# Patient Record
Sex: Female | Born: 1990 | Race: White | Hispanic: No
Health system: Midwestern US, Community
[De-identification: ages and names within clinical notes are randomized; demographics above are authoritative.]

## PROBLEM LIST (undated history)

## (undated) DIAGNOSIS — F419 Anxiety disorder, unspecified: Secondary | ICD-10-CM

## (undated) DIAGNOSIS — R569 Unspecified convulsions: Secondary | ICD-10-CM

## (undated) DIAGNOSIS — T7840XA Allergy, unspecified, initial encounter: Secondary | ICD-10-CM

## (undated) DIAGNOSIS — G43909 Migraine, unspecified, not intractable, without status migrainosus: Secondary | ICD-10-CM

## (undated) DIAGNOSIS — L309 Dermatitis, unspecified: Secondary | ICD-10-CM

## (undated) DIAGNOSIS — M797 Fibromyalgia: Secondary | ICD-10-CM

## (undated) DIAGNOSIS — F32A Depression, unspecified: Secondary | ICD-10-CM

## (undated) DIAGNOSIS — F909 Attention-deficit hyperactivity disorder, unspecified type: Secondary | ICD-10-CM

## (undated) DIAGNOSIS — F329 Major depressive disorder, single episode, unspecified: Secondary | ICD-10-CM

## (undated) DIAGNOSIS — I10 Essential (primary) hypertension: Secondary | ICD-10-CM

## (undated) HISTORY — PX: TONSILLECTOMY AND ADENOIDECTOMY: SHX28

## (undated) HISTORY — DX: Anxiety disorder, unspecified: F41.9

## (undated) HISTORY — DX: Fibromyalgia: M79.7

## (undated) HISTORY — DX: Attention-deficit hyperactivity disorder, unspecified type: F90.9

## (undated) HISTORY — DX: Unspecified convulsions: R56.9

## (undated) HISTORY — DX: Dermatitis, unspecified: L30.9

## (undated) HISTORY — PX: ESOPHAGOGASTRODUODENOSCOPY: SHX1529

## (undated) HISTORY — DX: Migraine, unspecified, not intractable, without status migrainosus: G43.909

## (undated) HISTORY — DX: Allergy, unspecified, initial encounter: T78.40XA

## (undated) HISTORY — DX: Major depressive disorder, single episode, unspecified: F32.9

## (undated) HISTORY — DX: Depression, unspecified: F32.A

---
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
## 2013-04-02 ENCOUNTER — Ambulatory Visit (INDEPENDENT_AMBULATORY_CARE_PROVIDER_SITE_OTHER): Payer: BC Managed Care – PPO | Admitting: Family Medicine

## 2013-04-02 ENCOUNTER — Ambulatory Visit: Payer: BC Managed Care – PPO

## 2013-04-02 VITALS — BP 110/60 | HR 84 | Temp 98.0°F | Resp 16 | Ht 58.5 in | Wt 106.4 lb

## 2013-04-02 DIAGNOSIS — R05 Cough: Secondary | ICD-10-CM

## 2013-04-02 DIAGNOSIS — R062 Wheezing: Secondary | ICD-10-CM

## 2013-04-02 DIAGNOSIS — R059 Cough, unspecified: Secondary | ICD-10-CM

## 2013-04-02 DIAGNOSIS — F17200 Nicotine dependence, unspecified, uncomplicated: Secondary | ICD-10-CM

## 2013-04-02 DIAGNOSIS — J209 Acute bronchitis, unspecified: Secondary | ICD-10-CM

## 2013-04-02 LAB — POCT URINE PREGNANCY: Preg Test, Ur: NEGATIVE

## 2013-04-02 MED ORDER — ALBUTEROL SULFATE HFA 108 (90 BASE) MCG/ACT IN AERS: 2.0000 | INHALATION_SPRAY | Freq: Four times a day (QID) | RESPIRATORY_TRACT | Status: DC | PRN

## 2013-04-02 MED ORDER — AZITHROMYCIN 250 MG PO TABS: ORAL_TABLET | ORAL | Status: DC

## 2013-04-02 MED ORDER — IPRATROPIUM BROMIDE 0.02 % IN SOLN: 0.5000 mg | Freq: Once | RESPIRATORY_TRACT | Status: DC

## 2013-04-02 MED ORDER — BENZONATATE 100 MG PO CAPS: 100.0000 mg | ORAL_CAPSULE | Freq: Two times a day (BID) | ORAL | Status: DC | PRN

## 2013-04-02 MED ORDER — HYDROCODONE-HOMATROPINE 5-1.5 MG/5ML PO SYRP: 5.0000 mL | ORAL_SOLUTION | Freq: Every evening | ORAL | Status: DC | PRN

## 2013-04-02 MED ORDER — ALBUTEROL SULFATE (2.5 MG/3ML) 0.083% IN NEBU: 2.5000 mg | INHALATION_SOLUTION | Freq: Once | RESPIRATORY_TRACT | Status: DC

## 2013-04-02 NOTE — Progress Notes (Signed)
[facility]:  Office Visit  Chief Complaint:  Chief Complaint  Patient presents with  . Cough    x 5 days  . Sore Throat    HPI: Misty Ross is a 22 y.o. female who is here for  5 day history of sore throat , chills, muscle aches, subjective fevers,  She is a long time smoker since the age of 88. She has  seasonal allergies, has taken cough drops and Delsyn without relief.  She has also taken sleep medicines but still wakes up coughing, Denies asthma. + rumbling in her chest. Nonproductive cough, just a lot of rumbling and rattles when she coughs. No facial pain, no ear pain, + rhinorrhea.   Past Medical History  Diagnosis Date  . Allergy   . Anxiety   . Depression   . Seizures    Past Surgical History  Procedure Laterality Date  . Tonsillectomy and adenoidectomy     History   Social History  . Marital Status: Single    Spouse Name: N/A    Number of Children: N/A  . Years of Education: N/A   Social History Main Topics  . Smoking status: Current Every Day Smoker  . Smokeless tobacco: Not on file  . Alcohol Use: Not on file  . Drug Use: Not on file  . Sexual Activity: Not on file   Other Topics Concern  . Not on file   Social History Narrative  . No narrative on file   Family History  Problem Relation Age of Onset  . Hyperlipidemia Father   . Cancer Maternal Grandmother   . Diabetes Maternal Grandmother   . Cancer Maternal Grandfather   . Heart disease Paternal Grandmother   . Cancer Paternal Grandfather    No Known Allergies Prior to Admission medications   Not on File     ROS: The patient denies fevers, chills, night sweats, unintentional weight loss, chest pain, palpitations, dyspnea on exertion, nausea, vomiting, abdominal pain, dysuria, hematuria, melena, numbness, weakness, or tingling.   All other systems have been reviewed and were otherwise negative with the exception of those mentioned in the HPI and as above.    PHYSICAL  EXAM: Filed Vitals:   04/02/13 1520  BP: 110/60  Pulse: 84  Temp: 98 F (36.7 C)  Resp: 16  Spo2 99% Filed Vitals:   04/02/13 1520  Height: 4' 10.5" (1.486 m)  Weight: 106 lb 6.4 oz (48.263 kg)   Body mass index is 21.86 kg/(m^2).  General: Alert, no acute distress HEENT:  Normocephalic, atraumatic, oropharynx patent. EOMI, PERRLA, TM nl, no exudates, nontender sinuses Cardiovascular:  Regular rate and rhythm, no rubs murmurs or gallops.  No Carotid bruits, radial pulse intact. No pedal edema.  Respiratory: Clear to auscultation bilaterally.  No wheezes, rales, + rhonchi.  No cyanosis, no use of accessory musculature GI: No organomegaly, abdomen is soft and non-tender, positive bowel sounds.  No masses. Skin: No rashes. Neurologic: Facial musculature symmetric. Psychiatric: Patient is appropriate throughout our interaction. Lymphatic: No cervical lymphadenopathy Musculoskeletal: Gait intact.   LABS: Results for orders placed in visit on 04/02/13  POCT URINE PREGNANCY      Result Value Range   Preg Test, Ur Negative       EKG/XRAY:   Primary read interpreted by Dr. Conley Rolls at [facility]. No pneumonia, effusion Increase vascular markings  ASSESSMENT/PLAN: Encounter Diagnoses  Name Primary?  . Cough Yes  . Acute bronchitis   . Wheezing  Rx Azithromycin Rx Albuterol inhaler MDI, no prednisone for now, she has more rhoncherous BS than actual wheezing  Rx Hycodan and tessalon perles Advise to stop smoking Take otc Anithistamine with decongestant  F/u prn  Gross sideeffects, risk and benefits, and alternatives of medications d/w patient. Patient is aware that all medications have potential sideeffects and we are unable to predict every sideeffect or drug-drug interaction that may occur.  Hamilton Capri PHUONG, DO 04/02/2013 4:50 PM

## 2013-04-02 NOTE — Patient Instructions (Signed)
Acute Bronchitis Bronchitis is inflammation of the airways that extend from the windpipe into the lungs (bronchi). The inflammation often causes mucus to develop. This leads to a cough, which is the most common symptom of bronchitis.  In acute bronchitis, the condition usually develops suddenly and goes away over time, usually in a couple weeks. Smoking, allergies, and asthma can make bronchitis worse. Repeated episodes of bronchitis may cause further lung problems.  CAUSES Acute bronchitis is most often caused by the same virus that causes a cold. The virus can spread from person to person (contagious).  SIGNS AND SYMPTOMS   Cough.   Fever.   Coughing up mucus.   Body aches.   Chest congestion.   Chills.   Shortness of breath.   Sore throat.  DIAGNOSIS  Acute bronchitis is usually diagnosed through a physical exam. Tests, such as chest X-rays, are sometimes done to rule out other conditions.  TREATMENT  Acute bronchitis usually goes away in a couple weeks. Often times, no medical treatment is necessary. Medicines are sometimes given for relief of fever or cough. Antibiotics are usually not needed but may be prescribed in certain situations. In some cases, an inhaler may be recommended to help reduce shortness of breath and control the cough. A cool mist vaporizer may also be used to help thin bronchial secretions and make it easier to clear the chest.  HOME CARE INSTRUCTIONS  Get plenty of rest.   Drink enough fluids to keep your urine clear or pale yellow (unless you have a medical condition that requires fluid restriction). Increasing fluids may help thin your secretions and will prevent dehydration.   Only take over-the-counter or prescription medicines as directed by your health care provider.   Avoid smoking and secondhand smoke. Exposure to cigarette smoke or irritating chemicals will make bronchitis worse. If you are a smoker, consider using nicotine gum or skin  patches to help control withdrawal symptoms. Quitting smoking will help your lungs heal faster.   Reduce the chances of another bout of acute bronchitis by washing your hands frequently, avoiding people with cold symptoms, and trying not to touch your hands to your mouth, nose, or eyes.   Follow up with your health care provider as directed.  SEEK MEDICAL CARE IF: Your symptoms do not improve after 1 week of treatment.  SEEK IMMEDIATE MEDICAL CARE IF:  You develop an increased fever or chills.   You have chest pain.   You have severe shortness of breath.  You have bloody sputum.   You develop dehydration.  You develop fainting.  You develop repeated vomiting.  You develop a severe headache. MAKE SURE YOU:   Understand these instructions.  Will watch your condition.  Will get help right away if you are not doing well or get worse. Document Released: 06/15/2004 Document Revised: 01/08/2013 Document Reviewed: 10/29/2012 [employer] Patient Information 2014 [employer].  

## 2013-04-03 ENCOUNTER — Encounter: Payer: Self-pay | Admitting: Family Medicine

## 2013-04-15 ENCOUNTER — Emergency Department (HOSPITAL_COMMUNITY)
Admission: EM | Admit: 2013-04-15 | Discharge: 2013-04-16 | Disposition: A | Discharge status: Home / Self Care | Payer: BC Managed Care – PPO | Attending: Emergency Medicine | Admitting: Emergency Medicine

## 2013-04-15 ENCOUNTER — Encounter (HOSPITAL_COMMUNITY): Payer: Self-pay | Admitting: Emergency Medicine

## 2013-04-15 DIAGNOSIS — T148XXA Other injury of unspecified body region, initial encounter: Secondary | ICD-10-CM

## 2013-04-15 DIAGNOSIS — F3289 Other specified depressive episodes: Secondary | ICD-10-CM | POA: Insufficient documentation

## 2013-04-15 DIAGNOSIS — Y9389 Activity, other specified: Secondary | ICD-10-CM | POA: Insufficient documentation

## 2013-04-15 DIAGNOSIS — F329 Major depressive disorder, single episode, unspecified: Secondary | ICD-10-CM | POA: Insufficient documentation

## 2013-04-15 DIAGNOSIS — R5381 Other malaise: Secondary | ICD-10-CM | POA: Insufficient documentation

## 2013-04-15 DIAGNOSIS — F411 Generalized anxiety disorder: Secondary | ICD-10-CM | POA: Insufficient documentation

## 2013-04-15 DIAGNOSIS — F17200 Nicotine dependence, unspecified, uncomplicated: Secondary | ICD-10-CM | POA: Insufficient documentation

## 2013-04-15 DIAGNOSIS — Z79899 Other long term (current) drug therapy: Secondary | ICD-10-CM | POA: Insufficient documentation

## 2013-04-15 DIAGNOSIS — S5010XA Contusion of unspecified forearm, initial encounter: Secondary | ICD-10-CM | POA: Insufficient documentation

## 2013-04-15 DIAGNOSIS — R51 Headache: Secondary | ICD-10-CM | POA: Insufficient documentation

## 2013-04-15 DIAGNOSIS — Y92410 Unspecified street and highway as the place of occurrence of the external cause: Secondary | ICD-10-CM | POA: Insufficient documentation

## 2013-04-15 DIAGNOSIS — S060X0A Concussion without loss of consciousness, initial encounter: Secondary | ICD-10-CM | POA: Insufficient documentation
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
## 2013-04-17 NOTE — ED Provider Notes (Signed)
Medical screening examination/treatment/procedure(s) were performed by non-physician practitioner and as supervising physician I was immediately available for consultation/collaboration.  EKG Interpretation   None         Candyce Churn, MD 04/17/13 2032

## 2013-07-12 ENCOUNTER — Ambulatory Visit (INDEPENDENT_AMBULATORY_CARE_PROVIDER_SITE_OTHER): Payer: BC Managed Care – PPO | Admitting: Family Medicine

## 2013-07-12 VITALS — BP 108/82 | HR 102 | Temp 98.2°F | Resp 18 | Ht 59.0 in | Wt 102.4 lb

## 2013-07-12 DIAGNOSIS — R05 Cough: Secondary | ICD-10-CM

## 2013-07-12 DIAGNOSIS — J111 Influenza due to unidentified influenza virus with other respiratory manifestations: Secondary | ICD-10-CM

## 2013-07-12 DIAGNOSIS — R059 Cough, unspecified: Secondary | ICD-10-CM

## 2013-07-12 DIAGNOSIS — J310 Chronic rhinitis: Secondary | ICD-10-CM

## 2013-07-12 MED ORDER — IPRATROPIUM BROMIDE 0.02 % IN SOLN
0.5000 mg | Freq: Once | RESPIRATORY_TRACT | Status: DC
Start: 2013-07-12 — End: 2014-05-26

## 2013-07-12 MED ORDER — HYDROCODONE-HOMATROPINE 5-1.5 MG/5ML PO SYRP: 5.0000 mL | ORAL_SOLUTION | ORAL | Status: DC | PRN

## 2013-07-12 MED ORDER — OSELTAMIVIR PHOSPHATE 75 MG PO CAPS: 75.0000 mg | ORAL_CAPSULE | Freq: Two times a day (BID) | ORAL | Status: DC

## 2013-07-12 NOTE — Progress Notes (Signed)
Subjective:   23 year old lady who has been sick since yesterday. She got a bad sore throat and runny nose cough and body aches and just feels lousy. She may or may not have had fever. She did not have a flu shot.  Objective: Ill appearing. TMs normal. Throat clear. Neck supple without nodes. Chest clear to auscultation. Heart regular without murmurs. She has constant sniffles and some cough.  Assessment: Influenza Cough Rhinorrhea  Plan:  Tamiflu Hycodan cough syrup At the nasal Return as needed Off work for a couple of days

## 2013-07-12 NOTE — Patient Instructions (Signed)
Drink plenty of fluids and get enough rest  Use the Atrovent nasal nose spray 2 sprays each nostril 4 times daily as needed for nasal congestion  Take the cough syrup 1 teaspoon every 4-6 hours as needed for cough  Take the Tamiflu one twice daily for influenza infection  Return if worse

## 2013-07-15 ENCOUNTER — Ambulatory Visit (INDEPENDENT_AMBULATORY_CARE_PROVIDER_SITE_OTHER): Payer: BC Managed Care – PPO | Admitting: Family Medicine

## 2013-07-15 VITALS — BP 100/70 | HR 91 | Temp 97.8°F | Resp 18 | Ht 59.0 in | Wt 106.4 lb

## 2013-07-15 DIAGNOSIS — F909 Attention-deficit hyperactivity disorder, unspecified type: Secondary | ICD-10-CM

## 2013-07-15 MED ORDER — AMPHETAMINE-DEXTROAMPHETAMINE 10 MG PO TABS: 10.0000 mg | ORAL_TABLET | Freq: Every day | ORAL | Status: DC

## 2013-07-15 MED ORDER — LISDEXAMFETAMINE DIMESYLATE 30 MG PO CAPS: 30.0000 mg | ORAL_CAPSULE | Freq: Every day | ORAL | Status: DC

## 2013-07-15 NOTE — Progress Notes (Signed)
Subjective:  This chart was scribed for Nilda Simmer, MD by Carl Best, Medical Scribe. This patient was seen in Room 10 and the patient's care was started at 6:40 PM.   Patient ID: Misty Ross, female    DOB: Dec 24, 1990, 23 y.o.   MRN: [id number]  HPI HPI Comments: Misty Ross is a 23 y.o. female with a history of Anxiety and Depression and ADHD who presents to the [facility] needing a refill for her Vyvanse and Adderall.  The patient states that she was unable to see her psychiatrist this week and was not able to refill her medications.  The patient's psychiatrist is Dr. Saul Fordyce.  She states that she sees her for Insomnia and ADHD.  She states that she was diagnosed with ADHD when she was 23 years old.  She states that she is comfortable with her current dosage of Adderall and Vyvanse.  The patient's Vyvanse and Adderall were last filled on 06/05/2013.  She states that she takes the Adderall in the evening.  She states that she is not on any medication for anxiety or depression at the current moment.  She denies experiencing any major depressive episodes lately and states that she talks to her psychiatrist regularly.  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  She also takes Clonidine for insomnia but remaining refills on this medication. She was to see psychiatrist today but appointment was cancelled by psychiatry office due to physician being ill.  Appointment is not until next week.   Past Medical History  Diagnosis Date  . Allergy   . Anxiety   . Depression   . Seizures   . ADHD (attention deficit hyperactivity disorder)    Past Surgical History  Procedure Laterality Date  . Tonsillectomy and adenoidectomy     Family History  Problem Relation Age of Onset  . Hyperlipidemia Father   . Cancer Maternal Grandmother   . Diabetes Maternal Grandmother   . Cancer Maternal Grandfather   . Heart disease Paternal Grandmother   . Cancer  Paternal Grandfather    History   Social History  . Marital Status: Single    Spouse Name: N/A    Number of Children: N/A  . Years of Education: N/A   Occupational History  . Not on file.   Social History Main Topics  . Smoking status: Current Every Day Smoker -- 0.50 packs/day    Types: Cigarettes  . Smokeless tobacco: Not on file  . Alcohol Use: Yes     Comment: one beer per day  . Drug Use: Not on file  . Sexual Activity: Not on file   Other Topics Concern  . Not on file   Social History Narrative  . No narrative on file   No Known Allergies  Review of Systems  Respiratory: Negative for shortness of breath.   Cardiovascular: Negative for palpitations.  Neurological: Negative for headaches.  Psychiatric/Behavioral: Positive for sleep disturbance and decreased concentration. Negative for suicidal ideas, self-injury, dysphoric mood and agitation. The patient is not nervous/anxious.      Objective:  Physical Exam  Nursing note and vitals reviewed. Constitutional: She is oriented to person, place, and time. She appears well-developed and well-nourished.  HENT:  Head: Normocephalic and atraumatic.  Eyes: EOM are normal.  Neck: Normal range of motion.  Cardiovascular: Normal rate, regular rhythm and normal heart sounds.   No murmur heard. Pulmonary/Chest: Effort normal and breath sounds normal.  No respiratory distress. She has no wheezes. She has no rales.  Musculoskeletal: Normal range of motion.  Neurological: She is alert and oriented to person, place, and time.  Skin: Skin is warm and dry.  Psychiatric: Her speech is normal and behavior is normal. Judgment and thought content normal. Her mood appears anxious. Cognition and memory are normal.    BP 100/70  Pulse 91  Temp(Src) 97.8 F (36.6 C) (Oral)  Resp 18  Ht 4\' 11"  (1.499 m)  Wt 106 lb 6.4 oz (48.263 kg)  BMI 21.48 kg/m2  SpO2 97%  LMP 06/19/2013 Assessment & Plan:  ADHD (attention deficit  hyperactivity disorder)  1. ADHD:  New to this provider. Managed by psychiatry/[facility].  Advised patient that it is not our usual office policy to prescribe controlled substances to patients who receive medications from other health care providers; agreed to refill Vyvanse and Adderall one time only.  We will not provide with further refills in the future.  Patient expressed understanding.    Meds ordered this encounter  Medications  . lisdexamfetamine (VYVANSE) 30 MG capsule    Sig: Take 1 capsule (30 mg total) by mouth daily.    Dispense:  30 capsule    Refill:  0  . amphetamine-dextroamphetamine (ADDERALL) 10 MG tablet    Sig: Take 1 tablet (10 mg total) by mouth daily with breakfast.    Dispense:  30 tablet    Refill:  0    I personally performed the services described in this documentation, which was scribed in my presence. The recorded information has been reviewed and is accurate.  Nilda SimmerKristi Smith, M.D.  [facility]  [place] [address] [place], [place]  [id number] [phone] phone [phone] fax

## 2013-07-18 ENCOUNTER — Telehealth: Payer: Self-pay

## 2013-07-18 DIAGNOSIS — R05 Cough: Secondary | ICD-10-CM

## 2013-07-18 DIAGNOSIS — R059 Cough, unspecified: Secondary | ICD-10-CM

## 2013-07-18 NOTE — Telephone Encounter (Signed)
Patient was told by her doctor to call and remind him to write her cough medication prescription.   She can come and pick it up.   [phone]

## 2013-07-18 NOTE — Telephone Encounter (Signed)
Pt is in the office and would like to authorize for her boyfriend Teodoro SprayJacob Young to pick rx up.

## 2013-07-19 MED ORDER — HYDROCODONE-HOMATROPINE 5-1.5 MG/5ML PO SYRP: 5.0000 mL | ORAL_SOLUTION | Freq: Four times a day (QID) | ORAL | Status: DC | PRN

## 2013-07-19 NOTE — Telephone Encounter (Signed)
The original message was supposed to go to Dr. Alwyn RenHopper.  She is in dire need for something to stop her cough at night.  She says it has been days since she has slept.  Says this was discussed with Dr. Alwyn RenHopper on 07-12-13.  She is having to work and is having a hard time with no sleep.  Please call asap [phone]

## 2013-07-19 NOTE — Telephone Encounter (Signed)
Dr. Alwyn RenHopper not in office; refill of Hycodan provided for patient.

## 2014-03-31 ENCOUNTER — Emergency Department (HOSPITAL_COMMUNITY)
Admission: EM | Admit: 2014-03-31 | Discharge: 2014-03-31 | Disposition: A | Discharge status: Home / Self Care | Payer: BC Managed Care – PPO | Attending: Emergency Medicine | Admitting: Emergency Medicine

## 2014-03-31 ENCOUNTER — Encounter (HOSPITAL_COMMUNITY): Payer: Self-pay | Admitting: *Deleted

## 2014-03-31 ENCOUNTER — Emergency Department (HOSPITAL_COMMUNITY): Payer: BC Managed Care – PPO

## 2014-03-31 DIAGNOSIS — Y9289 Other specified places as the place of occurrence of the external cause: Secondary | ICD-10-CM | POA: Insufficient documentation

## 2014-03-31 DIAGNOSIS — F329 Major depressive disorder, single episode, unspecified: Secondary | ICD-10-CM | POA: Diagnosis not present

## 2014-03-31 DIAGNOSIS — R000 Tachycardia, unspecified: Secondary | ICD-10-CM | POA: Insufficient documentation

## 2014-03-31 DIAGNOSIS — Z720 Tobacco use: Secondary | ICD-10-CM | POA: Insufficient documentation

## 2014-03-31 DIAGNOSIS — T401X1A Poisoning by heroin, accidental (unintentional), initial encounter: Secondary | ICD-10-CM | POA: Diagnosis not present

## 2014-03-31 DIAGNOSIS — Z79899 Other long term (current) drug therapy: Secondary | ICD-10-CM | POA: Insufficient documentation

## 2014-03-31 DIAGNOSIS — F909 Attention-deficit hyperactivity disorder, unspecified type: Secondary | ICD-10-CM | POA: Insufficient documentation

## 2014-03-31 DIAGNOSIS — F419 Anxiety disorder, unspecified: Secondary | ICD-10-CM | POA: Diagnosis not present

## 2014-03-31 DIAGNOSIS — Y9389 Activity, other specified: Secondary | ICD-10-CM | POA: Insufficient documentation

## 2014-03-31 DIAGNOSIS — Y998 Other external cause status: Secondary | ICD-10-CM | POA: Diagnosis not present

## 2014-03-31 MED ORDER — NALOXONE HCL 1 MG/ML IJ SOLN
1.0000 mg | Freq: Once | INTRAMUSCULAR | Status: AC
  Administered 2014-03-31: 1 mg via INTRAVENOUS

## 2014-03-31 NOTE — ED Notes (Signed)
Pt requesting to leave, pt just ripped 1 IV out. md made aware and at bedside. 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

## 2014-03-31 NOTE — ED Provider Notes (Signed)
CSN: [id number]     Arrival date & time 03/31/14  [id number] History   First MD Initiated Contact with Patient 03/31/14 1827     Chief Complaint  Patient presents with  . Drug Overdose     HPI Comments: [name] presents for heroin overdose.  She was brought in unresponsive by her brother. Patient reports taking heroin today. She states it was just a little bit. She is a daily user of heroin. She denies suicidal thoughts or suicide attempts. She denies prior overdose.symptoms severe, constant and worsening.  Patient is a 23 y.o. female presenting with Overdose. The history is provided by a friend.  Drug Overdose    Past Medical History  Diagnosis Date  . Allergy   . Anxiety   . Depression   . Seizures   . ADHD (attention deficit hyperactivity disorder)    Past Surgical History  Procedure Laterality Date  . Tonsillectomy and adenoidectomy     Family History  Problem Relation Age of Onset  . Hyperlipidemia Father   . Cancer Maternal Grandmother   . Diabetes Maternal Grandmother   . Cancer Maternal Grandfather   . Heart disease Paternal Grandmother   . Cancer Paternal Grandfather    History  Substance Use Topics  . Smoking status: Current Every Day Smoker -- 0.50 packs/day    Types: Cigarettes  . Smokeless tobacco: Not on file  . Alcohol Use: Yes     Comment: one beer per day   OB History    No data available     Review of Systems  All other systems reviewed and are negative.     Allergies  Review of patient's allergies indicates no known allergies.  Home Medications   Prior to Admission medications   Medication Sig Start Date End Date Taking? Authorizing Provider  amphetamine-dextroamphetamine (ADDERALL) 10 MG tablet Take 1 tablet (10 mg total) by mouth daily with breakfast. 07/15/13   Ethelda ChickKristi M Smith, MD  CLONIDINE HCL PO Take 0.4 mg by mouth daily.     Historical Provider, MD  HYDROcodone-homatropine (HYCODAN) 5-1.5 MG/5ML syrup Take 5 mLs by mouth every 6 (six)  hours as needed for cough. 07/19/13   Ethelda ChickKristi M Smith, MD  lisdexamfetamine (VYVANSE) 30 MG capsule Take 1 capsule (30 mg total) by mouth daily. 07/15/13   Ethelda ChickKristi M Smith, MD  oseltamivir (TAMIFLU) 75 MG capsule Take 1 capsule (75 mg total) by mouth 2 (two) times daily. 07/12/13   Peyton Najjaravid H Hopper, MD   BP 147/78 mmHg  Pulse 143  Temp(Src) 99.1 F (37.3 C) (Oral)  Resp 13  SpO2 100% Physical Exam  Constitutional: She appears well-developed.  Eyes:  Pupils pinpoint bilaterally  Cardiovascular:  Tachycardic, no murmur  Pulmonary/Chest:  apneic  Abdominal: Soft. There is no tenderness.  Musculoskeletal: She exhibits no edema or tenderness.  Neurological:  Unresponsive. GCS 1-1-1  Skin: Skin is warm.  Cyanosis of face and chest  Psychiatric:  Unable to assess  Nursing note and vitals reviewed.   ED Course  Procedures (including critical care time)CRITICAL CARE Performed by: Tilden FossaEES, Valerya Maxton   Total critical care time: 30 minutes  Critical care time was exclusive of separately billable procedures and treating other patients.  Critical care was necessary to treat or prevent imminent or life-threatening deterioration.  Critical care was time spent personally by me on the following activities: development of treatment plan with patient and/or surrogate as well as nursing, discussions with consultants, evaluation of patient's response to treatment, examination  of patient, obtaining history from patient or surrogate, ordering and performing treatments and interventions, ordering and review of laboratory studies, ordering and review of radiographic studies, pulse oximetry and re-evaluation of patient's condition. Labs Review  Labs Reviewed - No data to display  Imaging Review Dg Chest Port 1 View  03/31/2014   CLINICAL DATA:  Drug overdose. Smoker without other cardiopulmonary history.  EXAM: PORTABLE CHEST - 1 VIEW  COMPARISON:  04/02/2013  FINDINGS: Midline trachea.  Normal heart size  and mediastinal contours.  Sharp costophrenic angles. No pneumothorax. Clear lungs. Numerous leads and wires project over the chest.  IMPRESSION: No active disease.   Electronically Signed   By: Jeronimo GreavesKyle  Talbot M.D.   On: 03/31/2014 18:55     EKG Interpretation None      Patient presented with apnea following heroine overdose. Patient was unresponsive upon ED arrival with minimal respiratory effort. Patient was started with bag-valve-mask ventilation pending IV placements for Narcan administration.  He had improvement in her oxygenation and ventilatory status with packing and following Narcan patient became responsive with good respiratory effort. Following Narcan patient denied any complaints on repeat exam she had good air movement bilaterally with no wheezes or rhonchi. Discussed with patient importance of monitoring to evaluate for recurrent respiratory compromise.  MDM   Final diagnoses:  Heroin overdose, accidental or unintentional, initial encounter    Patient presented apneic, unresponsive, and hypoxic following heroin overdose. Patient responded well to Narcan and was observed in the emergency department. She had no additional apneic or lethargic episodes. She denied suicidal intent or additional ingestants. She declined referral to detox. Discussed with patient heroin overdose and need for continued monitoring at home. Patient does have Narcan available at home.    Tilden FossaElizabeth Brixon Zhen, MD 04/01/14 [phone]

## 2014-03-31 NOTE — Discharge Instructions (Signed)
Narcotic Overdose  A narcotic overdose is the misuse or overuse of a narcotic drug. A narcotic overdose can make you pass out and stop breathing. If you are not treated right away, this can cause permanent brain damage or stop your heart. Medicine may be given to reverse the effects of an overdose. If so, this medicine may bring on withdrawal symptoms. The symptoms may be abdominal cramps, throwing up (vomiting), sweating, chills, and nervousness.  Injecting narcotics can cause more problems than just an overdose. AIDS, hepatitis, and other very serious infections are transmitted by sharing needles and syringes. If you decide to quit using, there are medicines which can help you through the withdrawal period. Trying to quit all at once on your own can be uncomfortable, but not life-threatening. Call your caregiver, Narcotics Anonymous, or any drug and alcohol treatment program for further help.   Document Released: 06/15/2004 Document Revised: 07/31/2011 Document Reviewed: 04/09/2009  ExitCare® Patient Information ©2015 [employer]. This information is not intended to replace advice given to you by your health care provider. Make sure you discuss any questions you have with your health care provider.

## 2014-03-31 NOTE — ED Notes (Addendum)
Father at bedside.

## 2014-03-31 NOTE — ED Notes (Signed)
Pt given a gingerale

## 2014-03-31 NOTE — ED Notes (Signed)
Patient's father at the bedside Patient is telling father that she is here because she "had a seizure" Patient not disclosing reason why she was brought to ED and does not want her father to be aware

## 2014-03-31 NOTE — ED Notes (Signed)
Pt was brought in my Engineer, materialssecurity officer in wheelchair. Pt face was turning blue, pts family member reported pt does heroine. Pt rushed to Res B, IVs placed, verbal order for narcan given. Respirations 1/min, pt was bagged. Pt given 1mg  narcan. arousable within minutes. Vital signs returned to normal.

## 2014-05-22 ENCOUNTER — Inpatient Hospital Stay (HOSPITAL_COMMUNITY)
Admission: EM | Admit: 2014-05-22 | Discharge: 2014-05-26 | DRG: 917 | Disposition: A | Discharge status: Other Acute Inpatient Hospital | Payer: BC Managed Care – PPO | Attending: Pulmonary Disease | Admitting: Pulmonary Disease

## 2014-05-22 ENCOUNTER — Encounter (HOSPITAL_COMMUNITY): Payer: Self-pay | Admitting: *Deleted

## 2014-05-22 DIAGNOSIS — F1120 Opioid dependence, uncomplicated: Secondary | ICD-10-CM | POA: Diagnosis present

## 2014-05-22 DIAGNOSIS — R4781 Slurred speech: Secondary | ICD-10-CM | POA: Diagnosis not present

## 2014-05-22 DIAGNOSIS — J690 Pneumonitis due to inhalation of food and vomit: Secondary | ICD-10-CM | POA: Diagnosis present

## 2014-05-22 DIAGNOSIS — F909 Attention-deficit hyperactivity disorder, unspecified type: Secondary | ICD-10-CM | POA: Diagnosis present

## 2014-05-22 DIAGNOSIS — Z609 Problem related to social environment, unspecified: Secondary | ICD-10-CM | POA: Diagnosis present

## 2014-05-22 DIAGNOSIS — T43592A Poisoning by other antipsychotics and neuroleptics, intentional self-harm, initial encounter: Secondary | ICD-10-CM | POA: Diagnosis present

## 2014-05-22 DIAGNOSIS — F17210 Nicotine dependence, cigarettes, uncomplicated: Secondary | ICD-10-CM | POA: Diagnosis present

## 2014-05-22 DIAGNOSIS — Z23 Encounter for immunization: Secondary | ICD-10-CM

## 2014-05-22 DIAGNOSIS — F1910 Other psychoactive substance abuse, uncomplicated: Secondary | ICD-10-CM

## 2014-05-22 DIAGNOSIS — T401X2A Poisoning by heroin, intentional self-harm, initial encounter: Principal | ICD-10-CM | POA: Diagnosis present

## 2014-05-22 DIAGNOSIS — G9340 Encephalopathy, unspecified: Secondary | ICD-10-CM

## 2014-05-22 DIAGNOSIS — F418 Other specified anxiety disorders: Secondary | ICD-10-CM | POA: Diagnosis present

## 2014-05-22 DIAGNOSIS — G92 Toxic encephalopathy: Secondary | ICD-10-CM | POA: Diagnosis present

## 2014-05-22 DIAGNOSIS — R509 Fever, unspecified: Secondary | ICD-10-CM

## 2014-05-22 DIAGNOSIS — Z820 Family history of epilepsy and other diseases of the nervous system: Secondary | ICD-10-CM

## 2014-05-22 DIAGNOSIS — J9600 Acute respiratory failure, unspecified whether with hypoxia or hypercapnia: Secondary | ICD-10-CM | POA: Diagnosis present

## 2014-05-22 DIAGNOSIS — R45851 Suicidal ideations: Secondary | ICD-10-CM | POA: Diagnosis present

## 2014-05-22 DIAGNOSIS — T50902A Poisoning by unspecified drugs, medicaments and biological substances, intentional self-harm, initial encounter: Secondary | ICD-10-CM

## 2014-05-22 DIAGNOSIS — F1410 Cocaine abuse, uncomplicated: Secondary | ICD-10-CM | POA: Diagnosis present

## 2014-05-22 DIAGNOSIS — T405X2A Poisoning by cocaine, intentional self-harm, initial encounter: Secondary | ICD-10-CM | POA: Diagnosis present

## 2014-05-22 DIAGNOSIS — Z8249 Family history of ischemic heart disease and other diseases of the circulatory system: Secondary | ICD-10-CM

## 2014-05-22 DIAGNOSIS — Z599 Problem related to housing and economic circumstances, unspecified: Secondary | ICD-10-CM

## 2014-05-22 DIAGNOSIS — J189 Pneumonia, unspecified organism: Secondary | ICD-10-CM

## 2014-05-22 DIAGNOSIS — E869 Volume depletion, unspecified: Secondary | ICD-10-CM | POA: Diagnosis present

## 2014-05-22 DIAGNOSIS — Z833 Family history of diabetes mellitus: Secondary | ICD-10-CM

## 2014-05-22 DIAGNOSIS — J9601 Acute respiratory failure with hypoxia: Secondary | ICD-10-CM | POA: Diagnosis present

## 2014-05-22 DIAGNOSIS — T1491XA Suicide attempt, initial encounter: Secondary | ICD-10-CM | POA: Diagnosis present

## 2014-05-22 NOTE — ED Notes (Signed)
Per EMS pt took 6000 mg extended release seroquel  to get high, denies SI; her mother called ambulance; has hx of substance abuse

## 2014-05-22 NOTE — ED Notes (Signed)
Bed: WA21 Expected date:  Expected time:  Means of arrival:  Comments: EMS 24 yo took seroquel

## 2014-05-23 ENCOUNTER — Inpatient Hospital Stay (HOSPITAL_COMMUNITY): Payer: BC Managed Care – PPO

## 2014-05-23 ENCOUNTER — Encounter (HOSPITAL_COMMUNITY): Payer: Self-pay | Admitting: Internal Medicine

## 2014-05-23 DIAGNOSIS — G9340 Encephalopathy, unspecified: Secondary | ICD-10-CM | POA: Diagnosis present

## 2014-05-23 DIAGNOSIS — R4781 Slurred speech: Secondary | ICD-10-CM | POA: Diagnosis present

## 2014-05-23 DIAGNOSIS — F418 Other specified anxiety disorders: Secondary | ICD-10-CM | POA: Diagnosis present

## 2014-05-23 DIAGNOSIS — T43592A Poisoning by other antipsychotics and neuroleptics, intentional self-harm, initial encounter: Secondary | ICD-10-CM | POA: Diagnosis present

## 2014-05-23 DIAGNOSIS — Z599 Problem related to housing and economic circumstances, unspecified: Secondary | ICD-10-CM | POA: Diagnosis not present

## 2014-05-23 DIAGNOSIS — Z820 Family history of epilepsy and other diseases of the nervous system: Secondary | ICD-10-CM | POA: Diagnosis not present

## 2014-05-23 DIAGNOSIS — F17210 Nicotine dependence, cigarettes, uncomplicated: Secondary | ICD-10-CM | POA: Diagnosis present

## 2014-05-23 DIAGNOSIS — F1910 Other psychoactive substance abuse, uncomplicated: Secondary | ICD-10-CM | POA: Diagnosis present

## 2014-05-23 DIAGNOSIS — G92 Toxic encephalopathy: Secondary | ICD-10-CM | POA: Diagnosis present

## 2014-05-23 DIAGNOSIS — E869 Volume depletion, unspecified: Secondary | ICD-10-CM | POA: Diagnosis present

## 2014-05-23 DIAGNOSIS — R45851 Suicidal ideations: Secondary | ICD-10-CM

## 2014-05-23 DIAGNOSIS — Z23 Encounter for immunization: Secondary | ICD-10-CM | POA: Diagnosis not present

## 2014-05-23 DIAGNOSIS — T405X2A Poisoning by cocaine, intentional self-harm, initial encounter: Secondary | ICD-10-CM | POA: Diagnosis present

## 2014-05-23 DIAGNOSIS — J9600 Acute respiratory failure, unspecified whether with hypoxia or hypercapnia: Secondary | ICD-10-CM | POA: Diagnosis not present

## 2014-05-23 DIAGNOSIS — Z609 Problem related to social environment, unspecified: Secondary | ICD-10-CM | POA: Diagnosis present

## 2014-05-23 DIAGNOSIS — Z833 Family history of diabetes mellitus: Secondary | ICD-10-CM | POA: Diagnosis not present

## 2014-05-23 DIAGNOSIS — J690 Pneumonitis due to inhalation of food and vomit: Secondary | ICD-10-CM | POA: Diagnosis not present

## 2014-05-23 DIAGNOSIS — J9601 Acute respiratory failure with hypoxia: Secondary | ICD-10-CM

## 2014-05-23 DIAGNOSIS — Z8249 Family history of ischemic heart disease and other diseases of the circulatory system: Secondary | ICD-10-CM | POA: Diagnosis not present

## 2014-05-23 DIAGNOSIS — T401X2A Poisoning by heroin, intentional self-harm, initial encounter: Secondary | ICD-10-CM | POA: Diagnosis present

## 2014-05-23 DIAGNOSIS — F1120 Opioid dependence, uncomplicated: Secondary | ICD-10-CM | POA: Diagnosis present

## 2014-05-23 DIAGNOSIS — T50902A Poisoning by unspecified drugs, medicaments and biological substances, intentional self-harm, initial encounter: Secondary | ICD-10-CM | POA: Diagnosis present

## 2014-05-23 DIAGNOSIS — T1491XA Suicide attempt, initial encounter: Secondary | ICD-10-CM | POA: Diagnosis present

## 2014-05-23 DIAGNOSIS — F1410 Cocaine abuse, uncomplicated: Secondary | ICD-10-CM | POA: Diagnosis present

## 2014-05-23 DIAGNOSIS — F909 Attention-deficit hyperactivity disorder, unspecified type: Secondary | ICD-10-CM | POA: Diagnosis present

## 2014-05-23 LAB — SALICYLATE LEVEL

## 2014-05-23 LAB — BLOOD GAS, ARTERIAL
ACID-BASE EXCESS: 0 mmol/L (ref 0.0–2.0)
Acid-Base Excess: 1.7 mmol/L (ref 0.0–2.0)
Bicarbonate: 24.6 mEq/L — ABNORMAL HIGH (ref 20.0–24.0)
Bicarbonate: 24 mEq/L (ref 20.0–24.0)
DRAWN BY: 232811
FIO2: 0.21 %
FIO2: 1 %
MECHVT: 420 mL
O2 SAT: 98.3 %
O2 SAT: 96.4 %
PATIENT TEMPERATURE: 37
PCO2 ART: 34 mmHg — AB (ref 35.0–45.0)
PEEP: 5 cmH2O
PO2 ART: 105 mmHg — AB (ref 80.0–100.0)
Patient temperature: 98
RATE: 14 resp/min
TCO2: 21.9 mmol/L (ref 0–100)
TCO2: 22.1 mmol/L (ref 0–100)
pCO2 arterial: 38.7 mmHg (ref 35.0–45.0)
pH, Arterial: 7.471 — ABNORMAL HIGH (ref 7.350–7.450)
pH, Arterial: 7.41 (ref 7.350–7.450)
pO2, Arterial: 79.4 mmHg — ABNORMAL LOW (ref 80.0–100.0)

## 2014-05-23 LAB — URINALYSIS, ROUTINE W REFLEX MICROSCOPIC
Bilirubin Urine: NEGATIVE
GLUCOSE, UA: NEGATIVE mg/dL
Hgb urine dipstick: NEGATIVE
Ketones, ur: NEGATIVE mg/dL
LEUKOCYTES UA: NEGATIVE
NITRITE: NEGATIVE
Protein, ur: NEGATIVE mg/dL
SPECIFIC GRAVITY, URINE: 1.015 (ref 1.005–1.030)
Urobilinogen, UA: 0.2 mg/dL (ref 0.0–1.0)
pH: 5 (ref 5.0–8.0)

## 2014-05-23 LAB — RAPID URINE DRUG SCREEN, HOSP PERFORMED
AMPHETAMINES: NOT DETECTED
BENZODIAZEPINES: POSITIVE — AB
Barbiturates: NOT DETECTED
Cocaine: POSITIVE — AB
OPIATES: POSITIVE — AB
Tetrahydrocannabinol: POSITIVE — AB

## 2014-05-23 LAB — CBC WITH DIFFERENTIAL/PLATELET
BASOS ABS: 0 10*3/uL (ref 0.0–0.1)
Basophils Relative: 0 % (ref 0–1)
EOS ABS: 0.1 10*3/uL (ref 0.0–0.7)
Eosinophils Relative: 2 % (ref 0–5)
HCT: 39.4 % (ref 36.0–46.0)
Hemoglobin: 12.9 g/dL (ref 12.0–15.0)
LYMPHS ABS: 1.5 10*3/uL (ref 0.7–4.0)
Lymphocytes Relative: 25 % (ref 12–46)
MCH: 27.9 pg (ref 26.0–34.0)
MCHC: 32.7 g/dL (ref 30.0–36.0)
MCV: 85.3 fL (ref 78.0–100.0)
Monocytes Absolute: 0.6 10*3/uL (ref 0.1–1.0)
Monocytes Relative: 10 % (ref 3–12)
NEUTROS PCT: 63 % (ref 43–77)
Neutro Abs: 3.9 10*3/uL (ref 1.7–7.7)
Platelets: 237 10*3/uL (ref 150–400)
RBC: 4.62 MIL/uL (ref 3.87–5.11)
RDW: 13.9 % (ref 11.5–15.5)
WBC: 6.1 10*3/uL (ref 4.0–10.5)

## 2014-05-23 LAB — GLUCOSE, CAPILLARY
GLUCOSE-CAPILLARY: 64 mg/dL — AB (ref 70–99)
Glucose-Capillary: 161 mg/dL — ABNORMAL HIGH (ref 70–99)
Glucose-Capillary: 61 mg/dL — ABNORMAL LOW (ref 70–99)
Glucose-Capillary: 204 mg/dL — ABNORMAL HIGH (ref 70–99)
Glucose-Capillary: 229 mg/dL — ABNORMAL HIGH (ref 70–99)
Glucose-Capillary: 99 mg/dL (ref 70–99)

## 2014-05-23 LAB — PREGNANCY, URINE: PREG TEST UR: NEGATIVE

## 2014-05-23 LAB — COMPREHENSIVE METABOLIC PANEL
ALBUMIN: 4.3 g/dL (ref 3.5–5.2)
ALT: 24 U/L (ref 0–35)
ALT: 26 U/L (ref 0–35)
ANION GAP: 12 (ref 5–15)
AST: 32 U/L (ref 0–37)
AST: 31 U/L (ref 0–37)
Albumin: 4.1 g/dL (ref 3.5–5.2)
Alkaline Phosphatase: 60 U/L (ref 39–117)
Alkaline Phosphatase: 65 U/L (ref 39–117)
Anion gap: 11 (ref 5–15)
BILIRUBIN TOTAL: 0.8 mg/dL (ref 0.3–1.2)
BUN: 15 mg/dL (ref 6–23)
BUN: 12 mg/dL (ref 6–23)
CHLORIDE: 103 meq/L (ref 96–112)
CO2: 24 mmol/L (ref 19–32)
CO2: 26 mmol/L (ref 19–32)
CREATININE: 0.73 mg/dL (ref 0.50–1.10)
Calcium: 9 mg/dL (ref 8.4–10.5)
Calcium: 9 mg/dL (ref 8.4–10.5)
Chloride: 100 mEq/L (ref 96–112)
Creatinine, Ser: 0.65 mg/dL (ref 0.50–1.10)
GFR calc non Af Amer: >90 mL/min (ref >90)
GLUCOSE: 95 mg/dL (ref 70–99)
Glucose, Bld: 205 mg/dL — ABNORMAL HIGH (ref 70–99)
Potassium: 3.8 mmol/L (ref 3.5–5.1)
Potassium: 3.4 mmol/L — ABNORMAL LOW (ref 3.5–5.1)
SODIUM: 135 mmol/L (ref 135–145)
Sodium: 141 mmol/L (ref 135–145)
TOTAL PROTEIN: 7.2 g/dL (ref 6.0–8.3)
TOTAL PROTEIN: 7.6 g/dL (ref 6.0–8.3)
Total Bilirubin: 1 mg/dL (ref 0.3–1.2)

## 2014-05-23 LAB — ACETAMINOPHEN LEVEL: Acetaminophen (Tylenol), Serum: <10 ug/mL — ABNORMAL LOW (ref 10–30)

## 2014-05-23 LAB — HCG, QUANTITATIVE, PREGNANCY: hCG, Beta Chain, Quant, S: <1 m[IU]/mL (ref <5)

## 2014-05-23 LAB — MRSA PCR SCREENING: MRSA BY PCR: NEGATIVE

## 2014-05-23 LAB — AMMONIA: Ammonia: <9 umol/L — ABNORMAL LOW (ref 11–32)

## 2014-05-23 LAB — ETHANOL: Alcohol, Ethyl (B): <5 mg/dL (ref 0–9)

## 2014-05-23 LAB — LACTIC ACID, PLASMA: LACTIC ACID, VENOUS: 2.1 mmol/L (ref 0.5–2.2)

## 2014-05-23 LAB — CK: CK TOTAL: 66 U/L (ref 7–177)

## 2014-05-23 MED ORDER — MIDAZOLAM HCL 2 MG/2ML IJ SOLN
INTRAMUSCULAR | Status: AC
  Filled 2014-05-23: qty 2

## 2014-05-23 MED ORDER — FOLIC ACID 5 MG/ML IJ SOLN
1.0000 mg | Freq: Every day | INTRAMUSCULAR | Status: DC
  Administered 2014-05-23 – 2014-05-25 (×3): 1 mg via INTRAVENOUS
  Filled 2014-05-23 (×4): qty 0.2

## 2014-05-23 MED ORDER — FENTANYL CITRATE 0.05 MG/ML IJ SOLN
100.0000 ug | Freq: Once | INTRAMUSCULAR | Status: AC
  Administered 2014-05-23: 100 ug via INTRAVENOUS

## 2014-05-23 MED ORDER — DEXTROSE 50 % IV SOLN
INTRAVENOUS | Status: AC
  Administered 2014-05-23: 50 mL
  Filled 2014-05-23: qty 50

## 2014-05-23 MED ORDER — ONDANSETRON HCL 4 MG/2ML IJ SOLN
4.0000 mg | Freq: Four times a day (QID) | INTRAMUSCULAR | Status: DC | PRN
Start: 2014-05-23 — End: 2014-05-26

## 2014-05-23 MED ORDER — MIDAZOLAM HCL 2 MG/2ML IJ SOLN: 4.0000 mg | Freq: Once | INTRAMUSCULAR | Status: DC

## 2014-05-23 MED ORDER — DEXTROSE 50 % IV SOLN
INTRAVENOUS | Status: AC
Start: 2014-05-23 — End: 2014-05-23
  Administered 2014-05-23: 50 mL
  Filled 2014-05-23: qty 50

## 2014-05-23 MED ORDER — SODIUM CHLORIDE 0.9 % IV SOLN
25.0000 ug/h | INTRAVENOUS | Status: DC
  Administered 2014-05-23: 100 ug/h via INTRAVENOUS
  Administered 2014-05-23: 200 ug/h via INTRAVENOUS
  Filled 2014-05-23 (×4): qty 50

## 2014-05-23 MED ORDER — INFLUENZA VAC SPLIT QUAD 0.5 ML IM SUSY
0.5000 mL | PREFILLED_SYRINGE | INTRAMUSCULAR | Status: DC
  Filled 2014-05-23 (×2): qty 0.5

## 2014-05-23 MED ORDER — VITAL AF 1.2 CAL PO LIQD
1000.0000 mL | ORAL | Status: DC
  Administered 2014-05-23: 1000 mL
  Filled 2014-05-23 (×3): qty 1000

## 2014-05-23 MED ORDER — SODIUM CHLORIDE 0.9 % IV BOLUS (SEPSIS)
500.0000 mL | Freq: Once | INTRAVENOUS | Status: AC
  Administered 2014-05-23: 500 mL via INTRAVENOUS

## 2014-05-23 MED ORDER — ACETAMINOPHEN 325 MG PO TABS
650.0000 mg | ORAL_TABLET | Freq: Four times a day (QID) | ORAL | Status: DC | PRN
  Administered 2014-05-24: 650 mg via ORAL
  Filled 2014-05-23: qty 2

## 2014-05-23 MED ORDER — SODIUM CHLORIDE 0.9 % IV SOLN
INTRAVENOUS | Status: DC
  Administered 2014-05-23: 125 mL/h via INTRAVENOUS

## 2014-05-23 MED ORDER — ALBUTEROL SULFATE (2.5 MG/3ML) 0.083% IN NEBU: 2.5000 mg | INHALATION_SOLUTION | RESPIRATORY_TRACT | Status: DC | PRN

## 2014-05-23 MED ORDER — DIPHENHYDRAMINE HCL 50 MG/ML IJ SOLN
25.0000 mg | Freq: Four times a day (QID) | INTRAMUSCULAR | Status: AC
  Administered 2014-05-23: 25 mg via INTRAVENOUS

## 2014-05-23 MED ORDER — DIPHENHYDRAMINE HCL 50 MG/ML IJ SOLN
INTRAMUSCULAR | Status: AC
  Filled 2014-05-23: qty 1

## 2014-05-23 MED ORDER — PANTOPRAZOLE SODIUM 40 MG IV SOLR
40.0000 mg | Freq: Every day | INTRAVENOUS | Status: DC
  Administered 2014-05-23: 40 mg via INTRAVENOUS
  Filled 2014-05-23 (×2): qty 40

## 2014-05-23 MED ORDER — ACETAMINOPHEN 650 MG RE SUPP
650.0000 mg | Freq: Four times a day (QID) | RECTAL | Status: DC | PRN
  Administered 2014-05-23 – 2014-05-24 (×2): 650 mg via RECTAL
  Filled 2014-05-23 (×2): qty 1

## 2014-05-23 MED ORDER — CHLORHEXIDINE GLUCONATE 0.12 % MT SOLN
15.0000 mL | Freq: Two times a day (BID) | OROMUCOSAL | Status: DC
  Administered 2014-05-23 – 2014-05-24 (×3): 15 mL via OROMUCOSAL
  Filled 2014-05-23 (×3): qty 15

## 2014-05-23 MED ORDER — VITAL HIGH PROTEIN PO LIQD
1000.0000 mL | ORAL | Status: DC
  Filled 2014-05-23: qty 1000

## 2014-05-23 MED ORDER — MIDAZOLAM HCL 2 MG/2ML IJ SOLN
1.0000 mg | INTRAMUSCULAR | Status: DC | PRN
  Administered 2014-05-23: 2 mg via INTRAVENOUS
  Filled 2014-05-23: qty 2

## 2014-05-23 MED ORDER — ROCURONIUM BROMIDE 50 MG/5ML IV SOLN
50.0000 mg | Freq: Once | INTRAVENOUS | Status: AC
  Administered 2014-05-23: 50 mg via INTRAVENOUS
  Filled 2014-05-23: qty 5

## 2014-05-23 MED ORDER — FENTANYL CITRATE 0.05 MG/ML IJ SOLN: 50.0000 ug | Freq: Once | INTRAMUSCULAR | Status: DC

## 2014-05-23 MED ORDER — FENTANYL CITRATE 0.05 MG/ML IJ SOLN
INTRAMUSCULAR | Status: AC
Start: 2014-05-23 — End: 2014-05-23
  Administered 2014-05-23: 100 ug
  Filled 2014-05-23: qty 4

## 2014-05-23 MED ORDER — FENTANYL BOLUS VIA INFUSION
50.0000 ug | INTRAVENOUS | Status: DC | PRN
  Filled 2014-05-23: qty 50

## 2014-05-23 MED ORDER — THIAMINE HCL 100 MG/ML IJ SOLN
100.0000 mg | Freq: Every day | INTRAMUSCULAR | Status: DC
  Administered 2014-05-23 – 2014-05-24 (×2): 100 mg via INTRAVENOUS
  Administered 2014-05-25: 50 mg via INTRAVENOUS
  Filled 2014-05-23 (×3): qty 2

## 2014-05-23 MED ORDER — ETOMIDATE 2 MG/ML IV SOLN
10.0000 mg | Freq: Once | INTRAVENOUS | Status: AC
  Administered 2014-05-23: 10 mg via INTRAVENOUS
  Filled 2014-05-23: qty 5

## 2014-05-23 MED ORDER — CETYLPYRIDINIUM CHLORIDE 0.05 % MT LIQD
7.0000 mL | Freq: Two times a day (BID) | OROMUCOSAL | Status: DC
  Administered 2014-05-23 (×2): 7 mL via OROMUCOSAL

## 2014-05-23 MED ORDER — ENOXAPARIN SODIUM 40 MG/0.4ML ~~LOC~~ SOLN
40.0000 mg | SUBCUTANEOUS | Status: DC
  Administered 2014-05-23 – 2014-05-25 (×3): 40 mg via SUBCUTANEOUS
  Filled 2014-05-23 (×3): qty 0.4

## 2014-05-23 MED ORDER — MIDAZOLAM HCL 2 MG/2ML IJ SOLN
INTRAMUSCULAR | Status: AC
  Filled 2014-05-23: qty 4

## 2014-05-23 MED ORDER — LORAZEPAM 2 MG/ML IJ SOLN
INTRAMUSCULAR | Status: AC
  Administered 2014-05-23: 1 mg
  Filled 2014-05-23: qty 1

## 2014-05-23 MED ORDER — PROPOFOL 10 MG/ML IV EMUL
5.0000 ug/kg/min | INTRAVENOUS | Status: DC
  Filled 2014-05-23: qty 100

## 2014-05-23 MED ORDER — MIDAZOLAM HCL 2 MG/2ML IJ SOLN
1.0000 mg | INTRAMUSCULAR | Status: DC | PRN
  Administered 2014-05-23: 2 mg via INTRAVENOUS
  Administered 2014-05-23: 1 mg via INTRAVENOUS
  Administered 2014-05-23 (×3): 2 mg via INTRAVENOUS
  Filled 2014-05-23 (×4): qty 2

## 2014-05-23 MED ORDER — DEXMEDETOMIDINE HCL IN NACL 200 MCG/50ML IV SOLN
0.2000 ug/kg/h | INTRAVENOUS | Status: AC
  Administered 2014-05-23: 0.7 ug/kg/h via INTRAVENOUS
  Filled 2014-05-23: qty 50

## 2014-05-23 MED ORDER — SODIUM CHLORIDE 0.9 % IV SOLN
INTRAVENOUS | Status: DC
  Administered 2014-05-23 – 2014-05-24 (×3): via INTRAVENOUS

## 2014-05-23 MED ORDER — ONDANSETRON HCL 4 MG PO TABS: 4.0000 mg | ORAL_TABLET | Freq: Four times a day (QID) | ORAL | Status: DC | PRN

## 2014-05-23 NOTE — ED Notes (Signed)
Restless in bed/tossing from side to side-mumbling speech with eyes closed/Father remains at bedside-VSS/MP ST with no ectopy noted

## 2014-05-23 NOTE — Procedures (Signed)
Intubation Procedure Note [place] Truett [id number] 1990/09/04  Procedure: Intubation Indications: Airway protection and maintenance  Procedure Details Consent: Risks of procedure as well as the alternatives and risks of each were explained to the (patient/caregiver).  Consent for procedure obtained. Time Out: Verified patient identification, verified procedure, site/side was marked, verified correct patient position, special equipment/implants available, medications/allergies/relevent history reviewed, required imaging and test results available.  Performed  Drugs Etomidate , Rocuronium , Versed , Fentanyl DL x 1 with GS 3 blade Grade 1 view 7.5 tube passed through cords under direct visualization Placement confirmed with bilateral breath sounds, positive EtCO2 change and smoke in tube   Evaluation Hemodynamic Status: BP stable throughout; O2 sats: stable throughout Patient's Current Condition: stable Complications: No apparent complications Patient did tolerate procedure well. Chest X-ray ordered to verify placement.  CXR: pending.   Jaquasia Doscher 05/23/2014

## 2014-05-23 NOTE — Progress Notes (Signed)
eLink Physician-Brief Progress Note Patient Name: Misty Ross DOB: Oct 22, 1990 MRN: [id number]   Date of Service  05/23/2014  HPI/Events of Note  Pt febrile  eICU Interventions  Pan culture, ua, cxr. Hold on abx pending culture results.     Intervention Category Major Interventions: Infection - evaluation and management  Argel Pablo K. 05/23/2014, 10:26 PM

## 2014-05-23 NOTE — ED Notes (Signed)
Poison Control updated of pt.'s condition, spoke to [place]. To continue to monitor pt.

## 2014-05-23 NOTE — Plan of Care (Addendum)
Problem: ICU Phase Progression Outcomes Goal: Dyspnea controlled at rest Outcome: Not Met (add Reason) Pt intubated at beginning of shift for airway management.  Pt kept comfortable and sedated for a goal of RASS -1 goal.

## 2014-05-23 NOTE — Procedures (Signed)
Intubation Procedure Note Misty Ross [id number] 06/30/1990  Procedure: Intubation Indications: Respiratory insufficiency  Procedure Details Consent: Unable to obtain consent because of altered level of consciousness. Time Out: Verified patient identification, verified procedure, site/side was marked, verified correct patient position, special equipment/implants available, medications/allergies/relevent history reviewed, required imaging and test results available.  Performed  Maximum sterile technique was used including gloves, gown, hand hygiene and mask.  MAC and 3    Evaluation Hemodynamic Status: BP stable throughout; O2 sats: transiently fell during during procedure Patient's Current Condition: stable Complications: No apparent complications Patient did tolerate procedure well. Chest X-ray ordered to verify placement.  CXR: pending.   Misty Ross 05/23/2014  Original intubation resulted in ETT at 23cm lip- per McQuaid had RT pull back ETT 2 cm- now resides at 21 cm lip- RN aware.

## 2014-05-23 NOTE — Progress Notes (Signed)
PT transported on vent to [facility] CT (100% Fi02).

## 2014-05-23 NOTE — ED Provider Notes (Signed)
CSN: [id number]     Arrival date & time 05/22/14  2354 History   First MD Initiated Contact with Patient 05/23/14 0007     Chief Complaint  Patient presents with  . Drug Overdose     (Consider location/radiation/quality/duration/timing/severity/associated sxs/prior Treatment) HPI  Level 5 Caveat: altered LOC. This is a 24 year old female with a history of psychiatric illness and heroin abuse. She was observed by her brother taking an unknown number of an unknown mixture of pills. The pills were in a Seroquel ER sample bottle which was labelled as containing four tablets. She said she took the pills to "get f---ed up". EMS reports the pill bottle was not hers and was empty; she may take in as much is 6000 milligrams. She was awake and able to ambulate at home. On arrival she was noted to have slurred speech and her level of consciousness has declined since arrival.  Past Medical History  Diagnosis Date  . Allergy   . Anxiety   . Depression   . Seizures   . ADHD (attention deficit hyperactivity disorder)    Past Surgical History  Procedure Laterality Date  . Tonsillectomy and adenoidectomy     Family History  Problem Relation Age of Onset  . Hyperlipidemia Father   . Cancer Maternal Grandmother   . Diabetes Maternal Grandmother   . Cancer Maternal Grandfather   . Heart disease Paternal Grandmother   . Cancer Paternal Grandfather    History  Substance Use Topics  . Smoking status: Current Every Day Smoker -- 0.50 packs/day    Types: Cigarettes  . Smokeless tobacco: Not on file  . Alcohol Use: Yes     Comment: one beer per day   OB History    No data available     Review of Systems  All other systems reviewed and are negative.   Allergies  Review of patient's allergies indicates no known allergies.  Home Medications   Prior to Admission medications   Medication Sig Start Date End Date Taking? Authorizing Provider  amphetamine-dextroamphetamine (ADDERALL) 10 MG  tablet Take 1 tablet (10 mg total) by mouth daily with breakfast. 07/15/13   Ethelda Chick, MD  CLONIDINE HCL PO Take 0.4 mg by mouth daily.     Historical Provider, MD  HYDROcodone-homatropine (HYCODAN) 5-1.5 MG/5ML syrup Take 5 mLs by mouth every 6 (six) hours as needed for cough. 07/19/13   Ethelda Chick, MD  lisdexamfetamine (VYVANSE) 30 MG capsule Take 1 capsule (30 mg total) by mouth daily. 07/15/13   Ethelda Chick, MD  oseltamivir (TAMIFLU) 75 MG capsule Take 1 capsule (75 mg total) by mouth 2 (two) times daily. 07/12/13   Peyton Najjar, MD   BP 109/57 mmHg  Pulse 110  Temp(Src) 97.8 F (36.6 C) (Oral)  Resp 12  SpO2 94%   Physical Exam  General: Well-developed, well-nourished female in no acute distress; appearance consistent with age of record HENT: normocephalic; atraumatic Eyes: pupils equal, round and reactive to light; extraocular muscle function could not be formally assessed but appears intact Neck: supple Heart: regular rate and rhythm; tachycardia Lungs: clear to auscultation bilaterally Abdomen: soft; nondistended; no masses or hepatosplenomegaly; bowel sounds present Extremities: No deformity; full range of motion; pulses normal; multiple track marks of forearms and hands Neurologic: Somnolent, transiently arousable to voice and noxious stimuli; oriented to person and place but falls asleep while answering questions; noted to move all extremities Skin: Warm and dry   ED Course  Procedures (including critical care time)  CRITICAL CARE Performed by: Yenifer Saccente L Total critical care time: 45 minutes Critical care time was exclusive of separately billable procedures and treating other patients. Critical care was necessary to treat or prevent imminent or life-threatening deterioration. Critical care was time spent personally by me on the following activities: development of treatment plan with patient and/or surrogate as well as nursing, discussions with consultants,  evaluation of patient's response to treatment, examination of patient, obtaining history from patient or surrogate, ordering and performing treatments and interventions, ordering and review of laboratory studies, ordering and review of radiographic studies, pulse oximetry and re-evaluation of patient's condition.   MDM  Nursing notes and vitals signs, including pulse oximetry, reviewed.  Summary of this visit's results, reviewed by myself:   EKG Interpretation  Date/Time:  Saturday May 23 2014 00:00:31 EST Ventricular Rate:  96 PR Interval:  135 QRS Duration: 92 QT Interval:  346 QTC Calculation: 437 R Axis:   63 Text Interpretation:  Sinus rhythm Rate is slower Confirmed by Dmari Schubring  MD, Jonny Ruiz ([id number]) on 05/23/2014 12:21:07 AM      Labs:  Results for orders placed or performed during the hospital encounter of 05/22/14 (from the past 24 hour(s))  CBC with Differential     Status: None   Collection Time: 05/23/14 12:28 AM  Result Value Ref Range   WBC 6.1 4.0 - 10.5 K/uL   RBC 4.62 3.87 - 5.11 MIL/uL   Hemoglobin 12.9 12.0 - 15.0 g/dL   HCT 60.4 54.0 - 98.1 %   MCV 85.3 78.0 - 100.0 fL   MCH 27.9 26.0 - 34.0 pg   MCHC 32.7 30.0 - 36.0 g/dL   RDW 19.1 47.8 - 29.5 %   Platelets 237 150 - 400 K/uL   Neutrophils Relative % 63 43 - 77 %   Neutro Abs 3.9 1.7 - 7.7 K/uL   Lymphocytes Relative 25 12 - 46 %   Lymphs Abs 1.5 0.7 - 4.0 K/uL   Monocytes Relative 10 3 - 12 %   Monocytes Absolute 0.6 0.1 - 1.0 K/uL   Eosinophils Relative 2 0 - 5 %   Eosinophils Absolute 0.1 0.0 - 0.7 K/uL   Basophils Relative 0 0 - 1 %   Basophils Absolute 0.0 0.0 - 0.1 K/uL  Comprehensive metabolic panel     Status: None   Collection Time: 05/23/14 12:28 AM  Result Value Ref Range   Sodium 135 135 - 145 mmol/L   Potassium 3.8 3.5 - 5.1 mmol/L   Chloride 100 96 - 112 mEq/L   CO2 24 19 - 32 mmol/L   Glucose, Bld 95 70 - 99 mg/dL   BUN 15 6 - 23 mg/dL   Creatinine, Ser 6.21 0.50 - 1.10 mg/dL    Calcium 9.0 8.4 - 30.8 mg/dL   Total Protein 7.2 6.0 - 8.3 g/dL   Albumin 4.1 3.5 - 5.2 g/dL   AST 32 0 - 37 U/L   ALT 24 0 - 35 U/L   Alkaline Phosphatase 60 39 - 117 U/L   Total Bilirubin 1.0 0.3 - 1.2 mg/dL   GFR calc non Af Amer >90 >90 mL/min   GFR calc Af Amer >90 >90 mL/min   Anion gap 11 5 - 15  Ethanol     Status: None   Collection Time: 05/23/14 12:28 AM  Result Value Ref Range   Alcohol, Ethyl (B) <5 0 - 9 mg/dL  Acetaminophen level  Status: Abnormal   Collection Time: 05/23/14 12:28 AM  Result Value Ref Range   Acetaminophen (Tylenol), Serum <10.0 (L) 10 - 30 ug/mL  Salicylate level     Status: None   Collection Time: 05/23/14 12:28 AM  Result Value Ref Range   Salicylate Lvl <4.0 2.8 - 20.0 mg/dL  Drug screen panel, emergency     Status: Abnormal   Collection Time: 05/23/14  1:17 AM  Result Value Ref Range   Opiates POSITIVE (A) NONE DETECTED   Cocaine POSITIVE (A) NONE DETECTED   Benzodiazepines POSITIVE (A) NONE DETECTED   Amphetamines NONE DETECTED NONE DETECTED   Tetrahydrocannabinol POSITIVE (A) NONE DETECTED   Barbiturates NONE DETECTED NONE DETECTED    1:37 AM Patient still arouses transiently to voice. Vital signs are stable.  4:01 AM Patient checked periodically, continues to have stable vital signs and arouses to voice.    Hanley Seamen, MD 05/23/14 [phone]

## 2014-05-23 NOTE — ED Notes (Signed)
Patient with very poor peripheral veins due to patient's scar tissue from shooting heroin

## 2014-05-23 NOTE — Consult Note (Signed)
PULMONARY / CRITICAL CARE MEDICINE   Name: Misty Ross MRN: [id number] DOB: [birth year]    ADMISSION DATE:  05/22/2014 CONSULTATION DATE:  05/23/2014  REFERRING MD :  Toniann Fail TRH  CHIEF COMPLAINT:  Agitation, suicide attempt  INITIAL PRESENTATION: 24 y/o female with a history of polysubstance abuse was admitted on 1/2 after taking presumably 20 tablets of seroquel.  UDS found to be positive for multiple illicit drugs.  She was admitted to the ICU and PCCM was consulted for worsening encephalopathy and agitation.  STUDIES:    SIGNIFICANT EVENTS: 1/1 admission 1/2 intubated  HISTORY OF PRESENT ILLNESS:  Misty Ross is a 24 y/o female with a history of heroin and cocaine abuse who was admitted on 1/1 to [facility] from the ED after taking 20 tablets of seroquel.  Her father provides the history because the patient was encephalopathic.  He notes a history of polysubstance abuse, but to his knowledge she has not been prescribed seroquel.  She was admitted by [facility] but required precedex for marked agitation.  PCCM was consulted.  PAST MEDICAL HISTORY :   has a past medical history of Allergy; Anxiety; Depression; Seizures; and ADHD (attention deficit hyperactivity disorder).  has past surgical history that includes Tonsillectomy and adenoidectomy. Prior to Admission medications   Medication Sig Start Date End Date Taking? Authorizing Provider  amphetamine-dextroamphetamine (ADDERALL) 10 MG tablet Take 1 tablet (10 mg total) by mouth daily with breakfast. 07/15/13   Ethelda Chick, MD  CLONIDINE HCL PO Take 0.4 mg by mouth daily.     Historical Provider, MD  HYDROcodone-homatropine (HYCODAN) 5-1.5 MG/5ML syrup Take 5 mLs by mouth every 6 (six) hours as needed for cough. 07/19/13   Ethelda Chick, MD  lisdexamfetamine (VYVANSE) 30 MG capsule Take 1 capsule (30 mg total) by mouth daily. 07/15/13   Ethelda Chick, MD  oseltamivir (TAMIFLU) 75 MG capsule Take 1 capsule (75 mg total) by mouth 2 (two) times  daily. 07/12/13   Peyton Najjar, MD   No Known Allergies  FAMILY HISTORY:  indicated that her mother is alive. She indicated that her father is alive. She indicated that her maternal grandmother is deceased. She indicated that her maternal grandfather is deceased. She indicated that her paternal grandmother is alive. She indicated that her paternal grandfather is deceased.  SOCIAL HISTORY:  reports that she has been smoking Cigarettes.  She has been smoking about 0.50 packs per day. She does not have any smokeless tobacco history on file. She reports that she drinks alcohol. She reports that she uses illicit drugs.  REVIEW OF SYSTEMS:  Cannot obtain due to encephalopathy  SUBJECTIVE:   VITAL SIGNS: Temp:  [97 F (36.1 C)-98.1 F (36.7 C)] 97 F (36.1 C) (01/02 0529) Pulse Rate:  [96-157] 157 (01/02 0529) Resp:  [11-25] 25 (01/02 0529) BP: (109-177)/(57-113) 177/83 mmHg (01/02 0529) SpO2:  [94 %-100 %] 98 % (01/02 0529) Weight:  [48.9 kg (107 lb 12.9 oz)] 48.9 kg (107 lb 12.9 oz) (01/02 0529) HEMODYNAMICS:   VENTILATOR SETTINGS:   INTAKE / OUTPUT:  Intake/Output Summary (Last 24 hours) at 05/23/14 0739 Last data filed at 05/23/14 0700  Gross per 24 hour  Intake 755.73 ml  Output   1300 ml  Net -544.27 ml    PHYSICAL EXAMINATION: General:  Intermittently combative Neuro:  Non-verbal, somnolent on my initial exam but intermittently combative, moving all four ext HEENT:  NCAT, MM dry, OP clear Cardiovascular:  Tachy, regular Lungs:  CTA B  Abdomen:  BS+, soft, nontender Musculoskeletal:  Normal bulk and tone Skin:  Dry, warm; track marks arms bilaterally  LABS:  CBC  Recent Labs Lab 05/23/14 0028  WBC 6.1  HGB 12.9  HCT 39.4  PLT 237   Coag's No results for input(s): APTT, INR in the last 168 hours. BMET  Recent Labs Lab 05/23/14 0028  NA 135  K 3.8  CL 100  CO2 24  BUN 15  CREATININE 0.65  GLUCOSE 95   Electrolytes  Recent Labs Lab  05/23/14 0028  CALCIUM 9.0   Sepsis Markers No results for input(s): LATICACIDVEN, PROCALCITON, O2SATVEN in the last 168 hours. ABG  Recent Labs Lab 05/23/14 0440  PHART 7.471*  PCO2ART 34.0*  PO2ART 105.0*   Liver Enzymes  Recent Labs Lab 05/23/14 0028  AST 32  ALT 24  ALKPHOS 60  BILITOT 1.0  ALBUMIN 4.1   Cardiac Enzymes No results for input(s): TROPONINI, PROBNP in the last 168 hours. Glucose No results for input(s): GLUCAP in the last 168 hours.  Imaging No results found.  EKG: Sinus tach, QTc 437, no ST wave change  ASSESSMENT / PLAN:  Toxicology: A: At this point it appears she took cocaine, an opiate (heroine?) and seroquel; has no evidence of end organ damage aside from encephalopathy which will hopefully be transient P: Supportive care as detailed below  PULMONARY OETT 1/2 >> A: Acute respiratory failure due to drug overdose Not protecting airway in setting of encephalopathy P:   Intubate now and provide full vent support ABG and CXR post intubation Daily WUA and SBT  CARDIOVASCULAR CVL n/a A: Sinus tachycardia> presumably related to cocaine abuse and volume depletion P:  Volume resuscitation Tele Avoid selective b-blockade Monitor QTc in setting of seroquel OD  RENAL A:  No acute issues P:   Monitor BMET and UOP Replace electrolytes as needed  GASTROINTESTINAL A:  No acute issues P:   OG tube Tube feedings Pantoprazole for stress ulcer prophylaxis  HEMATOLOGIC A:  No acute issues P:  Monitor for bleeding  INFECTIOUS A:  No acute issues P:   Monitor for fever  ENDOCRINE A:  No acute issues   P:   Monitor blood glucose  NEUROLOGIC A:  Acute encephalopathy due to drug overdose (agitated delirium requiring sedation) Opiate dependence Cocaine use History of EtOH use P:   RASS goal: -1 Fentanyl gtt, Propofol gtt (if needed) Prn versed Thiamine/folate  Psyche: A: Suicide attempt P: Will need suicide precautions  and psyche eval after extubation   FAMILY  - Updates: Father updated at bedside by me 1/2 AM   TODAY'S SUMMARY: 24 y/o female with what appears to be an intentional overdose of seroquel in the setting of polysubstance abuse with heroin and cocaine and alcohol.    CC time by me 40 minutes   Heber [place], MD [facility] Pager: [phone] Cell: [phone] If no response, call [phone]   05/23/2014, 7:39 AM

## 2014-05-23 NOTE — Progress Notes (Signed)
PT has been transported back to [facility] 1237 (was on 100% Fi02)- uneventful. PT vent settings are now PRVC 420, rr14, fi02 90%, peep 5. RN at bedside.

## 2014-05-23 NOTE — ED Notes (Signed)
MD at bedside. 

## 2014-05-23 NOTE — Progress Notes (Signed)
Per McQuaid- pull back ETT another 2 cm- now resides at 19cm Lip.

## 2014-05-23 NOTE — Progress Notes (Signed)
E-link informed of pt elevated temperature.

## 2014-05-23 NOTE — Progress Notes (Signed)
Order noted for arterial blood gas. Present safety risk for both patient and staff at this time. Order time changed. RT to follow up and attempt arterial puncture once patient is more calmly sedated and stops fighting. 4 Rn's at bedside at this time due to patient condition with attempts to prevent harm.

## 2014-05-23 NOTE — H&P (Signed)
[facility] History and Physical  Misty Ross QIH:[id number] DOB: [birth year] DOA: 05/22/2014  Referring physician: ER physician. PCP: No PCP Per Patient  Chief Complaint: Drug overdose.  History obtained from patient's statin. Patient is confused and agitated at this time.  HPI: Misty Ross is a 24 y.o. female with a history of polysubstance abuse and ADHD was brought into the ER the patient stated to her that that she had consumed 20 pills of Seroquel dose of which was not clear. As per patient's dad patient has been abusing street drugs including IV drugs. Last night patient was very agitated and was stating that she was not getting high and later took 20 pills of Seroquel. Patient's dad is not sure from where the patient got the Seroquel as it was not prescribed for her. In addition a urine drug screen shows positive for cocaine opiates benzos and marijuana. Patient after coming to the ER initially was elevated but later on at my exam was very agitated tachycardic and hypertensive but not febrile. I have ordered 1 mg of IV Ativan and have consulted pulmonary critical care Dr. Tyson Alias who at this time is ordering Precedex and Midozolam. Patient will be admitted for further management of her acute agitational status from drug overdose. Salicylates and acetaminophen levels are negative. CT head is pending.  Review of Systems: As presented in the history of presenting illness, rest negative.  Past Medical History  Diagnosis Date  . Allergy   . Anxiety   . Depression   . Seizures   . ADHD (attention deficit hyperactivity disorder)    Past Surgical History  Procedure Laterality Date  . Tonsillectomy and adenoidectomy     Social History:  reports that she has been smoking Cigarettes.  She has been smoking about 0.50 packs per day. She does not have any smokeless tobacco history on file. She reports that she drinks alcohol. She reports that she uses illicit drugs. Where does patient  live home. Can patient participate in ADLs? Yes.  No Known Allergies  Family History:  Family History  Problem Relation Age of Onset  . Hyperlipidemia Father   . Cancer Maternal Grandmother   . Diabetes Maternal Grandmother   . Cancer Maternal Grandfather   . Heart disease Paternal Grandmother   . Cancer Paternal Grandfather   . Multiple sclerosis Brother       Prior to Admission medications   Medication Sig Start Date End Date Taking? Authorizing Provider  amphetamine-dextroamphetamine (ADDERALL) 10 MG tablet Take 1 tablet (10 mg total) by mouth daily with breakfast. 07/15/13   Ethelda Chick, MD  CLONIDINE HCL PO Take 0.4 mg by mouth daily.     Historical Provider, MD  HYDROcodone-homatropine (HYCODAN) 5-1.5 MG/5ML syrup Take 5 mLs by mouth every 6 (six) hours as needed for cough. 07/19/13   Ethelda Chick, MD  lisdexamfetamine (VYVANSE) 30 MG capsule Take 1 capsule (30 mg total) by mouth daily. 07/15/13   Ethelda Chick, MD  oseltamivir (TAMIFLU) 75 MG capsule Take 1 capsule (75 mg total) by mouth 2 (two) times daily. 07/12/13   Peyton Najjar, MD    Physical Exam: Filed Vitals:   05/23/14 0430 05/23/14 0435 05/23/14 0500 05/23/14 0529  BP: 130/75  125/97 177/83  Pulse: 134  155 157  Temp:  98.1 F (36.7 C)  97 F (36.1 C)  TempSrc:  Core (Comment)    Resp: Height:     (1.6 m)  Weight:    48.9 kg (107 lb 12.9 oz)  SpO2: 100%  99% 98%     General:  Moderately built and nourished.  Eyes: Pupils are equal and reacting. Anicteric no pallor.  ENT: No discharge from ears eyes nose or mouth.  Neck: No neck rigidity or mass felt.  Cardiovascular: S1 and S2 heard tachycardic.  Respiratory: No rhonchi or crepitations.  Abdomen: Soft nontender bowel sounds present.  Skin: No rash.  Musculoskeletal: No edema.  Psychiatric: Patient is confused and agitated.  Neurologic: Patient is confused and agitated.  Labs on Admission:  Basic Metabolic  Panel:  Recent Labs Lab 05/23/14 0028  NA 135  K 3.8  CL 100  CO2 24  GLUCOSE 95  BUN 15  CREATININE 0.65  CALCIUM 9.0   Liver Function Tests:  Recent Labs Lab 05/23/14 0028  AST 32  ALT 24  ALKPHOS 60  BILITOT 1.0  PROT 7.2  ALBUMIN 4.1   No results for input(s): LIPASE, AMYLASE in the last 168 hours. No results for input(s): AMMONIA in the last 168 hours. CBC:  Recent Labs Lab 05/23/14 0028  WBC 6.1  NEUTROABS 3.9  HGB 12.9  HCT 39.4  MCV 85.3  PLT 237   Cardiac Enzymes: No results for input(s): CKTOTAL, CKMB, CKMBINDEX, TROPONINI in the last 168 hours.  BNP (last 3 results) No results for input(s): PROBNP in the last 8760 hours. CBG: No results for input(s): GLUCAP in the last 168 hours.  Radiological Exams on Admission: No results found.  EKG: Independently reviewed. Normal sinus rhythm with QTC of 437 ms with normal QRS.  Assessment/Plan Principal Problem:   Acute encephalopathy Active Problems:   Attention deficit hyperactivity disorder (ADHD)   Suicidal ideation   Polysubstance abuse   Drug overdose, intentional   1. Acute encephalopathy - probably secondary to drug overdose. Patient has been stated to have taken Seroquel as per the family. Patient also is positive for cocaine opiates and benzo and marijuana. At this time patient was given Ativan 1 mg IV and I have discussed with Dr. Tyson Alias on call pulmonary critical care doctor was ordered [address] and Precedex and they will be seeing patient in consult. Patient will be kept nothing by mouth. CT head has been ordered and repeat ABG lactate levels and CK levels have been ordered. Patient is afebrile at this time. Closely monitor and stepdown/ICU setup. 2. Intentional drug overdose with suicidal ideation - suicide precautions with sitter. Will need psychiatric consult once patient is more alert and awake. 3. History of IV drug abuse - check HIV status and will need counseling once patient is  more alert and awake. 4. History of ADHD.   DVT Prophylaxis SCDs until we get CT head to rule out bleed.  Code Status: Full code.  Family Communication: Patient's dad.  Disposition Plan: Admit to inpatient.    Labron Bloodgood N. [facility] Pager [phone].  If 7PM-7AM, please contact night-coverage [email or web address] Password [facility] 05/23/2014, 6:41 AM

## 2014-05-23 NOTE — Progress Notes (Signed)
INITIAL NUTRITION ASSESSMENT  DOCUMENTATION CODES Per approved criteria  -Not Applicable   INTERVENTION: -Initiate Vital AF 1.2 @ 15 ml/hr via OGT and increase by 10 ml every 4 hours to goal rate of 45 ml/hr. -Tube feeding regimen provides 1296 kcal (100% of needs), 81 grams of protein, and 856 ml of H2O.   NUTRITION DIAGNOSIS: Inadequate oral intake related to inability to eat as evidenced by NPO status.   Goal: Pt to meet >/= 90% of their estimated nutrition needs   Monitor:  TF regimen & tolerance, respiratory status, weight, labs, I/O's  Reason for Assessment: Consult for TF adjustment and management  Admitting Dx: Drug overdose, intentional  ASSESSMENT: 24 y/o female with a history of polysubstance abuse was admitted on 1/2 after taking presumably 20 tablets of seroquel in the setting of polysubstance abuse with heroin and cocaine and alcohol.   Patient is currently intubated on ventilator support d/t acute respiratory failure d/t drug overdose MV: 6.1 L/min Temp (24hrs), Avg:97.5 F (36.4 C), Min:97 F (36.1 C), Max:98.1 F (36.7 C)  Propofol: none at this time  Suspect poor quality diet d/t history of drug and ETOH abuse.  Nutrition focused physical exam shows no sign of depletion of muscle mass or body fat.  Labs reviewed: Low K Glucose 205  Height: Ht Readings from Last 1 Encounters:  05/23/14  (1.6 m)    Weight: Wt Readings from Last 1 Encounters:  05/23/14 107 lb 12.9 oz (48.9 kg)    Ideal Body Weight: 115 lb  % Ideal Body Weight: 93%  Wt Readings from Last 10 Encounters:  05/23/14 107 lb 12.9 oz (48.9 kg)  07/15/13 106 lb 6.4 oz (48.263 kg)  07/12/13 102 lb 6.4 oz (46.448 kg)  04/02/13 106 lb 6.4 oz (48.263 kg)  03/28/07 91 lb (41.277 kg) (6 %*, Z = -1.55)   * Growth percentiles are based on CDC 2-20 Years data.    Usual Body Weight: 106 lb  % Usual Body Weight: 100%  BMI:  Body mass index is 19.1 kg/(m^2).  Estimated  Nutritional Needs: Kcal: 1276 Protein: 80-90g Fluid: 1.5L/day  Skin: intact  Diet Order: Diet NPO time specified  EDUCATION NEEDS: -No education needs identified at this time   Intake/Output Summary (Last 24 hours) at 05/23/14 0913 Last data filed at 05/23/14 0832  Gross per 24 hour  Intake 1255.73 ml  Output   1300 ml  Net -44.27 ml    Last BM: PTA  Labs:   Recent Labs Lab 05/23/14 0028 05/23/14 0723  NA 135 141  K 3.8 3.4*  CL 100 103  CO2 24 26  BUN 15 12  CREATININE 0.65 0.73  CALCIUM 9.0 9.0  GLUCOSE 95 205*    CBG (last 3)   Recent Labs  05/23/14 0833  GLUCAP 161*    Scheduled Meds: . antiseptic oral rinse  7 mL Mouth Rinse q12n4p  . chlorhexidine  15 mL Mouth Rinse BID  . diphenhydrAMINE  25 mg Intravenous Q6H  . feeding supplement (VITAL HIGH PROTEIN)  1,000 mL Per Tube Q24H  . fentaNYL  50 mcg Intravenous Once  . folic acid  1 mg Intravenous Daily  . midazolam  4 mg Intravenous Once  . pantoprazole (PROTONIX) IV  40 mg Intravenous Daily  . thiamine  100 mg Intravenous Daily    Continuous Infusions: . sodium chloride 125 mL/hr at 05/23/14 0745  . dexmedetomidine 0.7 mcg/kg/hr (05/23/14 1610)  . fentaNYL infusion INTRAVENOUS 100 mcg/hr (  05/23/14 0820)  . propofol      Past Medical History  Diagnosis Date  . Allergy   . Anxiety   . Depression   . Seizures   . ADHD (attention deficit hyperactivity disorder)     Past Surgical History  Procedure Laterality Date  . Tonsillectomy and adenoidectomy      Tilda Franco, MS, RD, LDN Pager: [phone] After Hours Pager: [phone]

## 2014-05-23 NOTE — ED Notes (Signed)
Pt was placed on O2 at 2LPM via nasal cannula per EDP

## 2014-05-23 NOTE — Progress Notes (Signed)
Unable to assess pt due to thrashing in bed since arrival over an hour ago.  Pt has had multiple medications and still being extremely combative.  Suction set up in room, poison control called and updated.  Watch for QTc and seizures.  Pts father present. Misty Ross

## 2014-05-23 NOTE — ED Notes (Addendum)
Spoke with Alona Bene from [employer] -Observe for tachycardia -Observe for bradycardia-may progress to torsades -Observe for QTc>500 -Check magnesium level if QTc>500 -Seizure precautions -Push fluids Benzo's for seizure activity

## 2014-05-24 DIAGNOSIS — T50902D Poisoning by unspecified drugs, medicaments and biological substances, intentional self-harm, subsequent encounter: Secondary | ICD-10-CM

## 2014-05-24 DIAGNOSIS — R45851 Suicidal ideations: Secondary | ICD-10-CM

## 2014-05-24 DIAGNOSIS — F1490 Cocaine use, unspecified, uncomplicated: Secondary | ICD-10-CM

## 2014-05-24 DIAGNOSIS — T43592A Poisoning by other antipsychotics and neuroleptics, intentional self-harm, initial encounter: Secondary | ICD-10-CM

## 2014-05-24 DIAGNOSIS — F1990 Other psychoactive substance use, unspecified, uncomplicated: Secondary | ICD-10-CM

## 2014-05-24 DIAGNOSIS — F1190 Opioid use, unspecified, uncomplicated: Secondary | ICD-10-CM

## 2014-05-24 DIAGNOSIS — J690 Pneumonitis due to inhalation of food and vomit: Secondary | ICD-10-CM

## 2014-05-24 LAB — CBC
HEMATOCRIT: 30.3 % — AB (ref 36.0–46.0)
HEMOGLOBIN: 10 g/dL — AB (ref 12.0–15.0)
MCH: 28.3 pg (ref 26.0–34.0)
MCHC: 33 g/dL (ref 30.0–36.0)
MCV: 85.8 fL (ref 78.0–100.0)
PLATELETS: 206 10*3/uL (ref 150–400)
RBC: 3.53 MIL/uL — AB (ref 3.87–5.11)
RDW: 14.4 % (ref 11.5–15.5)
WBC: 12.4 10*3/uL — ABNORMAL HIGH (ref 4.0–10.5)

## 2014-05-24 LAB — GLUCOSE, CAPILLARY
GLUCOSE-CAPILLARY: 115 mg/dL — AB (ref 70–99)
Glucose-Capillary: 109 mg/dL — ABNORMAL HIGH (ref 70–99)
Glucose-Capillary: 119 mg/dL — ABNORMAL HIGH (ref 70–99)
Glucose-Capillary: 126 mg/dL — ABNORMAL HIGH (ref 70–99)

## 2014-05-24 LAB — BASIC METABOLIC PANEL
Anion gap: 4 — ABNORMAL LOW (ref 5–15)
BUN: 11 mg/dL (ref 6–23)
CALCIUM: 8 mg/dL — AB (ref 8.4–10.5)
CHLORIDE: 108 meq/L (ref 96–112)
CO2: 23 mmol/L (ref 19–32)
CREATININE: 0.65 mg/dL (ref 0.50–1.10)
Glucose, Bld: 116 mg/dL — ABNORMAL HIGH (ref 70–99)
Potassium: 3.7 mmol/L (ref 3.5–5.1)
Sodium: 135 mmol/L (ref 135–145)

## 2014-05-24 LAB — HIV ANTIBODY (ROUTINE TESTING W REFLEX): HIV 1&2 Ab, 4th Generation: NONREACTIVE

## 2014-05-24 MED ORDER — LORAZEPAM 2 MG/ML IJ SOLN
INTRAMUSCULAR | Status: AC
  Filled 2014-05-24: qty 1

## 2014-05-24 MED ORDER — AMPHETAMINE-DEXTROAMPHETAMINE 10 MG PO TABS
20.0000 mg | ORAL_TABLET | Freq: Every evening | ORAL | Status: DC
  Administered 2014-05-24 – 2014-05-25 (×2): 20 mg via ORAL
  Filled 2014-05-24 (×2): qty 2

## 2014-05-24 MED ORDER — CEFTRIAXONE SODIUM IN DEXTROSE 40 MG/ML IV SOLN
2.0000 g | INTRAVENOUS | Status: DC
  Administered 2014-05-24 – 2014-05-25 (×2): 2 g via INTRAVENOUS
  Filled 2014-05-24 (×3): qty 50

## 2014-05-24 MED ORDER — LORAZEPAM 2 MG/ML IJ SOLN
0.5000 mg | INTRAMUSCULAR | Status: DC | PRN
  Administered 2014-05-24: 1 mg via INTRAVENOUS
  Filled 2014-05-24: qty 1

## 2014-05-24 MED ORDER — LORAZEPAM 2 MG/ML IJ SOLN
1.0000 mg | INTRAMUSCULAR | Status: DC | PRN
  Administered 2014-05-24 – 2014-05-25 (×2): 2 mg via INTRAVENOUS
  Filled 2014-05-24: qty 1

## 2014-05-24 MED ORDER — FENTANYL CITRATE 0.05 MG/ML IJ SOLN: 25.0000 ug | INTRAMUSCULAR | Status: DC | PRN

## 2014-05-24 MED ORDER — NICOTINE 14 MG/24HR TD PT24
14.0000 mg | MEDICATED_PATCH | Freq: Every day | TRANSDERMAL | Status: DC
  Administered 2014-05-24 – 2014-05-26 (×3): 14 mg via TRANSDERMAL
  Filled 2014-05-24 (×3): qty 1

## 2014-05-24 MED ORDER — CLONIDINE HCL 0.1 MG PO TABS
0.2000 mg | ORAL_TABLET | Freq: Every day | ORAL | Status: DC
  Administered 2014-05-24: 0.2 mg via ORAL
  Filled 2014-05-24: qty 2

## 2014-05-24 NOTE — Progress Notes (Signed)
PULMONARY / CRITICAL CARE MEDICINE   Name: Misty Ross MRN: [id number] DOB: 1990/12/17    ADMISSION DATE:  05/22/2014 CONSULTATION DATE:  05/23/2014  REFERRING MD :  Toniann Fail [facility]  CHIEF COMPLAINT:  Agitation, suicide attempt  INITIAL PRESENTATION: 24 y/o female with a history of polysubstance abuse was admitted on 1/2 after taking presumably 20 tablets of seroquel.  UDS found to be positive for multiple illicit drugs.  She was admitted to the ICU and PCCM was consulted for worsening encephalopathy and agitation.  STUDIES:    SIGNIFICANT EVENTS: 1/1 admission 1/2 intubated 1/3 extubated  SUBJECTIVE: Passing SBT, following commands, tearful  VITAL SIGNS: Temp:  [97.2 F (36.2 C)-103.5 F (39.7 C)] 101.5 F (38.6 C) (01/03 0900) Pulse Rate:  [90-147] 119 (01/03 0900) Resp:  [14-24] 17 (01/03 0900) BP: (93-154)/(49-106) 123/59 mmHg (01/03 0900) SpO2:  [97 %-100 %] 97 % (01/03 0900) FiO2 (%):  [40 %-100 %] 40 % (01/03 0930) HEMODYNAMICS:   VENTILATOR SETTINGS: Vent Mode:  [-] CPAP;PSV FiO2 (%):  [40 %-100 %] 40 % Set Rate:  [14 bmp] 14 bmp Vt Set:  [420 mL] 420 mL PEEP:  [5 cmH20] 5 cmH20 Pressure Support:  [5 cmH20] 5 cmH20 Plateau Pressure:  [13 cmH20-17 cmH20] 16 cmH20 INTAKE / OUTPUT:  Intake/Output Summary (Last 24 hours) at 05/24/14 1018 Last data filed at 05/24/14 0900  Gross per 24 hour  Intake 4201.17 ml  Output   1440 ml  Net 2761.17 ml    PHYSICAL EXAMINATION: General:  Sonorous but arouses to voice, follows commands on vent HEENT: NCAT EOMi, ETT in place PULM: CTA B CV: RRR, no mgr AB: BS+, soft, nontender Ext: warm, no edema Derm: dry, no rash Neuro: arouses to voice, follows commands  LABS:  CBC  Recent Labs Lab 05/23/14 0028 05/24/14 0403  WBC 6.1 12.4*  HGB 12.9 10.0*  HCT 39.4 30.3*  PLT 237 206   Coag's No results for input(s): APTT, INR in the last 168 hours. BMET  Recent Labs Lab 05/23/14 0028 05/23/14 0723  05/24/14 0403  NA 135 141 135  K 3.8 3.4* 3.7  CL 100 103 108  CO2 BUN CREATININE 0.65 0.73 0.65  GLUCOSE 95 205* 116*   Electrolytes  Recent Labs Lab 05/23/14 0028 05/23/14 0723 05/24/14 0403  CALCIUM 9.0 9.0 8.0*   Sepsis Markers  Recent Labs Lab 05/23/14 0723  LATICACIDVEN 2.1   ABG  Recent Labs Lab 05/23/14 0440 05/23/14 0957  PHART 7.471* 7.410  PCO2ART 34.0* 38.7  PO2ART 105.0* 79.4*   Liver Enzymes  Recent Labs Lab 05/23/14 0028 05/23/14 0723  AST 32 31  ALT 24 26  ALKPHOS 60 65  BILITOT 1.0 0.8  ALBUMIN 4.1 4.3   Cardiac Enzymes No results for input(s): TROPONINI, PROBNP in the last 168 hours. Glucose  Recent Labs Lab 05/23/14 1255 05/23/14 1637 05/23/14 1704 05/23/14 1952 05/23/14 2339 05/24/14 0340  GLUCAP 204* 64* 229* 99 126* 119*    Imaging 1/2 CXR images reviewed> ETT in place, ? Hazy RLL infiltrate EKG: Sinus tach, QTc 437, no ST wave change  ASSESSMENT / PLAN:  Toxicology: A: Cocaine, heroin, seroquel abuse/overdose > clinically improved without evidence of end organ damage P: Continue supportive care  Psyche: A: Suicide attempt Anxiety/Depression ADHD P: Psychiatry consult today Ativan prn anxiety  PULMONARY OETT 1/2 >> A: Acute respiratory failure due to drug overdose > resolved Aspiration pneumonitis vs pneumonia P:  Extubate this morning See ID OK to advance diet in 4 hours CXR in AM to follow infiltrate  CARDIOVASCULAR CVL n/a A: Sinus tachycardia> resolved P:  KVO fluids  RENAL A:  No acute issues P:   Monitor BMET and UOP Replace electrolytes as needed  GASTROINTESTINAL A:  No acute issues P:   OK to advance diet after extubation  HEMATOLOGIC A:  No acute issues P:  Monitor for bleeding  INFECTIOUS A:  Aspiration pneumonitis P:   1/2 blood cx > 1/3 resp cx > 1/2 urine cx >  Ceftriaxone 1/2 > will start given ongoing fever, consider d/c 1/4 if CXR clear  and no cough  ENDOCRINE A:  No acute issues   P:   Monitor blood glucose  NEUROLOGIC A:  Acute encephalopathy due to drug overdose (agitated delirium requiring sedation) > resolved Opiate dependence Cocaine use History of EtOH use P:   D/c sedation protocol and continuous infusions Continue prn fentanyl for pain, signs of narcotic withdrawal  FAMILY  - Updates: Father updated at bedside by me 1/3 AM   TODAY'S SUMMARY: 24 y/o female with what appears to be an intentional overdose of seroquel in the setting of polysubstance abuse with heroin and cocaine and alcohol.   Extubate today, psyche consult, advance diet.  CC time by me 35 minutes   Heber Hanoverton, MD [facility] Pager: [phone] Cell: [phone] If no response, call [phone]   05/24/2014, 10:18 AM

## 2014-05-24 NOTE — Progress Notes (Signed)
eLink Physician-Brief Progress Note Patient Name: Latroya Ng DOB: [birth year] MRN: [id number]   Date of Service  05/24/2014  HPI/Events of Note  Agitated, tachy, prob withdrawal  eICU Interventions  Resume adderall, clonidine Increase ativan 1-2 q 3h     Intervention Category Major Interventions: Delirium, psychosis, severe agitation - evaluation and management  Chenoa Luddy V. 05/24/2014, 9:33 PM

## 2014-05-24 NOTE — Consult Note (Signed)
[facility] Face-to-Face Psychiatry Consult   Reason for Consult:  Suicide attempt Referring Physician:  Dr. Arcelia Jew [name] is an 24 y.o. female. Total Time spent with patient: 45 minutes  Assessment: AXIS I:  Rule out Major Depression, Rule out Substance dependence and Polysubstance dependence. Suicide attempt AXIS II:  Deferred AXIS III:   Past Medical History  Diagnosis Date  . Allergy   . Anxiety   . Depression   . Seizures   . ADHD (attention deficit hyperactivity disorder)    AXIS IV:  economic problems, other psychosocial or environmental problems and problems related to social environment AXIS V:  21-30 behavior considerably influenced by delusions or hallucinations OR serious impairment in judgment, communication OR inability to function in almost all areas  Plan:  Recommend psychiatric Inpatient admission when medically cleared.  Patient still obtunded and not communicating. Just extubated one hour ago. Would need to involve social services to look for psychiatry hospitals if bed not available at [facility] for admission.    Subjective:   Misty Ross is a 24 y.o. female patient admitted with suicide attempt.  HPI:  Misty Ross is a 24 y.o. female with a history of polysubstance abuse and ADHD was brought initially into the ER the patient stated to her that that she had consumed 20 pills of Seroquel dose of which was not clear. In addition a urine drug screen shows positive for cocaine opiates benzos and marijuana. She was obtunded and needed intubation.  Patient extubated an hour ago. Parents were in room. Patient still not communicating and confused. Most info got from parents. Mom says she had fight with her brother over drugs and other arguments. She went into her room and took pills.  Mom says she has continued to use opiates, cocaine and other substances despite going thru prior Rehab. No regular follow up for depression or medications. Drug use remained a concern and she has not  been sober. Parents denied any history of psychosis. They acknolwedge she has been admitted to [facility] few years ago for depression.   HPI Elements:   Location:  depression. Quality:  severe. Severity:  suicide attempt.  Past Psychiatric History: Past Medical History  Diagnosis Date  . Allergy   . Anxiety   . Depression   . Seizures   . ADHD (attention deficit hyperactivity disorder)     reports that she has been smoking Cigarettes.  She has been smoking about 0.50 packs per day. She does not have any smokeless tobacco history on file. She reports that she drinks alcohol. She reports that she uses illicit drugs. Family History  Problem Relation Age of Onset  . Hyperlipidemia Father   . Cancer Maternal Grandmother   . Diabetes Maternal Grandmother   . Cancer Maternal Grandfather   . Heart disease Paternal Grandmother   . Cancer Paternal Grandfather   . Multiple sclerosis Brother            Allergies:  No Known Allergies  ACT Assessment Complete:  Yes:    Educational Status    Risk to Self: Risk to self with the past 6 months Is patient at risk for suicide?: No, but patient needs Medical Clearance Substance abuse history and/or treatment for substance abuse?: Yes  Risk to Others:    Abuse:    Prior Inpatient Therapy:    Prior Outpatient Therapy:    Additional Information:  Objective: Blood pressure 119/77, pulse 124, temperature 101.1 F (38.4 C), temperature source Core (Comment), resp. rate 17, height 5' 3"  (1.6 m), weight 48.9 kg (107 lb 12.9 oz), SpO2 94 %.Body mass index is 19.1 kg/(m^2). Results for orders placed or performed during the hospital encounter of 05/22/14 (from the past 72 hour(s))  CBC with Differential     Status: None   Collection Time: 05/23/14 12:28 AM  Result Value Ref Range   WBC 6.1 4.0 - 10.5 K/uL   RBC 4.62 3.87 - 5.11 MIL/uL   Hemoglobin 12.9 12.0 - 15.0 g/dL   HCT 39.4 36.0 - 46.0 %   MCV 85.3 78.0 - 100.0  fL   MCH 27.9 26.0 - 34.0 pg   MCHC 32.7 30.0 - 36.0 g/dL   RDW 13.9 11.5 - 15.5 %   Platelets 237 150 - 400 K/uL   Neutrophils Relative % 63 43 - 77 %   Neutro Abs 3.9 1.7 - 7.7 K/uL   Lymphocytes Relative 25 12 - 46 %   Lymphs Abs 1.5 0.7 - 4.0 K/uL   Monocytes Relative 10 3 - 12 %   Monocytes Absolute 0.6 0.1 - 1.0 K/uL   Eosinophils Relative 2 0 - 5 %   Eosinophils Absolute 0.1 0.0 - 0.7 K/uL   Basophils Relative 0 0 - 1 %   Basophils Absolute 0.0 0.0 - 0.1 K/uL  Comprehensive metabolic panel     Status: None   Collection Time: 05/23/14 12:28 AM  Result Value Ref Range   Sodium 135 135 - 145 mmol/L    Comment: Please note change in reference range.   Potassium 3.8 3.5 - 5.1 mmol/L    Comment: Please note change in reference range.   Chloride 100 96 - 112 mEq/L   CO2 24 19 - 32 mmol/L   Glucose, Bld 95 70 - 99 mg/dL   BUN 15 6 - 23 mg/dL   Creatinine, Ser 0.65 0.50 - 1.10 mg/dL   Calcium 9.0 8.4 - 10.5 mg/dL   Total Protein 7.2 6.0 - 8.3 g/dL   Albumin 4.1 3.5 - 5.2 g/dL   AST 32 0 - 37 U/L   ALT 24 0 - 35 U/L   Alkaline Phosphatase 60 39 - 117 U/L   Total Bilirubin 1.0 0.3 - 1.2 mg/dL   GFR calc non Af Amer >90 >90 mL/min   GFR calc Af Amer >90 >90 mL/min    Comment: (NOTE) The eGFR has been calculated using the CKD EPI equation. This calculation has not been validated in all clinical situations. eGFR's persistently <90 mL/min signify possible Chronic Kidney Disease.    Anion gap 11 5 - 15  Ethanol     Status: None   Collection Time: 05/23/14 12:28 AM  Result Value Ref Range   Alcohol, Ethyl (B) <5 0 - 9 mg/dL    Comment:        LOWEST DETECTABLE LIMIT FOR SERUM ALCOHOL IS 11 mg/dL FOR MEDICAL PURPOSES ONLY   Acetaminophen level     Status: Abnormal   Collection Time: 05/23/14 12:28 AM  Result Value Ref Range   Acetaminophen (Tylenol), Serum <10.0 (L) 10 - 30 ug/mL    Comment:        THERAPEUTIC CONCENTRATIONS VARY SIGNIFICANTLY. A RANGE OF 10-30 ug/mL  MAY BE AN EFFECTIVE CONCENTRATION FOR MANY PATIENTS. HOWEVER, SOME ARE BEST TREATED AT CONCENTRATIONS OUTSIDE THIS RANGE. ACETAMINOPHEN CONCENTRATIONS >150 ug/mL AT 4 HOURS AFTER INGESTION AND >50 ug/mL  AT 12 HOURS AFTER INGESTION ARE OFTEN ASSOCIATED WITH TOXIC REACTIONS.   Salicylate level     Status: None   Collection Time: 05/23/14 12:28 AM  Result Value Ref Range   Salicylate Lvl <5.9 2.8 - 20.0 mg/dL  Drug screen panel, emergency     Status: Abnormal   Collection Time: 05/23/14  1:17 AM  Result Value Ref Range   Opiates POSITIVE (A) NONE DETECTED   Cocaine POSITIVE (A) NONE DETECTED   Benzodiazepines POSITIVE (A) NONE DETECTED   Amphetamines NONE DETECTED NONE DETECTED   Tetrahydrocannabinol POSITIVE (A) NONE DETECTED   Barbiturates NONE DETECTED NONE DETECTED    Comment:        DRUG SCREEN FOR MEDICAL PURPOSES ONLY.  IF CONFIRMATION IS NEEDED FOR ANY PURPOSE, NOTIFY LAB WITHIN 5 DAYS.        LOWEST DETECTABLE LIMITS FOR URINE DRUG SCREEN Drug Class       Cutoff (ng/mL) Amphetamine      1000 Barbiturate      200 Benzodiazepine   563 Tricyclics       875 Opiates          300 Cocaine          300 THC              50   Blood gas, arterial     Status: Abnormal   Collection Time: 05/23/14  4:40 AM  Result Value Ref Range   FIO2 0.21 %   Delivery systems ROOM AIR    pH, Arterial 7.471 (H) 7.350 - 7.450   pCO2 arterial 34.0 (L) 35.0 - 45.0 mmHg   pO2, Arterial 105.0 (H) 80.0 - 100.0 mmHg   Bicarbonate 24.6 (H) 20.0 - 24.0 mEq/L   TCO2 21.9 0 - 100 mmol/L   Acid-Base Excess 1.7 0.0 - 2.0 mmol/L   O2 Saturation 98.3 %   Patient temperature 98.0    Collection site RIGHT RADIAL    Drawn by [id number]    Sample type ARTERIAL    Allens test (pass/fail) PASS PASS  MRSA PCR Screening     Status: None   Collection Time: 05/23/14  5:29 AM  Result Value Ref Range   MRSA by PCR NEGATIVE NEGATIVE    Comment:        The GeneXpert MRSA Assay (FDA approved for NASAL  specimens only), is one component of a comprehensive MRSA colonization surveillance program. It is not intended to diagnose MRSA infection nor to guide or monitor treatment for MRSA infections.   Lactic acid, plasma     Status: None   Collection Time: 05/23/14  7:23 AM  Result Value Ref Range   Lactic Acid, Venous 2.1 0.5 - 2.2 mmol/L  CK     Status: None   Collection Time: 05/23/14  7:23 AM  Result Value Ref Range   Total CK 66 7 - 177 U/L  Ammonia     Status: Abnormal   Collection Time: 05/23/14  7:23 AM  Result Value Ref Range   Ammonia <9 (L) 11 - 32 umol/L    Comment: Please note change in reference range.  Comprehensive metabolic panel     Status: Abnormal   Collection Time: 05/23/14  7:23 AM  Result Value Ref Range   Sodium 141 135 - 145 mmol/L    Comment: Please note change in reference range.   Potassium 3.4 (L) 3.5 - 5.1 mmol/L    Comment: Please note change in reference range.   Chloride  103 96 - 112 mEq/L   CO2 26 19 - 32 mmol/L   Glucose, Bld 205 (H) 70 - 99 mg/dL   BUN 12 6 - 23 mg/dL   Creatinine, Ser 0.73 0.50 - 1.10 mg/dL   Calcium 9.0 8.4 - 10.5 mg/dL   Total Protein 7.6 6.0 - 8.3 g/dL   Albumin 4.3 3.5 - 5.2 g/dL   AST 31 0 - 37 U/L   ALT 26 0 - 35 U/L   Alkaline Phosphatase 65 39 - 117 U/L   Total Bilirubin 0.8 0.3 - 1.2 mg/dL   GFR calc non Af Amer >90 >90 mL/min   GFR calc Af Amer >90 >90 mL/min    Comment: (NOTE) The eGFR has been calculated using the CKD EPI equation. This calculation has not been validated in all clinical situations. eGFR's persistently <90 mL/min signify possible Chronic Kidney Disease.    Anion gap 12 5 - 15  Acetaminophen level     Status: Abnormal   Collection Time: 05/23/14  7:23 AM  Result Value Ref Range   Acetaminophen (Tylenol), Serum <10.0 (L) 10 - 30 ug/mL    Comment:        THERAPEUTIC CONCENTRATIONS VARY SIGNIFICANTLY. A RANGE OF 10-30 ug/mL MAY BE AN EFFECTIVE CONCENTRATION FOR MANY PATIENTS. HOWEVER,  SOME ARE BEST TREATED AT CONCENTRATIONS OUTSIDE THIS RANGE. ACETAMINOPHEN CONCENTRATIONS >150 ug/mL AT 4 HOURS AFTER INGESTION AND >50 ug/mL AT 12 HOURS AFTER INGESTION ARE OFTEN ASSOCIATED WITH TOXIC REACTIONS.   Salicylate level     Status: None   Collection Time: 05/23/14  7:23 AM  Result Value Ref Range   Salicylate Lvl <2.5 2.8 - 20.0 mg/dL  HIV antibody     Status: None   Collection Time: 05/23/14  7:23 AM  Result Value Ref Range   HIV 1&2 Ab, 4th Generation NONREACTIVE NONREACTIVE    Comment: (NOTE) A NONREACTIVE HIV Ag/Ab result does not exclude HIV infection since the time frame for seroconversion is variable. If acute HIV infection is suspected, a HIV-1 RNA Qualitative TMA test is recommended. HIV-1/2 Antibody Diff         Not indicated. HIV-1 RNA, Qual TMA           Not indicated. PLEASE NOTE: This information has been disclosed to you from records whose confidentiality may be protected by state law. If your state requires such protection, then the state law prohibits you from making any further disclosure of the information without the specific written consent of the person to whom it pertains, or as otherwise permitted by law. A general authorization for the release of medical or other information is NOT sufficient for this purpose. The performance of this assay has not been clinically validated in patients less than 29 years old. Performed at [facility]   hCG, quantitative, pregnancy     Status: None   Collection Time: 05/23/14  7:23 AM  Result Value Ref Range   hCG, Beta Chain, Quant, S <1 <5 mIU/mL    Comment:          GEST. AGE      CONC.  (mIU/mL)   <=1 WEEK        5 - 50     2 WEEKS       50 - 500     3 WEEKS       100 - 10,000     4 WEEKS     1,000 - 30,000     5  WEEKS     3,500 - 115,000   6-8 WEEKS     12,000 - 270,000    12 WEEKS     15,000 - 220,000        FEMALE AND NON-PREGNANT FEMALE:     LESS THAN 5 mIU/mL   Glucose, capillary      Status: Abnormal   Collection Time: 05/23/14  8:33 AM  Result Value Ref Range   Glucose-Capillary 161 (H) 70 - 99 mg/dL  Pregnancy, urine     Status: None   Collection Time: 05/23/14  8:55 AM  Result Value Ref Range   Preg Test, Ur NEGATIVE NEGATIVE    Comment:        THE SENSITIVITY OF THIS METHODOLOGY IS >20 mIU/mL.   Draw ABG 1 hour after initiation of ventilator     Status: Abnormal   Collection Time: 05/23/14  9:57 AM  Result Value Ref Range   FIO2 1.00 %   Delivery systems VENTILATOR    Mode PRESSURE REGULATED VOLUME CONTROL    VT 420 mL   Rate 14 resp/min   Peep/cpap 5.0 cm H20   pH, Arterial 7.410 7.350 - 7.450   pCO2 arterial 38.7 35.0 - 45.0 mmHg   pO2, Arterial 79.4 (L) 80.0 - 100.0 mmHg   Bicarbonate 24.0 20.0 - 24.0 mEq/L   TCO2 22.1 0 - 100 mmol/L   Acid-Base Excess 0.0 0.0 - 2.0 mmol/L   O2 Saturation 96.4 %   Patient temperature 37.0    Collection site LEFT RADIAL    Drawn by COLLECTED BY RT    Sample type ARTERIAL DRAW    Allens test (pass/fail) PASS PASS  Glucose, capillary     Status: Abnormal   Collection Time: 05/23/14 12:10 PM  Result Value Ref Range   Glucose-Capillary 61 (L) 70 - 99 mg/dL   Comment 1 Documented in Chart    Comment 2 Notify RN   Glucose, capillary     Status: Abnormal   Collection Time: 05/23/14 12:55 PM  Result Value Ref Range   Glucose-Capillary 204 (H) 70 - 99 mg/dL  Glucose, capillary     Status: Abnormal   Collection Time: 05/23/14  4:37 PM  Result Value Ref Range   Glucose-Capillary 64 (L) 70 - 99 mg/dL  Glucose, capillary     Status: Abnormal   Collection Time: 05/23/14  5:04 PM  Result Value Ref Range   Glucose-Capillary 229 (H) 70 - 99 mg/dL  Glucose, capillary     Status: None   Collection Time: 05/23/14  7:52 PM  Result Value Ref Range   Glucose-Capillary 99 70 - 99 mg/dL   Comment 1 Notify RN   Urinalysis, Routine w reflex microscopic     Status: None   Collection Time: 05/23/14 11:25 PM  Result Value Ref  Range   Color, Urine YELLOW YELLOW   APPearance CLEAR CLEAR   Specific Gravity, Urine 1.015 1.005 - 1.030   pH 5.0 5.0 - 8.0   Glucose, UA NEGATIVE NEGATIVE mg/dL   Hgb urine dipstick NEGATIVE NEGATIVE   Bilirubin Urine NEGATIVE NEGATIVE   Ketones, ur NEGATIVE NEGATIVE mg/dL   Protein, ur NEGATIVE NEGATIVE mg/dL   Urobilinogen, UA 0.2 0.0 - 1.0 mg/dL   Nitrite NEGATIVE NEGATIVE   Leukocytes, UA NEGATIVE NEGATIVE    Comment: MICROSCOPIC NOT DONE ON URINES WITH NEGATIVE PROTEIN, BLOOD, LEUKOCYTES, NITRITE, OR GLUCOSE <1000 mg/dL.  Glucose, capillary     Status: Abnormal   Collection Time:  05/23/14 11:39 PM  Result Value Ref Range   Glucose-Capillary 126 (H) 70 - 99 mg/dL  Glucose, capillary     Status: Abnormal   Collection Time: 05/24/14  3:40 AM  Result Value Ref Range   Glucose-Capillary 119 (H) 70 - 99 mg/dL   Comment 1 Notify RN   Basic metabolic panel     Status: Abnormal   Collection Time: 05/24/14  4:03 AM  Result Value Ref Range   Sodium 135 135 - 145 mmol/L    Comment: Please note change in reference range.   Potassium 3.7 3.5 - 5.1 mmol/L    Comment: Please note change in reference range.   Chloride 108 96 - 112 mEq/L   CO2 23 19 - 32 mmol/L   Glucose, Bld 116 (H) 70 - 99 mg/dL   BUN 11 6 - 23 mg/dL   Creatinine, Ser 0.65 0.50 - 1.10 mg/dL   Calcium 8.0 (L) 8.4 - 10.5 mg/dL   GFR calc non Af Amer >90 >90 mL/min   GFR calc Af Amer >90 >90 mL/min    Comment: (NOTE) The eGFR has been calculated using the CKD EPI equation. This calculation has not been validated in all clinical situations. eGFR's persistently <90 mL/min signify possible Chronic Kidney Disease.    Anion gap 4 (L) 5 - 15  CBC     Status: Abnormal   Collection Time: 05/24/14  4:03 AM  Result Value Ref Range   WBC 12.4 (H) 4.0 - 10.5 K/uL   RBC 3.53 (L) 3.87 - 5.11 MIL/uL   Hemoglobin 10.0 (L) 12.0 - 15.0 g/dL    Comment: DELTA CHECK NOTED REPEATED TO VERIFY    HCT 30.3 (L) 36.0 - 46.0 %   MCV  85.8 78.0 - 100.0 fL   MCH 28.3 26.0 - 34.0 pg   MCHC 33.0 30.0 - 36.0 g/dL   RDW 14.4 11.5 - 15.5 %   Platelets 206 150 - 400 K/uL  Glucose, capillary     Status: Abnormal   Collection Time: 05/24/14  8:05 AM  Result Value Ref Range   Glucose-Capillary 115 (H) 70 - 99 mg/dL   Comment 1 Documented in Chart    Comment 2 Notify RN   Glucose, capillary     Status: Abnormal   Collection Time: 05/24/14 12:21 PM  Result Value Ref Range   Glucose-Capillary 109 (H) 70 - 99 mg/dL   Comment 1 Documented in Chart    Comment 2 Notify RN    Labs are reviewed and are pertinent for UDS positive for cocaine, opiates and marijuana.  Current Facility-Administered Medications  Medication Dose Route Frequency Provider Last Rate Last Dose  . acetaminophen (TYLENOL) tablet 650 mg  650 mg Oral Q6H PRN Rise Patience, MD       Or  . acetaminophen (TYLENOL) suppository 650 mg  650 mg Rectal Q6H PRN Rise Patience, MD   650 mg at 05/24/14 0906  . cefTRIAXone (ROCEPHIN) 2 g in dextrose 5 % 50 mL IVPB - Premix  2 g Intravenous Q24H Juanito Doom, MD   2 g at 05/24/14 1157  . enoxaparin (LOVENOX) injection 40 mg  40 mg Subcutaneous Q24H Juanito Doom, MD   40 mg at 05/24/14 1200  . feeding supplement (VITAL AF 1.2 CAL) liquid 1,000 mL  1,000 mL Per Tube Continuous Clayton Bibles, RD   Stopped at 05/24/14 1013  . fentaNYL (SUBLIMAZE) injection 25-100 mcg  25-100 mcg Intravenous Q2H PRN  Juanito Doom, MD      . fentaNYL (SUBLIMAZE) injection 50 mcg  50 mcg Intravenous Once Juanito Doom, MD   50 mcg at 05/23/14 0745  . folic acid injection 1 mg  1 mg Intravenous Daily Juanito Doom, MD   1 mg at 05/24/14 1020  . Influenza vac split quadrivalent PF (FLUARIX) injection 0.5 mL  0.5 mL Intramuscular Tomorrow-1000 Juanito Doom, MD   0.5 mL at 05/24/14 1000  . LORazepam (ATIVAN) injection 0.5-1 mg  0.5-1 mg Intravenous Q3H PRN Juanito Doom, MD      . midazolam (VERSED) injection 4  mg  4 mg Intravenous Once Juanito Doom, MD   4 mg at 05/23/14 0830  . ondansetron (ZOFRAN) tablet 4 mg  4 mg Oral Q6H PRN Rise Patience, MD       Or  . ondansetron [facility]) injection 4 mg  4 mg Intravenous Q6H PRN Rise Patience, MD      . thiamine (B-1) injection 100 mg  100 mg Intravenous Daily Juanito Doom, MD   100 mg at 05/24/14 1019    Psychiatric Specialty Exam:     Blood pressure 119/77, pulse 124, temperature 101.1 F (38.4 C), temperature source Core (Comment), resp. rate 17, height 5' 3"  (1.6 m), weight 48.9 kg (107 lb 12.9 oz), SpO2 94 %.Body mass index is 19.1 kg/(m^2).  General Appearance: Casual  Eye Contact::  Poor  Speech:  Slow  Volume:  Decreased  Mood:  Dysphoric  Affect:  Congruent  Thought Process:  could not assess. Patient arousable but not communicating  Orientation:  NA  Thought Content:  Rumination  Suicidal Thoughts:  Yes.  with intent/plan and attempt at admission.   Homicidal Thoughts:  No  Memory:  could not assess  Judgement:  Poor  Insight:  Lacking  Psychomotor Activity:  Decreased  Concentration:  could not assess  Recall:  Poor  Fund of Knowledge:NA  Language: NA  Akathisia:  Negative  Handed:  Right  AIMS (if indicated):     Assets:  Social Support  Sleep:      Musculoskeletal: Strength & Muscle Tone: within normal limits Gait & Station: in bed Patient leans: N/A  Treatment Plan Summary: IVC to keep her in hospital. Admit to [facility] [place] for bed availability once completely medically stable and cleared.  Involve social services to look for bed availability outside [facility], Monitor for any withdrawals off Opiates depending upon her prior usage and duration. Withdrawals may continue to complicate her confusion. Thank you for consult.   Arlet Marter 05/24/2014 1:04 PM

## 2014-05-24 NOTE — Procedures (Signed)
Extubation Procedure Note  Patient Details:   NameFlois Mctaguee Oetken DOB: 1991/02/29 MRN: [id number]   Airway Documentation:     Evaluation  O2 sats: stable throughout Complications: No apparent complications Patient did tolerate procedure well. Bilateral Breath Sounds: Clear, Diminished   Yes  [name] 05/24/2014, 10:33 AM   PT able to whisper at this time.

## 2014-05-25 ENCOUNTER — Inpatient Hospital Stay (HOSPITAL_COMMUNITY): Payer: BC Managed Care – PPO

## 2014-05-25 DIAGNOSIS — T1491 Suicide attempt: Secondary | ICD-10-CM

## 2014-05-25 DIAGNOSIS — T50902S Poisoning by unspecified drugs, medicaments and biological substances, intentional self-harm, sequela: Secondary | ICD-10-CM

## 2014-05-25 LAB — GLUCOSE, CAPILLARY
GLUCOSE-CAPILLARY: 94 mg/dL (ref 70–99)
GLUCOSE-CAPILLARY: 76 mg/dL (ref 70–99)
Glucose-Capillary: 111 mg/dL — ABNORMAL HIGH (ref 70–99)
Glucose-Capillary: 89 mg/dL (ref 70–99)
Glucose-Capillary: 90 mg/dL (ref 70–99)
Glucose-Capillary: 97 mg/dL (ref 70–99)
Glucose-Capillary: 94 mg/dL (ref 70–99)

## 2014-05-25 LAB — URINE CULTURE
Colony Count: NO GROWTH
Culture: NO GROWTH
Special Requests: NORMAL

## 2014-05-25 MED ORDER — HALOPERIDOL LACTATE 5 MG/ML IJ SOLN
INTRAMUSCULAR | Status: AC
  Filled 2014-05-25: qty 1

## 2014-05-25 MED ORDER — LORAZEPAM 2 MG/ML IJ SOLN
0.5000 mg | INTRAMUSCULAR | Status: DC | PRN
  Administered 2014-05-26: 0.5 mg via INTRAVENOUS
  Filled 2014-05-25: qty 1

## 2014-05-25 MED ORDER — CLONIDINE HCL 0.1 MG PO TABS
0.1000 mg | ORAL_TABLET | Freq: Three times a day (TID) | ORAL | Status: DC
  Administered 2014-05-25 – 2014-05-26 (×3): 0.1 mg via ORAL
  Filled 2014-05-25 (×4): qty 1

## 2014-05-25 MED ORDER — HALOPERIDOL LACTATE 5 MG/ML IJ SOLN
5.0000 mg | Freq: Once | INTRAMUSCULAR | Status: AC
  Administered 2014-05-25: 5 mg via INTRAVENOUS

## 2014-05-25 MED ORDER — DEXMEDETOMIDINE HCL IN NACL 200 MCG/50ML IV SOLN
0.2000 ug/kg/h | INTRAVENOUS | Status: DC
  Administered 2014-05-25: 0.2 ug/kg/h via INTRAVENOUS
  Administered 2014-05-25: 1.2 ug/kg/h via INTRAVENOUS
  Administered 2014-05-25: 0.2 ug/kg/h via INTRAVENOUS
  Filled 2014-05-25 (×4): qty 50

## 2014-05-25 MED ORDER — VITAMINS A & D EX OINT
TOPICAL_OINTMENT | CUTANEOUS | Status: AC
  Administered 2014-05-25: 2
  Filled 2014-05-25: qty 10

## 2014-05-25 MED ORDER — HALOPERIDOL LACTATE 5 MG/ML IJ SOLN
10.0000 mg | INTRAMUSCULAR | Status: DC | PRN
  Administered 2014-05-25: 10 mg via INTRAVENOUS
  Filled 2014-05-25: qty 2

## 2014-05-25 NOTE — Progress Notes (Signed)
eLink Physician-Brief Progress Note Patient Name: Verla Bryngelson DOB: 1990/07/12 MRN: [id number]   Date of Service  05/25/2014  HPI/Events of Note  Patient with s/s of withdrawal.  Agitated, pulling at lines, yelling, uncooperative with staff.  Ativan dosing without impact.  Added back patient's adderal earlier.  eICU Interventions  Plan: Start precedex IV Obtain 12 lead EKG Consider haldol     Intervention Category Major Interventions: Delirium, psychosis, severe agitation - evaluation and management  DETERDING,ELIZABETH 05/25/2014, 12:05 AM

## 2014-05-25 NOTE — Progress Notes (Signed)
eLink Physician-Brief Progress Note Patient Name: Verne Cove DOB: Nov 19, 1990 MRN: [id number]   Date of Service  05/25/2014  HPI/Events of Note  Hungry, off precedex  eICU Interventions  Advance diet     Intervention Category Minor Interventions: Routine modifications to care plan (e.g. PRN medications for pain, fever)  Khye Hochstetler 05/25/2014, 5:31 PM

## 2014-05-25 NOTE — [facility] Notes (Addendum)
Clinical Social Work Department CLINICAL SOCIAL WORK PSYCHIATRY SERVICE LINE ASSESSMENT 05/25/2014  Patient:  Misty Ross  Account:  [id number]  Admit Date:  05/22/2014  Clinical Social Worker:  Sindy Messing, LCSW  Date/Time:  05/25/2014 02:00 PM Referred by:  Physician  Date referred:  05/25/2014 Reason for Referral  Psychosocial assessment   Presenting Symptoms/Problems (In the person's/family's own words):   Psych consulted due to overdose.   Abuse/Neglect/Trauma History (check all that apply)  Denies history   Abuse/Neglect/Trauma Comments:   Psychiatric History (check all that apply)  Residential treatment  Outpatient treatment   Psychiatric medications:  Adderall 20 mg   Current Mental Health Hospitalizations/Previous Mental Health History:   Patient reports she was diagnosed with ADHD and receives medication management and therapy.   Current provider:   [facility]   Place and Date:   [place], [place]   Current Medications:   Scheduled Meds:      . amphetamine-dextroamphetamine  20 mg Oral QPM  . cefTRIAXone (ROCEPHIN)  IV  2 g Intravenous Q24H  . cloNIDine  0.1 mg Oral TID  . enoxaparin (LOVENOX) injection  40 mg Subcutaneous Q24H  . folic acid  1 mg Intravenous Daily  . Influenza vac split quadrivalent PF  0.5 mL Intramuscular Tomorrow-1000  . nicotine  14 mg Transdermal Daily  . thiamine  100 mg Intravenous Daily        Continuous Infusions:      . dexmedetomidine Stopped (05/25/14 1028)          PRN Meds:.acetaminophen **OR** acetaminophen, LORazepam, ondansetron **OR** ondansetron (ZOFRAN) IV       Previous Impatient Admission/Date/Reason:   Patient reports she went to the [place] to a rehab facility for 3 months due to heroin use. Per chart review, patient had stay at [facility] in 2008.   Emotional Health / Current Symptoms    Suicide/Self Harm  None reported   Suicide attempt in the past:   Patient denies any suicide attempts. Patient  denies that this admission was related to trying to harm herself. Patient reports she was "trying to get high" and was not trying to kill herself. Patient denies any SI or HI.   Other harmful behavior:   None reported   Psychotic/Dissociative Symptoms  None reported   Other Psychotic/Dissociative Symptoms:    Attention/Behavioral Symptoms  Withdrawn   Other Attention / Behavioral Symptoms:   Patient laying in bed and kept eyes closed throughout assessment. Patient speaks softly and responds with short answers.    Cognitive Impairment  Within Normal Limits   Other Cognitive Impairment:   Patient alert and oriented.    Mood and Adjustment  Flat    Stress, Anxiety, Trauma, Any Recent Loss/Stressor  None reported   Anxiety (frequency):   N/A   Phobia (specify):   N/A   Compulsive behavior (specify):   N/A   Obsessive behavior (specify):   N/A   Other:   N/A   Substance Abuse/Use  Current substance use   SBIRT completed (please refer for detailed history):  Y  Self-reported substance use:   Patient reports she has been using heroin since she was 24 years old. Patient reports she uses about 3 times a week now. Patient denies any other substance use but had positive screen for benzodiazepines, opiates, cocaine, and THC.   Urinary Drug Screen Completed:  Y Alcohol level:   <5    Environmental/Housing/Living Arrangement  Stable housing   Who is in the home:  Mom, Dad, and 57 yo brother   Emergency contact:  Craig   Patient's Strengths and Goals (patient's own words):   Patient has supportive family. 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   Clinical Social Worker's Interpretive Summary:   CSW received referral in order to complete psychosocial assessment. CSW reviewed chart, spoke with bedside RN, and patient's parents. Bedside RN reports concern about patient reporting she wants to  leave AMA. CSW completed IVC paperwork and MD signed forms which were faxed to the [place]. CSW confirmed with Magistrate that forms were received and they reported that [employer] would come and serve patient. CSW informed bedside RN that signed IVC paperwork is valid for 24 hours until [employer] is able to serve.    CSW met with patient at bedside. CSW introduced myself and explained role. Patient minimally engaged and has flat affect. Patient reports she is not sure why psych MD was consulted because she is doing "just fine" and has no needs. Patient reports she lives at home with family and recently got hired as a Educational psychologist. Patient reports that she is excited to be working and upset that she is in the hospital and unable to go to her first day of work. Patient denies any SI and denies that she was trying to harm herself when she took the Seroquel tablets. Patient admits that tablets were not hers but reports she wanted to get some rest. Parents have reported that patient got into an argument with family prior to overdosing.    Patient reports a history of ADHD but denies any depression or anxiety. Patient reports she is compliant with following up with outpatient therapy and medication management and is usually compliant with her Adderall. Patient reports she first started using heroin when she was 42 because friends were using and she wanted to try it. Patient reports she continues to use about 3 times a week but does not see any damage caused by using and does not want treatment for substance use. Patient reports her parents sent her to rehab on the [facility] but it was not helpful and she continued to use. Patient minimizes the effect that substances have had on her and denies any other substance use, despite positive screen.    Patient reports she has no needs and does not need to speak with psych MD. CSW explained that CSW would continue to follow throughout hospitalization in order to provide support. CSW  will continue to follow and will assist with inpatient placement once medically stable.   Disposition:  Recommend Psych CSW continuing to support while in hospital   [place], O'Neill [phone]   Addendum 1500 [employer] served patient. IVC forms placed on chart and bedside RN aware. IVC paperwork valid until 06/01/14.

## 2014-05-25 NOTE — [facility] Notes (Signed)
PULMONARY / CRITICAL CARE MEDICINE   Name: Misty Ross MRN: [id number] DOB: May 10, 1991    ADMISSION DATE:  05/22/2014 CONSULTATION DATE:  05/23/2014  REFERRING MD :  Toniann Fail TRH  CHIEF COMPLAINT:  Agitation, suicide attempt  INITIAL PRESENTATION: 25 y/o female with a history of polysubstance abuse was admitted on 1/2 after taking presumably 20 tablets of seroquel.  UDS found to be positive for multiple illicit drugs.  She was admitted to the ICU and PCCM was consulted for worsening encephalopathy and agitation.  STUDIES:    SIGNIFICANT EVENTS: 1/1 admission 1/2 intubated 1/3 extubated 1/4 started on precedex gtt. Very agitated.   SUBJECTIVE:  slow to arouse. Sedated w/ ativan  precedex currently at 0.2  VITAL SIGNS: Temp:  [97.1 F (36.2 C)-102.9 F (39.4 C)] 97.2 F (36.2 C) (01/04 0700) Pulse Rate:  [72-148] 74 (01/04 0700) Resp:  [9-27] 19 (01/04 0700) BP: (112-139)/(59-101) 126/84 mmHg (01/04 0700) SpO2:  [61 %-98 %] 96 % (01/04 0700) FiO2 (%):  [40 %] 40 % (01/03 0930) HEMODYNAMICS:   INTAKE / OUTPUT:  Intake/Output Summary (Last 24 hours) at 05/25/14 0854 Last data filed at 05/25/14 0758  Gross per 24 hour  Intake 1076.59 ml  Output   2515 ml  Net -1438.41 ml    PHYSICAL EXAMINATION: General:  Sleepy but arouses to voice HEENT: NCAT, PERRL PULM: CTA B CV: RRR, no mgr AB: BS+, soft, nontender Ext: warm, no edema Derm: dry, no rash Neuro: arouses to voice, follows commands  LABS:  CBC  Recent Labs Lab 05/23/14 0028 05/24/14 0403  WBC 6.1 12.4*  HGB 12.9 10.0*  HCT 39.4 30.3*  PLT 237 206   Coag's No results for input(s): APTT, INR in the last 168 hours. BMET  Recent Labs Lab 05/23/14 0028 05/23/14 0723 05/24/14 0403  NA 135 141 135  K 3.8 3.4* 3.7  CL 100 103 108  CO2 BUN CREATININE 0.65 0.73 0.65  GLUCOSE 95 205* 116*   Electrolytes  Recent Labs Lab 05/23/14 0028 05/23/14 0723 05/24/14 0403   CALCIUM 9.0 9.0 8.0*   Sepsis Markers  Recent Labs Lab 05/23/14 0723  LATICACIDVEN 2.1   ABG  Recent Labs Lab 05/23/14 0440 05/23/14 0957  PHART 7.471* 7.410  PCO2ART 34.0* 38.7  PO2ART 105.0* 79.4*   Liver Enzymes  Recent Labs Lab 05/23/14 0028 05/23/14 0723  AST 32 31  ALT 24 26  ALKPHOS 60 65  BILITOT 1.0 0.8  ALBUMIN 4.1 4.3   Cardiac Enzymes No results for input(s): TROPONINI, PROBNP in the last 168 hours. Glucose  Recent Labs Lab 05/24/14 1221 05/24/14 1556 05/24/14 2009 05/25/14 0008 05/25/14 0354 05/25/14 0741  GLUCAP 109* 89 90 97 111* 94    Imaging 1/2 CXR images reviewed> ETT in place, ? Hazy RLL infiltrate EKG: Sinus tach, QTc 437, no ST wave change  ASSESSMENT / PLAN:  Toxicology: A: Cocaine, heroin, seroquel abuse/overdose > clinically improved without evidence of end organ damage P: Continue supportive care  Psyche: A: Suicide attempt Anxiety/Depression ADHD P: Psychiatry consult today Ativan prn anxiety  PULMONARY OETT 1/2 >> A: Acute respiratory failure due to drug overdose > resolved Aspiration pneumonitis vs pneumonia >aeration improved.  P:   Wean FIO2 See ID OK to advance diet in 4 hours Cont aspiration precautions   CARDIOVASCULAR CVL n/a A: Sinus tachycardia> resolved P:  KVO fluids Ck QTC   RENAL A:  No acute issues P:  Monitor BMET and UOP Replace electrolytes as needed  GASTROINTESTINAL A:  No acute issues P:   OK to advance diet after extubation  HEMATOLOGIC A:  No acute issues P:  Monitor for bleeding  INFECTIOUS A:  Aspiration pneumonitis P:   1/2 blood cx > 1/3 resp cx : mod GPC pairs, few GNC>>> 1/2 urine cx >  Ceftriaxone 1/2 > will start given ongoing fever, consider d/c 1/4 if CXR clear and no cough  ENDOCRINE A:  No acute issues   P:   Monitor blood glucose  NEUROLOGIC A:  Acute encephalopathy due to drug overdose (agitated delirium requiring sedation)  Opiate  dependence Cocaine use History of EtOH use Severe agitation last night requiring escalation of sedating meds and Precedex infusion.  P:   Escalate her Precedex ceiling to 2 Cont thiamine and folate Change Clonidine to 0.2mg  q8 Decrease benzo dosing (resulted in excess somnolence)   FAMILY  - Updates: Father updated at bedside by me 1/3 AM   TODAY'S NP SUMMARY: 24 y/o female with what appears to be an intentional overdose of seroquel in the setting of polysubstance abuse with heroin and cocaine and alcohol.   Extubated  1/3.  psych consulted and recommend in-pt when stable. She was very agitated last night 1/3-4. This required Precedex infusion and then addition of ativan. She is more sedated than I would prefer after the ativan. Will escalate the precedex ceiling to 2, increase clonidine, cont thiamine and folate, but decrease the ativan dosing. She is not ready medically for d/c as of yet from the ICU.   Simonne Martinet ACNP-BC [facility] Pulmonary/Critical Care Pager # [phone] OR # [phone] if no answer   ATTENDING MD FINDINGS AND SUMMARY  I have examined patient, reviewed labs, studies and notes. I have discussed the case with Kreg Shropshire and I agree with the data and plans as amended above. Encephalopathy due to polypharmacy OD. Now arousable post-extubation. Her precedex is at 0.2 (lowest dose) currently, likely due to effects of ativan. Will attempt to balance her sedating meds vs agitation. Priority to mobilize. She will need inpt psych stay once stable. Independent CC time 35 min.   Levy Pupa, MD, PhD 05/25/2014, 10:25 AM [facility] [phone] or if no answer [phone]

## 2014-05-26 ENCOUNTER — Inpatient Hospital Stay: Payer: Self-pay | Admitting: Psychiatry

## 2014-05-26 LAB — GLUCOSE, CAPILLARY
GLUCOSE-CAPILLARY: 100 mg/dL — AB (ref 70–99)
Glucose-Capillary: 162 mg/dL — ABNORMAL HIGH (ref 70–99)
Glucose-Capillary: 104 mg/dL — ABNORMAL HIGH (ref 70–99)

## 2014-05-26 LAB — CULTURE, RESPIRATORY

## 2014-05-26 LAB — CULTURE, RESPIRATORY W GRAM STAIN: Special Requests: NORMAL

## 2014-05-26 MED ORDER — CLONIDINE HCL 0.1 MG PO TABS
0.2000 mg | ORAL_TABLET | Freq: Three times a day (TID) | ORAL | Status: DC
Start: 2014-05-26 — End: 2014-05-26
  Administered 2014-05-26 (×2): 0.2 mg via ORAL
  Filled 2014-05-26 (×2): qty 2

## 2014-05-26 MED ORDER — FOLIC ACID 1 MG PO TABS: 1.0000 mg | ORAL_TABLET | Freq: Every day | ORAL | Status: DC

## 2014-05-26 MED ORDER — VITAMIN B-1 100 MG PO TABS
100.0000 mg | ORAL_TABLET | Freq: Every day | ORAL | Status: DC
  Administered 2014-05-26: 100 mg via ORAL
  Filled 2014-05-26: qty 1

## 2014-05-26 MED ORDER — THIAMINE HCL 100 MG PO TABS: 100.0000 mg | ORAL_TABLET | Freq: Every day | ORAL | Status: DC

## 2014-05-26 MED ORDER — CLONIDINE HCL 0.2 MG PO TABS: 0.2000 mg | ORAL_TABLET | Freq: Three times a day (TID) | ORAL | Status: DC

## 2014-05-26 MED ORDER — FOLIC ACID 1 MG PO TABS
1.0000 mg | ORAL_TABLET | Freq: Every day | ORAL | Status: DC
  Administered 2014-05-26: 1 mg via ORAL
  Filled 2014-05-26: qty 1

## 2014-05-26 MED ORDER — AMOXICILLIN-POT CLAVULANATE 875-125 MG PO TABS
1.0000 | ORAL_TABLET | Freq: Two times a day (BID) | ORAL | Status: DC
  Administered 2014-05-26: 1 via ORAL
  Filled 2014-05-26: qty 1

## 2014-05-26 MED ORDER — INFLUENZA VAC SPLIT QUAD 0.5 ML IM SUSY
0.5000 mL | PREFILLED_SYRINGE | Freq: Once | INTRAMUSCULAR | Status: DC
  Filled 2014-05-26: qty 0.5

## 2014-05-26 MED ORDER — VITAMINS A & D EX OINT
TOPICAL_OINTMENT | CUTANEOUS | Status: AC
  Filled 2014-05-26: qty 5

## 2014-05-26 MED ORDER — AMOXICILLIN-POT CLAVULANATE 875-125 MG PO TABS: 1.0000 | ORAL_TABLET | Freq: Two times a day (BID) | ORAL | Status: AC

## 2014-05-26 MED ORDER — NICOTINE 14 MG/24HR TD PT24: 14.0000 mg | MEDICATED_PATCH | Freq: Every day | TRANSDERMAL | Status: DC

## 2014-05-26 NOTE — [facility] Notes (Signed)
Clinical Social Work  CSW received a call from NP reporting that patient is medically stable to DC today. CSW contacted the following facilities re: bed availability:  [facility]- unsure of bed availability. Referral faxed.  [facility]- AC Inetta Fermo(Tina) aware of patient and will call if bed becomes available  [facility]- no available beds  [facility]- unsure of bed availability. Referral faxed.  [facility]- [facility] beds. Referral faxed.  CSW will continue to follow and will assist with DC planning.  JuniataHolly Laurenashley Viar, Kentucky[place]CSW [phone]

## 2014-05-26 NOTE — [facility] Notes (Signed)
PULMONARY / CRITICAL CARE MEDICINE   Name: Georgiann HahnJanine Ganser MRN: [id number] DOB: [birth year]    ADMISSION DATE:  05/22/2014 CONSULTATION DATE:  05/23/2014  REFERRING MD :  Toniann FailKakrakandy TRH  CHIEF COMPLAINT:  Agitation, suicide attempt  INITIAL PRESENTATION: 24 y/o female with a history of polysubstance abuse was admitted on 1/2 after taking presumably 20 tablets of seroquel.  UDS found to be positive for multiple illicit drugs.  She was admitted to the ICU and PCCM was consulted for worsening encephalopathy and agitation.  STUDIES:    SIGNIFICANT EVENTS: 1/1 admission 1/2 intubated 1/3 extubated 1/4 started on precedex gtt. Very agitated.  1/4. Per nursing staff,  Pt trying to get contraband drugs smuggled into hospital room. precedex d/c'd later in the evening on 1/4. IVC papers served  1/5 cleared medically for d/c to in-pt setting   SUBJECTIVE:  Awake and appropriate   VITAL SIGNS: Temp:  [97.2 F (36.2 C)-100 F (37.8 C)] 100 F (37.8 C) (01/04 2000) Pulse Rate:  [74-124] 97 (01/05 0700) Resp:  [16-25] 16 (01/05 0700) BP: (95-147)/(42-110) 112/70 mmHg (01/05 0600) SpO2:  [86 %-100 %] 96 % (01/05 0700) HEMODYNAMICS:   INTAKE / OUTPUT:  Intake/Output Summary (Last 24 hours) at 05/26/14 0842 Last data filed at 05/25/14 2215  Gross per 24 hour  Intake    690 ml  Output   1125 ml  Net   -435 ml    PHYSICAL EXAMINATION: General:  Awake, cooperative. No c/o.  HENT: NCAT PERRL PULM: CTA B CV: RRR, no mgr AB: BS+, soft, nontender Ext: warm, no edema Derm: dry, no rash Neuro: arouses to voice, follows commands  LABS:  CBC  Recent Labs Lab 05/23/14 0028 05/24/14 0403  WBC 6.1 12.4*  HGB 12.9 10.0*  HCT 39.4 30.3*  PLT 237 206   Coag's No results for input(s): APTT, INR in the last 168 hours. BMET  Recent Labs Lab 05/23/14 0028 05/23/14 0723 05/24/14 0403  NA 135 141 135  K 3.8 3.4* 3.7  CL 100 103 108  CO2 24 26 23   BUN 15 12 11   CREATININE 0.65 0.73  0.65  GLUCOSE 95 205* 116*   Electrolytes  Recent Labs Lab 05/23/14 0028 05/23/14 0723 05/24/14 0403  CALCIUM 9.0 9.0 8.0*   Sepsis Markers  Recent Labs Lab 05/23/14 0723  LATICACIDVEN 2.1   ABG  Recent Labs Lab 05/23/14 0440 05/23/14 0957  PHART 7.471* 7.410  PCO2ART 34.0* 38.7  PO2ART 105.0* 79.4*   Liver Enzymes  Recent Labs Lab 05/23/14 0028 05/23/14 0723  AST 32 31  ALT 24 26  ALKPHOS 60 65  BILITOT 1.0 0.8  ALBUMIN 4.1 4.3   Cardiac Enzymes No results for input(s): TROPONINI, PROBNP in the last 168 hours. Glucose  Recent Labs Lab 05/25/14 0354 05/25/14 0741 05/25/14 1206 05/25/14 1804 05/26/14 0016 05/26/14 0816  GLUCAP 111* 94 94 76 100* 162*   IMPRESSION/PLAN  Acute encephalopathy due to Cocaine, heroin, seroquel abuse/overdose (agitated delirium requiring sedation) -->resolved Opiate dependence-->likely low grade opiate w/d  Cocaine use History of EtOH use Anxiety/Depression ADHD Severe agitation 1/4 requiring escalation of sedating meds and Precedex infusion. Now awake, appropriate and oriented. Does not currently verbalize desire to harm self. Off precedex as of 1/4 PLAN:   Increase Clonidine  Cont thiamine and folate Needs In-patient psych for detox and chemical abuse support  Aspiration pneumonitis PLAN:   1/2 blood cx > 1/3 resp cx : mod GPC pairs, few GNC>>> 1/2 urine cx >  neg Rocephin 1/2>>>1/5, change to Augmentin 1/5 (complete 7d total)      FAMILY  - Updates: Father updated at bedside by me 1/3 AM   TODAY'S NP SUMMARY: 24 y/o female with what appears to be an intentional overdose of seroquel in the setting of polysubstance abuse with heroin and cocaine and alcohol.   Extubated  1/3.  psych consulted and recommend in-pt when stable & she has now been IVC'd. She is medically clear for d/c to in-pt setting. Will transition her to augmentin to complete 7d total and increase her Clonidine given her report of narc w/d  sxs.    Simonne Martinet ACNP-BC [facility] Pulmonary/Critical Care Pager # [phone] OR # [phone] if no answer  Attending Note:  I have examined patient, reviewed labs, studies and notes. I have discussed the case with Kreg Shropshire and I agree with the data and plans as amended above.   Levy Pupa, MD, PhD 05/26/2014, 1:32 PM [facility] [phone] or if no answer [phone]

## 2014-05-26 NOTE — Discharge Summary (Signed)
Physician Discharge Summary       Patient ID: Misty HahnJanine Fenderson MRN: [id number] DOB/AGE: [birth year] 24 y.o.  Admit date: 05/22/2014 Discharge date: 05/26/2014  Discharge Diagnoses:  Acute encephalopathy due to Cocaine, heroin, seroquel abuse/overdose  Opiate dependence  History of ETOH Anxiety and depression  ADHD Aspiration pneumonitis  Detailed Hospital Course:  This is a 24 y.o. female with a history of polysubstance abuse and ADHD was brought into the ER on 1/2. The patient stated that that she had consumed 20 pills of Seroquel dose of which was not clear. As per patient's dad patient has been abusing street drugs including IV drugs. The night prior to admission, the patient was very agitated and was stating that she was not getting high and later took 20 pills of Seroquel. Patient's dad is not sure from where the patient got the Seroquel as it was not prescribed for her. In addition a urine drug screen shows positive for cocaine opiates benzos and marijuana. On admit was very agitated tachycardic and hypertensive but not febrile. She was given Ativan and consulted pulmonary critical care Dr. Tyson AliasFeinstein who at this time is ordering Precedex and Versed. Patient was admitted for further management of her acute agitational status from drug overdose. Salicylates and acetaminophen levels were negative. She was eventually intubated on 1/2 due to the amount of sedating medications required to treat her agitation. She passed her spontaneous breathing trial on 1/3 and was extubated. Prior to that on 1/2 we did start empiric antibiotics for fever and concern about possible aspiration. Clinically she was very agitated the PM of 1/3. Prior to this she had been weaned of precedex. We had to resume the precedex infusion for another 12 hours. The pm hours of 1/4 her precedex was weaned to off. She was awake and alert. More appropriate. Of note floor nursing administration had to intervene when the patient was caught  trying to contact someone from the outside to sneak in contraband drugs to her hospital room. She was seen by psychiatry who felt she would benefit from in-patient stay. She had wanted to leave AMA and because of this IVC papers were signed. She was felt stable for d/c to in-pt psych as of 1/5 with the plan as outlined below.    Discharge Plan by active problems  Intentional overdose (seroquel, heroin, cocaine)  Substance Dependence  ADDH plan Now medically stable. Seen by psych. Who recommends in-patient admission.  Will send her out w/ Clonidine, folic acid, thiamine and her home adderall.  She will likely need further medical management for her substance dependence   Aspiration Pneumonitis. CXR clear Plan Complete 4d of augmentin  Significant Hospital tests/ studies  Consults: psychiatry   Discharge Exam: BP 124/83 mmHg  Pulse 85  Temp(Src) 98.2 F (36.8 C) (Oral)  Resp 22  Ht 5\' 3"  (1.6 m)  Wt 48.9 kg (107 lb 12.9 oz)  BMI 19.10 kg/m2  SpO2 97%  LMP   General: Awake, cooperative. No c/o.  HENT: NCAT PERRL PULM: CTA B CV: RRR, no mgr AB: BS+, soft, nontender Ext: warm, no edema Derm: dry, no rash Neuro: arouses to voice, follows commands  Labs at discharge Lab Results  Component Value Date   CREATININE 0.65 05/24/2014   BUN 11 05/24/2014   NA 135 05/24/2014   K 3.7 05/24/2014   CL 108 05/24/2014   CO2 23 05/24/2014   Lab Results  Component Value Date   WBC 12.4* 05/24/2014   HGB 10.0* 05/24/2014  HCT 30.3* 05/24/2014   MCV 85.8 05/24/2014   PLT 206 05/24/2014   Lab Results  Component Value Date   ALT 26 05/23/2014   AST 31 05/23/2014   ALKPHOS 65 05/23/2014   BILITOT 0.8 05/23/2014   No results found for: INR, PROTIME  Current radiology studies Dg Chest Port 1 View  05/25/2014   CLINICAL DATA:  Pneumonia  EXAM: PORTABLE CHEST - 1 VIEW  COMPARISON:  Portable chest x-ray of May 23, 2014  FINDINGS: The patient has undergone interval extubation  of the trachea and of the esophagus. The lungs are adequately inflated. There is minimal persistent density at the left lung base. There is no pleural effusion or pneumothorax. The heart and pulmonary vascularity are normal. The gas pattern in the upper abdomen is unremarkable. The bony thorax is also unremarkable.  IMPRESSION: Improving left lower lobe pneumonia.   Electronically Signed   By: David  [place]   On: 05/25/2014 07:48    Disposition:  01-Home or Self Care      Discharge Instructions    Diet - low sodium heart healthy    Complete by:  As directed      Increase activity slowly    Complete by:  As directed             Medication List    STOP taking these medications        amoxicillin 500 MG capsule  Commonly known as:  AMOXIL     lisdexamfetamine 40 MG capsule  Commonly known as:  VYVANSE     mirtazapine 15 MG tablet  Commonly known as:  REMERON      TAKE these medications        albuterol 108 (90 BASE) MCG/ACT inhaler  Commonly known as:  PROVENTIL HFA;VENTOLIN HFA  Inhale 2 puffs into the lungs 4 (four) times daily.     amoxicillin-clavulanate 875-125 MG per tablet  Commonly known as:  AUGMENTIN  Take 1 tablet by mouth every 12 (twelve) hours.     amphetamine-dextroamphetamine 20 MG tablet  Commonly known as:  ADDERALL  Take 20 mg by mouth every evening.     cloNIDine 0.2 MG tablet  Commonly known as:  CATAPRES  Take 1 tablet (0.2 mg total) by mouth 3 (three) times daily.     folic acid 1 MG tablet  Commonly known as:  FOLVITE  Take 1 tablet (1 mg total) by mouth daily.     gabapentin 300 MG capsule  Commonly known as:  NEURONTIN  Take 300-600 mg by mouth 3 (three) times daily. One capsule by mouth in the morning, one capsule in the afternoon and two capsules at bedtime.     ibuprofen 200 MG tablet  Commonly known as:  ADVIL,MOTRIN  Take 200-800 mg by mouth every 6 (six) hours as needed for headache.     nicotine 14 mg/24hr patch  Commonly  known as:  NICODERM CQ - dosed in mg/24 hours  Place 1 patch (14 mg total) onto the skin daily.     thiamine 100 MG tablet  Take 1 tablet (100 mg total) by mouth daily.         Discharged Condition: good  Physician Statement:   The Patient was personally examined, the discharge assessment and plan has been personally reviewed and I agree with ACNP Babcock's assessment and plan. > 30 minutes of time have been dedicated to discharge assessment, planning and discharge instructions.   Signed: BABCOCK,PETE 05/26/2014, 3:46 PM  Levy Pupa, MD, PhD 05/27/2014, 9:23 AM  Pulmonary and Critical Care [phone] or if no answer [phone]

## 2014-05-26 NOTE — [facility] Notes (Signed)
Clinical Social Work  Patient accepted to [facility] by Dr. Bary Leriche and they are ready to accept.  CSW made NP aware who will place DC orders.CSW met with patient and mother at bedside and explained acceptance to [place] explained that [facility] would need to transport patient due to IVC status. RN aware of DC plans and will call report to [phone]. CSW arranged transportation via [facility] who will provide transportation to [place] is signing off but available if needed.  [place], [place] [phone]

## 2014-05-26 NOTE — Progress Notes (Signed)
NUTRITION FOLLOW UP  Intervention:   -Continue with general healthy diet -Continue with vitamin and mineral supplementation -RD to continue to montior  Nutrition Dx:   Inadequate oral intake related to inability to eat as evidenced by NPO status; improving with diet advancement  Goal:   Pt to meet >/= 90% of their estimated nutrition needs; progressing  Monitor:   Total protein/energy intake, labs, weights  Assessment:   1/02: Patient is currently intubated on ventilator support d/t acute respiratory failure d/t drug overdose MV: 6.1 L/min Temp (24hrs), Avg:97.5 F (36.4 C), Min:97 F (36.1 C), Max:98.1 F (36.7 C)  Propofol: none at this time  Suspect poor quality diet d/t history of drug and ETOH abuse.  Nutrition focused physical exam shows no sign of depletion of muscle mass or body fat.  1/05: -Pt extubated on 1/03. OGT removed -Diet advanced to Regular on 1/04 -PO intake excellent, 90-100%; no need for nutrition supplement at this time as pt likely meeting estimated nutrition needs with current PO intake; consider if PO intake < 75% -Continue with current vitamin and mineral supplementation  Height: Ht Readings from Last 1 Encounters:  05/23/14 5\' 3"  (1.6 m)    Weight Status:   Wt Readings from Last 1 Encounters:  05/23/14 107 lb 12.9 oz (48.9 kg)    Re-estimated needs:  Kcal: 1300-1500 Protein: 65-75 gram Fluid: >/= 1500 ml daily  Skin: WDL  Diet Order: Diet regular   Intake/Output Summary (Last 24 hours) at 05/26/14 1358 Last data filed at 05/26/14 0945  Gross per 24 hour  Intake   1070 ml  Output    800 ml  Net    270 ml    Last BM: 1/04   Labs:   Recent Labs Lab 05/23/14 0028 05/23/14 0723 05/24/14 0403  NA 135 141 135  K 3.8 3.4* 3.7  CL 100 103 108  CO2 24 26 23   BUN 15 12 11   CREATININE 0.65 0.73 0.65  CALCIUM 9.0 9.0 8.0*  GLUCOSE 95 205* 116*    CBG (last 3)   Recent Labs  05/26/14 0016 05/26/14 0816  05/26/14 1224  GLUCAP 100* 162* 104*    Scheduled Meds: . amoxicillin-clavulanate  1 tablet Oral Q12H  . amphetamine-dextroamphetamine  20 mg Oral QPM  . cloNIDine  0.2 mg Oral TID  . folic acid  1 mg Oral Daily  . Influenza vac split quadrivalent PF  0.5 mL Intramuscular Tomorrow-1000  . nicotine  14 mg Transdermal Daily  . thiamine  100 mg Oral Daily  . vitamin A & D        Continuous Infusions:   Misty HugerSarah F Mikaiah Stoffer MS RD LDN Clinical Dietitian [phone]

## 2014-05-30 LAB — CULTURE, BLOOD (ROUTINE X 2)
Culture: NO GROWTH
Culture: NO GROWTH

## 2014-07-15 ENCOUNTER — Emergency Department (HOSPITAL_COMMUNITY)
Admission: EM | Admit: 2014-07-15 | Discharge: 2014-07-17 | Disposition: A | Discharge status: Other Acute Inpatient Hospital | Payer: BC Managed Care – PPO | Attending: Emergency Medicine | Admitting: Emergency Medicine

## 2014-07-15 ENCOUNTER — Encounter (HOSPITAL_COMMUNITY): Payer: Self-pay

## 2014-07-15 DIAGNOSIS — F1910 Other psychoactive substance abuse, uncomplicated: Secondary | ICD-10-CM

## 2014-07-15 DIAGNOSIS — F332 Major depressive disorder, recurrent severe without psychotic features: Secondary | ICD-10-CM

## 2014-07-15 DIAGNOSIS — R45851 Suicidal ideations: Secondary | ICD-10-CM | POA: Insufficient documentation

## 2014-07-15 DIAGNOSIS — F329 Major depressive disorder, single episode, unspecified: Secondary | ICD-10-CM | POA: Diagnosis present

## 2014-07-15 DIAGNOSIS — F339 Major depressive disorder, recurrent, unspecified: Secondary | ICD-10-CM | POA: Diagnosis present

## 2014-07-15 DIAGNOSIS — Z79899 Other long term (current) drug therapy: Secondary | ICD-10-CM | POA: Diagnosis not present

## 2014-07-15 DIAGNOSIS — Z720 Tobacco use: Secondary | ICD-10-CM | POA: Insufficient documentation

## 2014-07-15 DIAGNOSIS — F322 Major depressive disorder, single episode, severe without psychotic features: Secondary | ICD-10-CM | POA: Diagnosis not present

## 2014-07-15 DIAGNOSIS — F1920 Other psychoactive substance dependence, uncomplicated: Secondary | ICD-10-CM | POA: Diagnosis present

## 2014-07-15 LAB — COMPREHENSIVE METABOLIC PANEL
ALBUMIN: 4.5 g/dL (ref 3.5–5.2)
ALK PHOS: 61 U/L (ref 39–117)
ALT: 16 U/L (ref 0–35)
AST: 20 U/L (ref 0–37)
Anion gap: 12 (ref 5–15)
BUN: 15 mg/dL (ref 6–23)
CALCIUM: 9.9 mg/dL (ref 8.4–10.5)
CO2: 25 mmol/L (ref 19–32)
Chloride: 106 mmol/L (ref 96–112)
Creatinine, Ser: 0.6 mg/dL (ref 0.50–1.10)
GFR calc Af Amer: >90 mL/min (ref >90)
GFR calc non Af Amer: >90 mL/min (ref >90)
Glucose, Bld: 90 mg/dL (ref 70–99)
POTASSIUM: 3.8 mmol/L (ref 3.5–5.1)
SODIUM: 143 mmol/L (ref 135–145)
TOTAL PROTEIN: 7.5 g/dL (ref 6.0–8.3)
Total Bilirubin: 0.6 mg/dL (ref 0.3–1.2)

## 2014-07-15 LAB — RAPID URINE DRUG SCREEN, HOSP PERFORMED
Amphetamines: NOT DETECTED
Barbiturates: NOT DETECTED
Benzodiazepines: POSITIVE — AB
Cocaine: POSITIVE — AB
OPIATES: POSITIVE — AB
TETRAHYDROCANNABINOL: POSITIVE — AB

## 2014-07-15 LAB — CBC
HCT: 40 % (ref 36.0–46.0)
Hemoglobin: 13.3 g/dL (ref 12.0–15.0)
MCH: 27.8 pg (ref 26.0–34.0)
MCHC: 33.3 g/dL (ref 30.0–36.0)
MCV: 83.7 fL (ref 78.0–100.0)
PLATELETS: 301 10*3/uL (ref 150–400)
RBC: 4.78 MIL/uL (ref 3.87–5.11)
RDW: 13.3 % (ref 11.5–15.5)
WBC: 6 10*3/uL (ref 4.0–10.5)

## 2014-07-15 LAB — ACETAMINOPHEN LEVEL: Acetaminophen (Tylenol), Serum: <10 ug/mL — ABNORMAL LOW (ref 10–30)

## 2014-07-15 LAB — SALICYLATE LEVEL: Salicylate Lvl: <4 mg/dL (ref 2.8–20.0)

## 2014-07-15 LAB — ETHANOL: Alcohol, Ethyl (B): <5 mg/dL (ref 0–9)

## 2014-07-15 MED ORDER — ACETAMINOPHEN 325 MG PO TABS
650.0000 mg | ORAL_TABLET | ORAL | Status: DC | PRN
  Administered 2014-07-16 (×2): 650 mg via ORAL
  Filled 2014-07-15 (×2): qty 2

## 2014-07-15 MED ORDER — ONDANSETRON HCL 4 MG PO TABS: 4.0000 mg | ORAL_TABLET | Freq: Three times a day (TID) | ORAL | Status: DC | PRN

## 2014-07-15 MED ORDER — IBUPROFEN 200 MG PO TABS: 600.0000 mg | ORAL_TABLET | Freq: Three times a day (TID) | ORAL | Status: DC | PRN

## 2014-07-15 NOTE — ED Notes (Addendum)
Pt has been seen and wanded by security.   

## 2014-07-15 NOTE — ED Notes (Signed)
1x unsuccessful blood draw 

## 2014-07-15 NOTE — ED Notes (Signed)
Patient arrived to ED via [employer] with IVC paperwork.  Her father reports she has been suicidal and using heroin.
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
## 2014-07-16 DIAGNOSIS — F329 Major depressive disorder, single episode, unspecified: Secondary | ICD-10-CM | POA: Diagnosis present

## 2014-07-16 DIAGNOSIS — F322 Major depressive disorder, single episode, severe without psychotic features: Secondary | ICD-10-CM | POA: Diagnosis not present

## 2014-07-16 MED ORDER — CLONIDINE HCL 0.1 MG PO TABS: 0.1000 mg | ORAL_TABLET | ORAL | Status: DC

## 2014-07-16 MED ORDER — NICOTINE 14 MG/24HR TD PT24
14.0000 mg | MEDICATED_PATCH | Freq: Every day | TRANSDERMAL | Status: DC
  Filled 2014-07-16: qty 1

## 2014-07-16 MED ORDER — VITAMIN B-1 100 MG PO TABS
100.0000 mg | ORAL_TABLET | Freq: Every day | ORAL | Status: DC
  Administered 2014-07-16 – 2014-07-17 (×2): 100 mg via ORAL
  Filled 2014-07-16 (×2): qty 1

## 2014-07-16 MED ORDER — DICYCLOMINE HCL 20 MG PO TABS: 20.0000 mg | ORAL_TABLET | Freq: Four times a day (QID) | ORAL | Status: DC | PRN

## 2014-07-16 MED ORDER — CLONIDINE HCL 0.1 MG PO TABS
0.1000 mg | ORAL_TABLET | Freq: Every day | ORAL | Status: DC
Start: 2014-07-21 — End: 2014-07-17

## 2014-07-16 MED ORDER — METHOCARBAMOL 500 MG PO TABS
500.0000 mg | ORAL_TABLET | Freq: Three times a day (TID) | ORAL | Status: DC | PRN
  Administered 2014-07-16: 500 mg via ORAL
  Filled 2014-07-16: qty 1

## 2014-07-16 MED ORDER — ONDANSETRON 4 MG PO TBDP
4.0000 mg | ORAL_TABLET | Freq: Four times a day (QID) | ORAL | Status: DC | PRN
  Administered 2014-07-16: 4 mg via ORAL
  Filled 2014-07-16: qty 1

## 2014-07-16 MED ORDER — HYDROXYZINE HCL 25 MG PO TABS
25.0000 mg | ORAL_TABLET | Freq: Four times a day (QID) | ORAL | Status: DC | PRN
  Administered 2014-07-16 (×2): 25 mg via ORAL
  Filled 2014-07-16 (×2): qty 1

## 2014-07-16 MED ORDER — CLONIDINE HCL 0.1 MG PO TABS
0.1000 mg | ORAL_TABLET | Freq: Four times a day (QID) | ORAL | Status: DC
  Administered 2014-07-16 (×2): 0.1 mg via ORAL
  Filled 2014-07-16 (×2): qty 1

## 2014-07-16 MED ORDER — FOLIC ACID 1 MG PO TABS
1.0000 mg | ORAL_TABLET | Freq: Every day | ORAL | Status: DC
  Administered 2014-07-16 – 2014-07-17 (×2): 1 mg via ORAL
  Filled 2014-07-16 (×2): qty 1

## 2014-07-16 MED ORDER — ALBUTEROL SULFATE HFA 108 (90 BASE) MCG/ACT IN AERS: 2.0000 | INHALATION_SPRAY | RESPIRATORY_TRACT | Status: DC | PRN

## 2014-07-16 MED ORDER — LOPERAMIDE HCL 2 MG PO CAPS: 2.0000 mg | ORAL_CAPSULE | ORAL | Status: DC | PRN

## 2014-07-16 MED ORDER — NAPROXEN 500 MG PO TABS
500.0000 mg | ORAL_TABLET | Freq: Two times a day (BID) | ORAL | Status: DC | PRN
  Administered 2014-07-16: 500 mg via ORAL
  Filled 2014-07-16: qty 1

## 2014-07-16 MED ORDER — LORAZEPAM 1 MG PO TABS
1.0000 mg | ORAL_TABLET | Freq: Once | ORAL | Status: AC
  Administered 2014-07-16: 1 mg via ORAL
  Filled 2014-07-16: qty 1

## 2014-07-16 MED ORDER — HYDROXYZINE HCL 25 MG PO TABS: 25.0000 mg | ORAL_TABLET | Freq: Four times a day (QID) | ORAL | Status: DC | PRN

## 2014-07-16 NOTE — ED Notes (Signed)
Up on the phone 

## 2014-07-16 NOTE — ED Notes (Signed)
Dr A and Josephine NP into see 
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
## 2014-07-16 NOTE — ED Notes (Signed)
CSW in to talk w/ pt and dad

## 2014-07-16 NOTE — Consult Note (Signed)
[facility] Face-to-Face Psychiatry Consult   Reason for Consult:  Suicidal ideation, Depression, nos, Polysubstance abuse, severe use Referring Physician:  EDP Patient Identification: Misty Ross MRN:  [id number] Principal Diagnosis: Major depressive disorder Diagnosis:   Patient Active Problem List   Diagnosis Date Noted  . Major depressive disorder [F32.2] 07/16/2014    Priority: High  . Aspiration pneumonia [J69.0] 05/24/2014  . Acute encephalopathy [G93.40] 05/23/2014  . Suicidal ideation [R45.851] 05/23/2014  . Polysubstance abuse [F19.10] 05/23/2014  . Acute respiratory failure with hypoxemia [J96.01] 05/23/2014  . Suicide attempt [T14.91] 05/23/2014  . Drug overdose, intentional [T50.902A]   . Tobacco Use Disorder [Z72.0] 03/28/2007  . 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 Attention deficit hyperactivity disorder (ADHD) [F90.9] 03/28/2007    Total Time spent with patient: 45 minutes  Subjective:   Misty Ross is a 24 y.o. female patient admitted with Suicidal ideation with no plans, Polysubstance abuse.  HPI:  Caucasian female 24 years old was seen this morning who was IVC by her father after she called her and stated that she was going kill her self. Patient was breaking things at home and throwing things after an argument with her family.  Patient reported taking" any drugs that I can lay my hands on"  Patient came back from [place] last month after receiving treatment for substances.  She relapsed last month and stated she has been buying Xanax, Percocet, Cocaine and Marijuana off the street.   She denies SI/HI/AVH today but she attempted suicide last month by OD on Seroquel..   Patient reports that she takes Seroquel for sleep prescribed by her PMD but denied diagnosis of MH illness.  Patient reports feeling depressed but does not take any medications.   She has been accepted for admission and will be transferred to any facility with available  beds.  HPI Elements:   Location:  Major depression, Polysubstance abuse, suicidal ideation. Quality:  severe. Severity:  severe. Timing:  Acute. Duration:  Off and on for 2 years.. Context:  IVC by father for suicidal ideation..  Past Medical History:  Past Medical History  Diagnosis Date  . Allergy   . Anxiety   . Depression   . Seizures   . ADHD (attention deficit hyperactivity disorder)     Past Surgical History  Procedure Laterality Date  . Tonsillectomy and adenoidectomy     Family History:  Family History  Problem Relation Age of Onset  . Hyperlipidemia Father   . Cancer Maternal Grandmother   . Diabetes Maternal Grandmother   . Cancer Maternal Grandfather   . Heart disease Paternal Grandmother   . Cancer Paternal Grandfather   . Multiple sclerosis Brother    Social History:  History  Alcohol Use  . Yes    Comment: one beer per day     History  Drug Use  . Yes    Comment: heroine    History   Social History  . Marital Status: Single    Spouse Name: N/A  . Number of Children: N/A  . Years of Education: N/A   Social History Main Topics  . Smoking status: Current Every Day Smoker -- 0.50 packs/day    Types: Cigarettes  . Smokeless tobacco: Not on file  . Alcohol Use: Yes     Comment: one beer per day  . Drug Use: Yes     Comment: heroine  . Sexual Activity: Not on file   Other Topics Concern  . None  Social History Narrative   Additional Social History:    Pain Medications: SEE PTA, has been abusing heroin Prescriptions: SEE PTA Over the Counter: SEE PTA History of alcohol / drug use?: Yes (Pt has been abusing heroin for about a year. Reports she was placed on maintenance drug in January but this is not confirmed. Pt denies other drugs use but tested positive for cocaine, benzos, and THC) Longest period of sobriety (when/how long): unknown Negative Consequences of Use: Personal relationships Withdrawal Symptoms:  (reports she is feeling  uncomfortable but does not provide specifics) Name of Substance 1: heroin 1 - Age of First Use: about a year ago, dad reports about 18 months ago 1 - Amount (size/oz): "I don't know" 1 - Frequency: daily  1 - Duration: 1 year to 18 months 1 - Last Use / Amount: pt reports she has not used in 1-2 months, but tested positive, reports placed on maintenance drug but did report this to pharmacy tech  Name of Substance 2: THC 2 - Age of First Use: pt reports she does not really use THC but tested positive Name of Substance 3: Cocaine  3 - Age of First Use: 20 3 - Amount (size/oz): "I don't know" 3 - Frequency: reports she does not know how often she uses, denies using recently but tested positive  Name of Substance 4: Benzos, did not report use but tested positive              Allergies:  No Known Allergies  Vitals: Blood pressure 132/74, pulse 74, temperature 97.9 F (36.6 C), temperature source Oral, resp. rate 16, last menstrual period 07/01/2014, SpO2 100 %.  Risk to Self: Suicidal Ideation:  (unclear reports she made comments but denies intent ) Suicidal Intent: No Is patient at risk for suicide?: Yes Suicidal Plan?: No Access to Means: Yes Specify Access to Suicidal Means: overdose in January  What has been your use of drugs/alcohol within the last 12 months?: Pt has been abusing heroin, and also tested positive for benzos, cocaine, and THC How many times?: 1 (intentional overdose in Jan, denies it was suicide attempt) Other Self Harm Risks: uses drugs has overdosed on heroin and seroquel per EDP note Triggers for Past Attempts: Unknown Intentional Self Injurious Behavior: None Risk to Others: Homicidal Ideation: No Thoughts of Harm to Others: No Current Homicidal Intent: No Current Homicidal Plan: No Access to Homicidal Means: No Identified Victim: none History of harm to others?: No Assessment of Violence: On admission Violent Behavior Description: pt reports she broke  things today, dad reports she broke a door Does patient have access to weapons?: No Criminal Charges Pending?: No Does patient have a court date: No Prior Inpatient Therapy: Prior Inpatient Therapy: Yes Prior Therapy Dates: "Couple of months ago" [facility]  Reason for Treatment: SA Prior Outpatient Therapy: Prior Outpatient Therapy: Yes Prior Therapy Dates: current  Prior Therapy Facilty/Provider(s): Tresa End[facility] NP, pt does not know practice Reason for Treatment: SA  Current Facility-Administered Medications  Medication Dose Route Frequency Provider Last Rate Last Dose  . acetaminophen (TYLENOL) tablet 650 mg  650 mg Oral Q4H PRN Emilia BeckKaitlyn Szekalski, PA-C   650 mg at 07/16/14 0151  . albuterol (PROVENTIL HFA;VENTOLIN HFA) 108 (90 BASE) MCG/ACT inhaler 2 puff  2 puff Inhalation Q4H PRN Kristen N Ward, DO      . folic acid (FOLVITE) tablet 1 mg  1 mg Oral Daily Kristen N Ward, DO      .  ibuprofen (ADVIL,MOTRIN) tablet 600 mg  600 mg Oral Q8H PRN Kaitlyn Szekalski, PA-C      . nicotine (NICODERM CQ - dosed in mg/24 hours) patch 14 mg  14 mg Transdermal Daily Kristen N Ward, DO      . ondansetron (ZOFRAN) tablet 4 mg  4 mg Oral Q8H PRN Kaitlyn Szekalski, PA-C      . thiamine (VITAMIN B-1) tablet 100 mg  100 mg Oral Daily Kristen N Ward, DO       Current Outpatient Prescriptions  Medication Sig Dispense Refill  . albuterol (PROVENTIL HFA;VENTOLIN HFA) 108 (90 BASE) MCG/ACT inhaler Inhale 2 puffs into the lungs 4 (four) times daily.     . cloNIDine (CATAPRES) 0.2 MG tablet Take 1 tablet (0.2 mg total) by mouth 3 (three) times daily. 60 tablet 11  . ibuprofen (ADVIL,MOTRIN) 200 MG tablet Take 200-800 mg by mouth every 6 (six) hours as needed for headache.     . folic acid (FOLVITE) 1 MG tablet Take 1 tablet (1 mg total) by mouth daily. (Patient not taking: Reported on 07/15/2014)    . nicotine (NICODERM CQ - DOSED IN MG/24 HOURS) 14 mg/24hr patch Place 1 patch  (14 mg total) onto the skin daily. (Patient not taking: Reported on 07/15/2014) 28 patch 0  . thiamine 100 MG tablet Take 1 tablet (100 mg total) by mouth daily. (Patient not taking: Reported on 07/15/2014)      Musculoskeletal: Strength & Muscle Tone: within normal limits Gait & Station: normal Patient leans: N/A  Psychiatric Specialty Exam:     Blood pressure 132/74, pulse 74, temperature 97.9 F (36.6 C), temperature source Oral, resp. rate 16, last menstrual period 07/01/2014, SpO2 100 %.There is no weight on file to calculate BMI.  General Appearance: Casual and Disheveled  Eye Contact::  Minimal  Speech:  Clear and Coherent and Normal Rate  Volume:  Normal  Mood:  Angry, Anxious and Depressed  Affect:  Congruent, Depressed and Flat  Thought Process:  Coherent, Goal Directed and Intact  Orientation:  Full (Time, Place, and Person)  Thought Content:  WDL  Suicidal Thoughts:  No  Homicidal Thoughts:  No  Memory:  Immediate;   Good Recent;   Good Remote;   Good  Judgement:  Impaired  Insight:  Shallow  Psychomotor Activity:  Normal  Concentration:  Fair  Recall:  NA  Fund of Knowledge:Fair  Language: Good  Akathisia:  NA  Handed:  Right  AIMS (if indicated):     Assets:  Desire for Improvement  ADL's:  Impaired  Cognition: WNL  Sleep:      Medical Decision Making: Established Problem, Worsening (2)  Treatment Plan Summary: Daily contact with patient to assess and evaluate symptoms and progress in treatment, Medication management and Plan Admit to any hospital with available bed  Plan:  Recommend psychiatric Inpatient admission when medically cleared. Admit to anyhospital with available bed. Disposition: [facility]bye [place] 07/16/2014 12:17 PM Patient seen face-to-face for psychiatric evaluation, chart reviewed and case discussed with the physician extender and developed treatment plan. Reviewed the information documented and agree with the treatment  plan. Thedore Mins, MD

## 2014-07-16 NOTE — ED Notes (Signed)
Pt's mom is requesting to talk w/ someone tomorrow--Elaine Guster-contact # [phone].  Mom reports that the patients OD last month required her to be placed on a ventilator x3 days.

## 2014-07-16 NOTE — Progress Notes (Signed)
CSW spoke with pt about disposition. At this time pt remains under IVC, and pt recommended for inpatient treatment. Pt told CSW that patient dad is wanting to take patient home today and [place]. Pt signed consent to release of information for patient father Nedra HaiBrier Lowy. CSW spoke with pt father at [phone]. Pt father states that he is not necessarily wanting to take patient back to rehab, but wants patient to get help. Pt [facility] CSW explained that patient is recommended for inpatient treatment due to her previous si attempts and Si statements since being out of rehab which brought patient to the ed. Patient father in agreement.   CSW spoke with pt who understands disposition and agreeable to admission. Pt states she has issues because she needs suboxone. CSW consulted with NP who will prescribing appropriate medications to help with withdrawals from all substances.

## 2014-07-16 NOTE — Progress Notes (Signed)
CSW met with pt at bedside. Dad was present at bedside. Pt appeared to be falling asleep. Pt informed CSW that she wanted to go home with her parents.  CSW informed pt that she is currently under IVC and that we are still currently looking for placement. CSW informed pt and dad that we would update them once a bed offer has been made.  Pt and dad stated that they do not have any questions at this time.   Dad/Brier Kingsley, [place] ED CSW 07/16/2014 7:35 PM

## 2014-07-16 NOTE — ED Notes (Signed)
Pt's dad into see 

## 2014-07-16 NOTE — ED Notes (Signed)
Up to the bathroom 

## 2014-07-16 NOTE — ED Notes (Signed)
Pt sleeping at present, no acute distress noted, family at bedside, will continue to monitor for safety.
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
## 2014-07-16 NOTE — ED Notes (Signed)
Pt's mom here to see

## 2014-07-17 DIAGNOSIS — F1920 Other psychoactive substance dependence, uncomplicated: Secondary | ICD-10-CM | POA: Diagnosis present

## 2014-07-17 DIAGNOSIS — F332 Major depressive disorder, recurrent severe without psychotic features: Secondary | ICD-10-CM | POA: Diagnosis not present

## 2014-07-17 DIAGNOSIS — R45851 Suicidal ideations: Secondary | ICD-10-CM | POA: Diagnosis not present

## 2014-07-17 DIAGNOSIS — F339 Major depressive disorder, recurrent, unspecified: Secondary | ICD-10-CM | POA: Diagnosis present

## 2014-07-17 DIAGNOSIS — F1910 Other psychoactive substance abuse, uncomplicated: Secondary | ICD-10-CM | POA: Diagnosis not present

## 2014-07-17 NOTE — Progress Notes (Signed)
Pt [facility]. RN can call report to [phone]. Patient to be transported by sheriff under IVC.   Byrd HesselbachKristen Krysten Veronica, LCSW [phone]  ED CSW 07/17/2014 11:32 AM

## 2014-07-17 NOTE — BHH Counselor (Signed)
Referral faxed to the following facilities in effort to obtain inpt placement:  [facility]
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
## 2014-07-17 NOTE — Progress Notes (Signed)
CSW informed patient of admission to [facility]. Pt expressed concerns and dislike of plan. CSW and pt discussed reasons of concerns for patient safety. Pt self admits to being impulsive, a previous suicide attempt, and making a suicidal statement while angry. Patient admits to being upset regarding going to another mental hospital as she has had several mental health hospitalizations. Pt states her dad is on the way. CSW will talk with pt dad if she wishes upon his arrival.   Byrd HesselbachKristen Demesha Boorman, LCSW [phone]  ED CSW 07/17/2014 11:40 AM

## 2014-09-20 NOTE — H&P (Signed)
PATIENT NAME:  Misty Ross, [place] MR#:  [id number] DATE OF BIRTH:  Nov 24, 1990  REFERRING PHYSICIAN: [facility]   ATTENDING PHYSICIAN: Muriel Hannold B. Jennet MaduroPucilowska, MD   IDENTIFYING DATA: Misty Ross is a 24 year old female with history of depression and heroin addiction.   CHIEF COMPLAINT: "I overdosed."  HISTORY OF PRESENT ILLNESS: Misty Ross has a history of heroin abuse. She started with prescription pain killers, but now uses heroin. She was in a rehabilitation facility in June 2015, but was not able to maintain much sobriety.  On the day of admission, she had an argument with her brother and then overdosed on 20 pills of Seroquel. She cannot explain whose Seroquel this was.  Apparently, she sees a Publishing rights managernurse practitioner who prescribes stimulants. She denies that her overdose was a suicide attempt, but agrees that 20 tablets of anything, possibly vitamins, can be harmful.  [facility] and hospitalized in intensive care unit.  She was then transferred to [facility].   The patient denies any symptoms of depression, anxiety, or psychosis. There are no symptoms suggestive of bipolar mania. In addition to heroin, she uses cannabis at times and mixes heroin with benzodiazepines for a stronger effect. There was a report from [facility] that she was trying to induce her brother to bringing her drugs to the hospital.   PAST PSYCHIATRIC HISTORY:  One suicide attempt by overdose a long time ago, was unable to tell me how long. She is only 23. Recent suicide attempt by overdose on Seroquel. She has been in rehabilitation facility before.  FAMILY PSYCHIATRIC HISTORY: Brother with addiction.   PAST MEDICAL HISTORY: None.   ALLERGIES: No known drug allergies.   MEDICATIONS ON ADMISSION:  Adderall and Neurontin, unknown doses, prescribed by her primary provider  SOCIAL HISTORY: She dropped out in the 11th grade, but has GED.  She lives with her mother, father, and her  brother in [place]. 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 She has insurance.   REVIEW OF SYSTEMS:  CONSTITUTIONAL: No fevers or chills. No weight changes.  EYES: No double or blurred vision.  EARS, NOSE, THROAT: No hearing loss.  RESPIRATORY: No shortness of breath or cough.  CARDIOVASCULAR: No chest pain or orthopnea.  GASTROINTESTINAL: Mild stomach upset.  GENITOURINARY: No incontinence or frequency.  ENDOCRINE: No heat or cold intolerance.  LYMPHATIC: No anemia or easy bruising.  INTEGUMENTARY: No acne or rash.  MUSCULOSKELETAL: Muscle aches and pains.  NEUROLOGIC: No tingling or weakness.  PSYCHIATRIC: See history of present illness for details.   PHYSICAL EXAMINATION: VITAL SIGNS: Blood pressure 111/79, pulse 118, respirations 20, temperature 98.6.  GENERAL: This is a young, slender female in no acute distress.  HEENT: The pupils are equal, round, and reactive to light. Sclerae anicteric.  NECK:  Supple.  No thyromegaly.  LUNGS: Clear to auscultation. No dullness to percussion.  HEART: Regular rhythm and rate. No murmurs, rubs, or gallops.  ABDOMEN: Soft, nontender, nondistended. Positive bowel sounds.  MUSCULOSKELETAL: Normal muscle strength in all extremities.  SKIN: Needle tracks on both arms.  LYMPHATIC: No cervical adenopathy.  NEUROLOGIC: Cranial nerves II through XII are intact.   LABORATORY DATA: All laboratories were performed at [facility]. Chemistries are within normal limits. CBC within normal limits. Urine toxicology screen is positive for opiates, cocaine, benzodiazepines, and cannabinoids.   MENTAL STATUS EXAMINATION ON ADMISSION: The patient is alert and  oriented to person, place, time, and situation. She is pleasant, polite, and cooperative. She is well groomed and casually dressed. She maintains good eye contact. Her speech is of normal rate, rhythm, and volume. Mood is depressed with flat affect. Thought  process is logical and goal oriented. Thought content: She denies thoughts of hurting herself or others, but was admitted after a serious suicide attempt by overdose on 20 pills of Seroquel requiring CCU admission. There are no thoughts of hurting others. There are no delusions or paranoia. There are no auditory or visual hallucinations. Her cognition is grossly intact. Registration, recall, short- and long-term memory are intact. She is of average intelligence and fund of knowledge. Her insight and judgment are limited.   SUICIDE RISK ASSESSMENT ON ADMISSION: This is a patient with severe addiction, but no mood problems, who overdosed on medication in the context of family conflict.   INITIAL DIAGNOSES:  AXIS I: Major depressive disorder, severe, opioids, cocaine, benzodiazepine, and cannabis use disorder, severe.  AXIS II: Deferred.  AXIS III: Status post overdose.   PLAN: The patient was admitted to [facility] for safety, stabilization, and medication management.  1.  Suicidal ideation. The patient is able to contract for safety.  2.  Substance abuse. She has symptoms of opioid and benzodiazepine withdrawal with elevated heart rate, aches, pains, shaking, and sweats.  We will add Librium taper 25 mg 4 times daily for 3 days and will treat symptomatically symptoms of opioid withdrawal. 3.  Depression. The patient declines pharmacotherapy.  4.  Substance abuse treatment. She is not interested in residential treatment, believes that she needs to go to work and stay busy.  We would like to refer her to [facility] in [place].   DISPOSITION: Back with her parents when stable.   ____________________________ Ellin Goodie. Jennet Maduro, MD jbp:LT D: 05/27/2014 16:52:00 ET T: 05/27/2014 18:16:04 ET JOB#: [id number]  cc: Darleen Moffitt B. Jennet Maduro, MD, <Dictator> Shari Prows MD ELECTRONICALLY SIGNED 06/01/2014 0:58

## 2015-02-28 ENCOUNTER — Emergency Department (HOSPITAL_COMMUNITY)
Admission: EM | Admit: 2015-02-28 | Discharge: 2015-02-28 | Disposition: A | Discharge status: Home / Self Care | Payer: BC Managed Care – PPO | Attending: Emergency Medicine | Admitting: Emergency Medicine

## 2015-02-28 ENCOUNTER — Encounter (HOSPITAL_COMMUNITY): Payer: Self-pay | Admitting: *Deleted

## 2015-02-28 DIAGNOSIS — R6883 Chills (without fever): Secondary | ICD-10-CM | POA: Diagnosis not present

## 2015-02-28 DIAGNOSIS — Z720 Tobacco use: Secondary | ICD-10-CM | POA: Diagnosis not present

## 2015-02-28 DIAGNOSIS — L0291 Cutaneous abscess, unspecified: Secondary | ICD-10-CM

## 2015-02-28 DIAGNOSIS — Z79899 Other long term (current) drug therapy: Secondary | ICD-10-CM | POA: Diagnosis not present

## 2015-02-28 DIAGNOSIS — F909 Attention-deficit hyperactivity disorder, unspecified type: Secondary | ICD-10-CM | POA: Insufficient documentation

## 2015-02-28 DIAGNOSIS — R61 Generalized hyperhidrosis: Secondary | ICD-10-CM | POA: Diagnosis not present

## 2015-02-28 DIAGNOSIS — L02413 Cutaneous abscess of right upper limb: Secondary | ICD-10-CM | POA: Insufficient documentation

## 2015-02-28 DIAGNOSIS — R110 Nausea: Secondary | ICD-10-CM | POA: Diagnosis not present

## 2015-02-28 DIAGNOSIS — F419 Anxiety disorder, unspecified: Secondary | ICD-10-CM | POA: Diagnosis not present

## 2015-02-28 DIAGNOSIS — M79631 Pain in right forearm: Secondary | ICD-10-CM | POA: Diagnosis present

## 2015-02-28 DIAGNOSIS — Z8619 Personal history of other infectious and parasitic diseases: Secondary | ICD-10-CM | POA: Diagnosis not present

## 2015-02-28 NOTE — ED Provider Notes (Signed)
Patient left the ED prior to her being presented to me and prior to waiting for full evaluation. She left without notifying staff  Doug Sou, MD 02/28/15 [phone]

## 2015-02-28 NOTE — ED Notes (Signed)
Patient admits to using IV drugs.  She noticed a red area develop on the right lateral forearm that has gotten larger and more painful.  She denies any drainage.  Patient has not taken anything for pain today.

## 2015-02-28 NOTE — ED Provider Notes (Signed)
CSN: [id number]     Arrival date & time 02/28/15  1333 History   First MD Initiated Contact with Patient 02/28/15 1359     Chief Complaint  Patient presents with  . Arm Pain     (Consider location/radiation/quality/duration/timing/severity/associated sxs/prior Treatment) HPI   Patient is a 24 year old female with history of IV drug use, she admits to using heroin and cocaine, states that she missed her vein while injecting drugs 4 days ago into her right forearm.  She claims that she uses clean needles and the drugstore. She has a history of hep C which is untreated due to her continued drug use. 3 days ago she checked into a rehabilitation facility in [place]. 2 days ago she developed a red lump which had rapid progression of swelling, redness and pain. Today she states that is been painful with any movement of her hand rest elbow or arm and she left the treatment facility to seek medical treatment. She reports subjective fever and profuse sweats. She is unsure if it is withdrawal symptoms or not.  She denies nausea, vomiting, chest pain, abdominal pain, shortness of breath, noticing any swollen lymph nodes.  She denies suicidal ideation, homicidal ideation and further denies auditory or visual hallucinations.  Past Medical History  Diagnosis Date  . Allergy   . Anxiety   . Depression   . Seizures (HCC)   . ADHD (attention deficit hyperactivity disorder)    Past Surgical History  Procedure Laterality Date  . Tonsillectomy and adenoidectomy     Family History  Problem Relation Age of Onset  . Hyperlipidemia Father   . Cancer Maternal Grandmother   . Diabetes Maternal Grandmother   . Cancer Maternal Grandfather   . Heart disease Paternal Grandmother   . Cancer Paternal Grandfather   . Multiple sclerosis Brother    Social History  Substance Use Topics  . Smoking status: Current Every Day Smoker -- 0.50 packs/day    Types: Cigarettes  . Smokeless tobacco: None  . Alcohol  Use: Yes     Comment: one beer per day   OB History    No data available     Review of Systems  Constitutional: Positive for chills and diaphoresis. Negative for fever, activity change, appetite change and fatigue.  HENT: Negative.   Respiratory: Negative for cough, chest tightness, shortness of breath and wheezing.   Cardiovascular: Negative for chest pain, palpitations and leg swelling.  Gastrointestinal: Positive for nausea. Negative for vomiting, abdominal pain and diarrhea.  Genitourinary: Negative.   Musculoskeletal: Negative.  Negative for arthralgias.  Skin: Positive for color change and wound. Negative for pallor.  Neurological: Negative.  Negative for dizziness, tremors, syncope, weakness, numbness and headaches.  Hematological: Negative.   Psychiatric/Behavioral: Negative for suicidal ideas and self-injury. The patient is nervous/anxious.       Allergies  Review of patient's allergies indicates no known allergies.  Home Medications   Prior to Admission medications   Medication Sig Start Date End Date Taking? Authorizing Provider  albuterol (PROVENTIL HFA;VENTOLIN HFA) 108 (90 BASE) MCG/ACT inhaler Inhale 2 puffs into the lungs 4 (four) times daily.     Historical Provider, MD  cloNIDine (CATAPRES) 0.2 MG tablet Take 1 tablet (0.2 mg total) by mouth 3 (three) times daily. 05/26/14   Simonne Martinet, NP  folic acid (FOLVITE) 1 MG tablet Take 1 tablet (1 mg total) by mouth daily. Patient not taking: Reported on 07/15/2014 05/26/14   Simonne Martinet, NP  ibuprofen (ADVIL,MOTRIN)  200 MG tablet Take 200-800 mg by mouth every 6 (six) hours as needed for headache.     Historical Provider, MD  nicotine (NICODERM CQ - DOSED IN MG/24 HOURS) 14 mg/24hr patch Place 1 patch (14 mg total) onto the skin daily. Patient not taking: Reported on 07/15/2014 05/26/14   Simonne Martinet, NP  thiamine 100 MG tablet Take 1 tablet (100 mg total) by mouth daily. Patient not taking: Reported on 07/15/2014  05/26/14   Simonne Martinet, NP   BP 133/90 mmHg  Pulse 85  Temp(Src) 98 F (36.7 C) (Oral)  Resp 20  Wt 105 lb 2 oz (47.684 kg)  SpO2 100% Physical Exam  Constitutional: She is oriented to person, place, and time. Vital signs are normal. She appears well-developed and well-nourished. She is cooperative. She appears ill. No distress.  HENT:  Head: Normocephalic and atraumatic.  Nose: Nose normal.  Mouth/Throat: Oropharynx is clear and moist. No oropharyngeal exudate.  Eyes: Conjunctivae and EOM are normal. Pupils are equal, round, and reactive to light. Right eye exhibits no discharge. Left eye exhibits no discharge. No scleral icterus.  Neck: Normal range of motion. No JVD present. No tracheal deviation present. No thyromegaly present.  Cardiovascular: Normal rate, regular rhythm, normal heart sounds and intact distal pulses.  Exam reveals no gallop and no friction rub.   No murmur heard. Pulmonary/Chest: Effort normal and breath sounds normal. No respiratory distress. She has no wheezes. She has no rales. She exhibits no tenderness.  Abdominal: Soft. Bowel sounds are normal. She exhibits no distension and no mass. There is no tenderness. There is no rebound and no guarding.  Musculoskeletal: Normal range of motion. She exhibits no edema or tenderness.       Right shoulder: Normal.       Right elbow: Normal.      Right wrist: Normal.  Lymphadenopathy:    She has no cervical adenopathy.    She has no axillary adenopathy.       Right axillary: No pectoral and no lateral adenopathy present.  Neurological: She is alert and oriented to person, place, and time. She has normal reflexes. She is not disoriented. She displays no atrophy and no tremor. No cranial nerve deficit or sensory deficit. She exhibits normal muscle tone. She displays no seizure activity. Coordination normal.  Skin: Skin is warm and intact. No ecchymosis, no laceration and no rash noted. She is diaphoretic. There is erythema.  No cyanosis. No pallor. Nails show no clubbing.     Psychiatric: She has a normal mood and affect. Her behavior is normal. Judgment and thought content normal.  Nursing note and vitals reviewed.   ED Course  Procedures (including critical care time) Labs Review Labs Reviewed - No data to display  Imaging Review No results found. I have personally reviewed and evaluated these images and lab results as part of my medical decision-making.   EKG Interpretation None      MDM   Final diagnoses:  Abscess    Patient was seen and evaluated for a right forearm infection, likely abscess, after injecting drugs into her arm 4 days ago.  She had tenderness throughout her right arm, worse over a large abscess. It is fluctuant with erythema, but no surrounding induration. No spontaneous drainage.  Patient was notified of plan to obtain basic labs. Right forearm x-ray ordered. I discussed with the patient a plan to check basic labs, would likely need I&D procedure with antibiotic, possible need for  IV antibiotics.  She was ill appearing, N and diaphoretic, however, she also was detoxing from cocaine and heroine.  She did not display any tremor, had no active vomiting.  Placement of an IV was deferred until after imaging and lab results. The nurse reports approximately 10 minutes after she was seen she eloped from the room before obtaining any x-rays imaging.    Danelle Berry, PA-C 03/03/15 2053  Doug Sou, MD 03/04/15 0030

## 2015-03-01 ENCOUNTER — Encounter (HOSPITAL_COMMUNITY): Payer: Self-pay | Admitting: Emergency Medicine

## 2015-03-01 ENCOUNTER — Emergency Department (HOSPITAL_COMMUNITY)
Admission: EM | Admit: 2015-03-01 | Discharge: 2015-03-01 | Discharge status: Against Medical Advice | Payer: BC Managed Care – PPO | Attending: Emergency Medicine | Admitting: Emergency Medicine

## 2015-03-01 DIAGNOSIS — Z720 Tobacco use: Secondary | ICD-10-CM | POA: Diagnosis not present

## 2015-03-01 DIAGNOSIS — L02511 Cutaneous abscess of right hand: Secondary | ICD-10-CM | POA: Insufficient documentation

## 2015-03-01 NOTE — ED Notes (Signed)
CALLED PATIENT NAME SEVERAL TIMES TO COLLECT BLOOD  AND NO ANSWER.

## 2015-03-01 NOTE — ED Notes (Signed)
Pt called from triage, no answer 

## 2015-03-01 NOTE — ED Notes (Signed)
Called pt for lab draw twice and no answer.  Notified triage nurse.

## 2015-03-01 NOTE — ED Notes (Signed)
Per pt, states she is an IV drug user-right forearm abscess

## 2015-03-16 IMAGING — CT CT HEAD W/O CM
1 series · 16 of 30 positions shown, 20 images · non-contrast
Comparison: July 23, 2010

CLINICAL DATA: History of psychiatric illness and heroin abuse.
Status post overdose on May 22, 2014.

EXAM:
CT HEAD WITHOUT CONTRAST
TECHNIQUE: Contiguous axial images were obtained from the base of the skull
through the vertex without intravenous contrast.

[Series 2: headseq 4.8 h45s · axial · 0.43mm/px · z∈[-148,-20]mm · 16 of 30 slices shown, 20 images]
[im 2/30  brain]
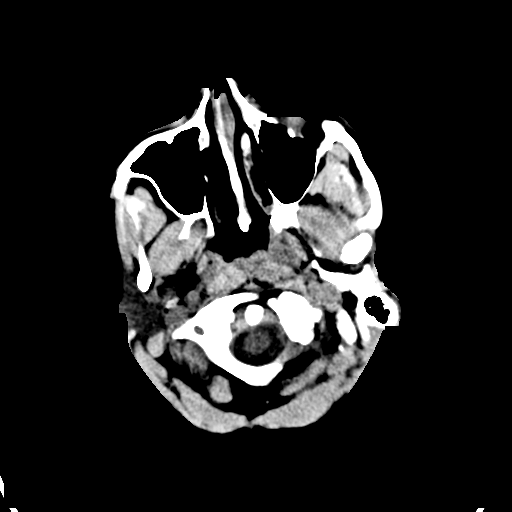
[im 2/30  bone]
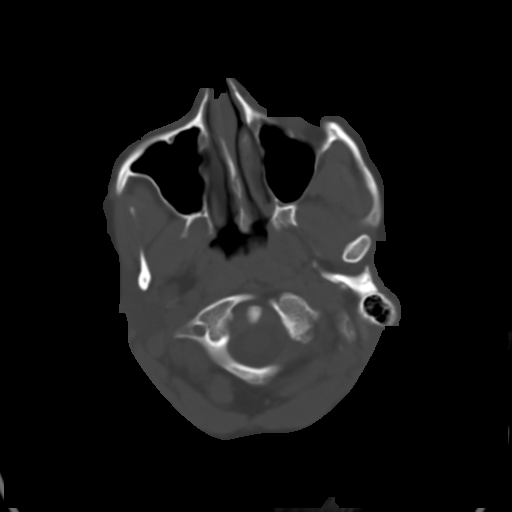
[im 4/30  brain]
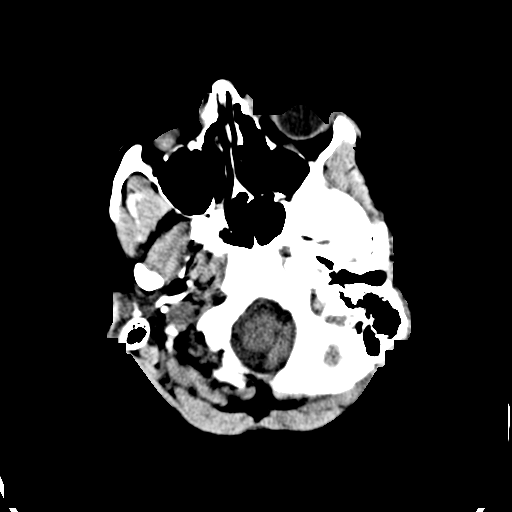
[im 6/30  brain]
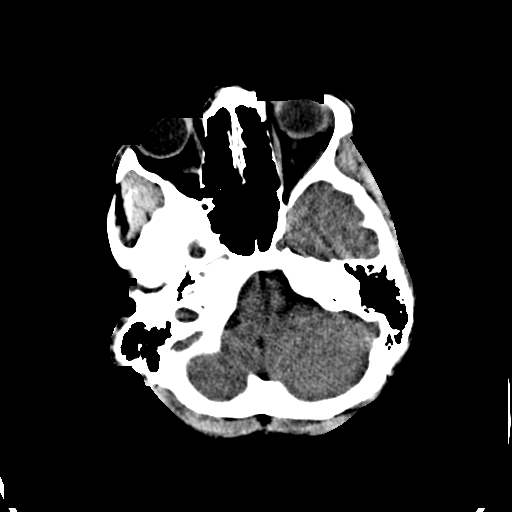
[im 8/30  brain]
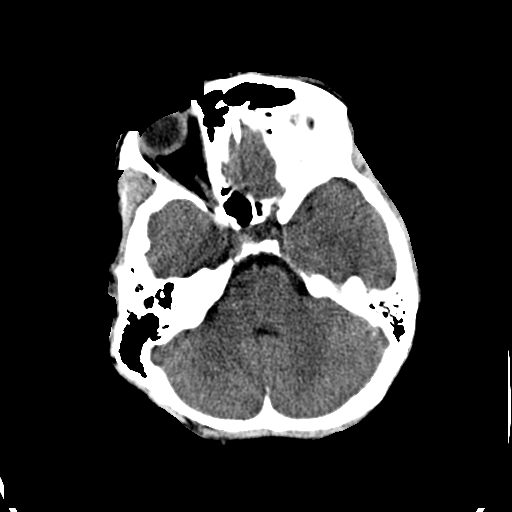
[im 9/30  brain]
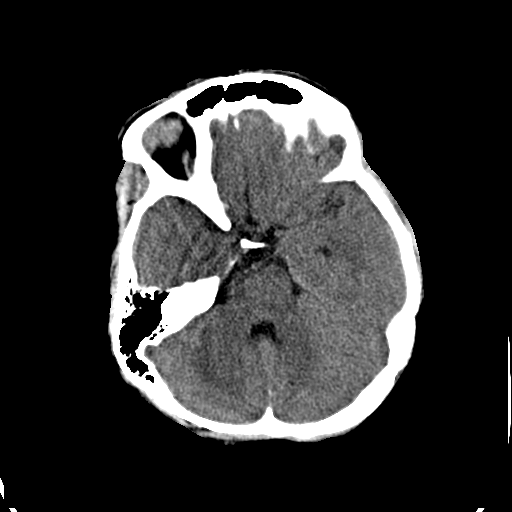
[im 9/30  bone]
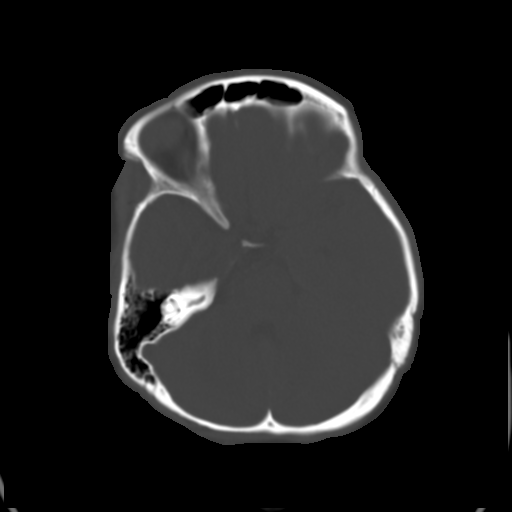
[im 11/30  brain]
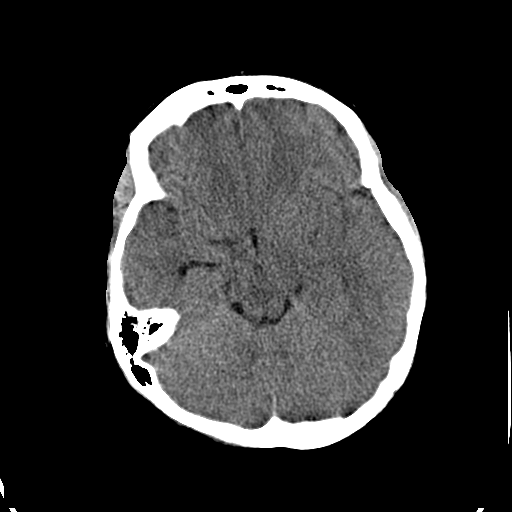
[im 13/30  brain]
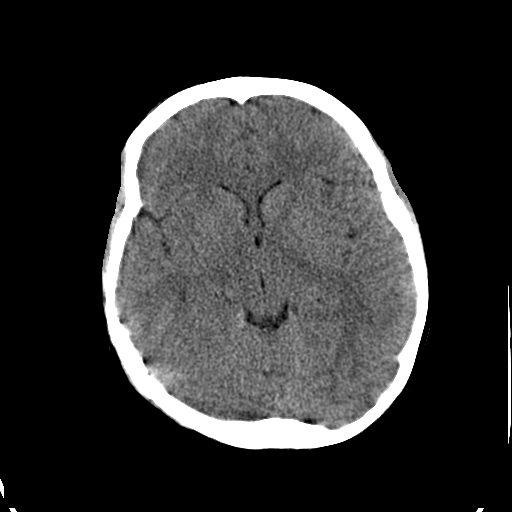
[im 15/30  brain]
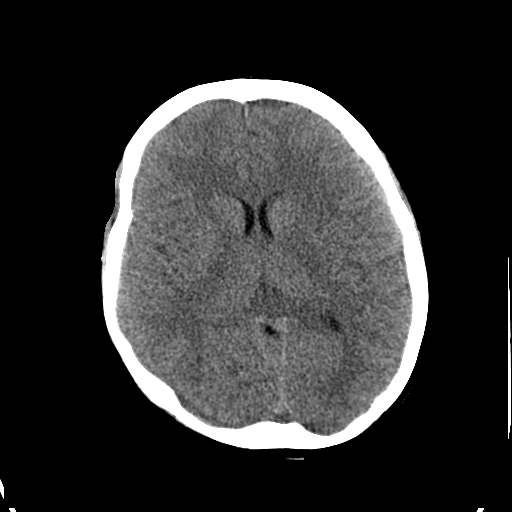
[im 16/30  brain]
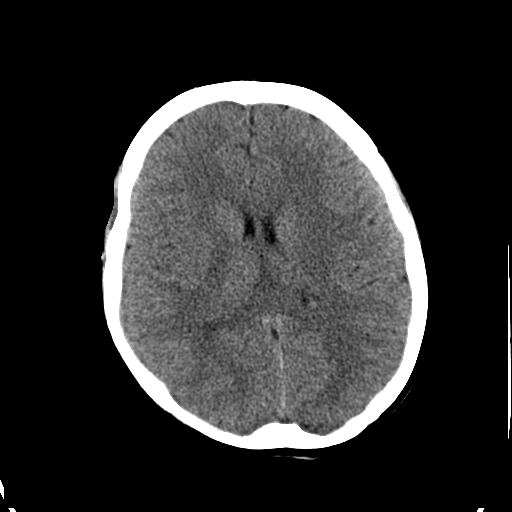
[im 16/30  bone]
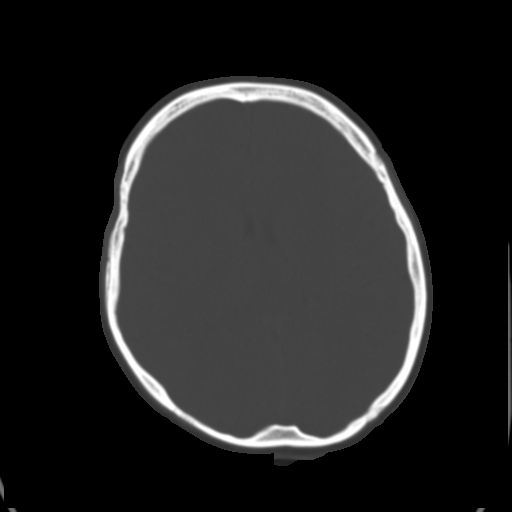
[im 18/30  brain]
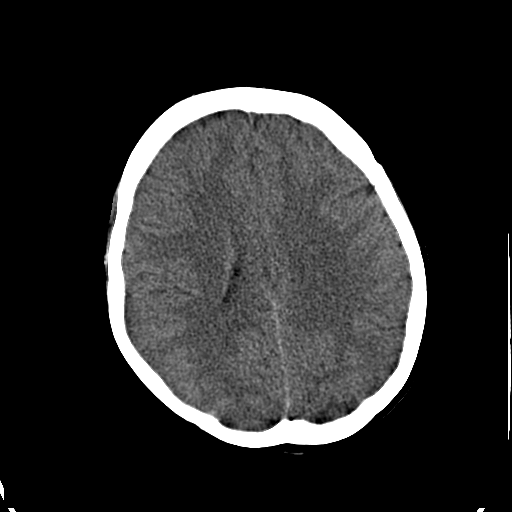
[im 20/30  brain]
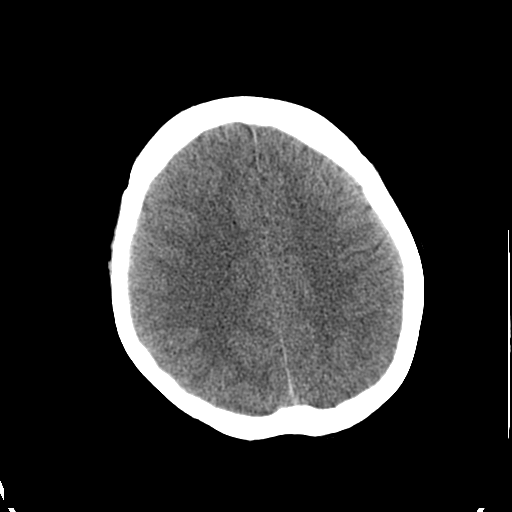
[im 22/30  brain]
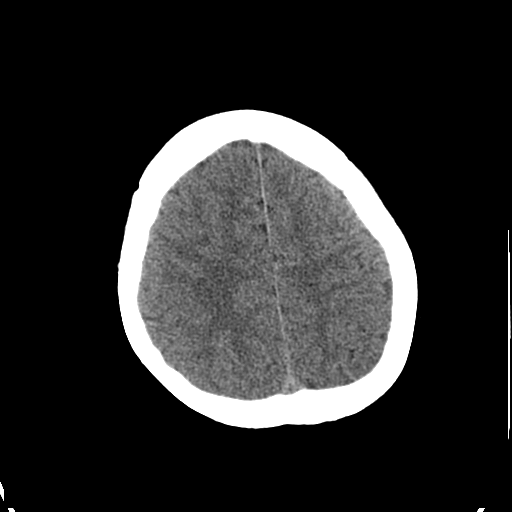
[im 23/30  brain]
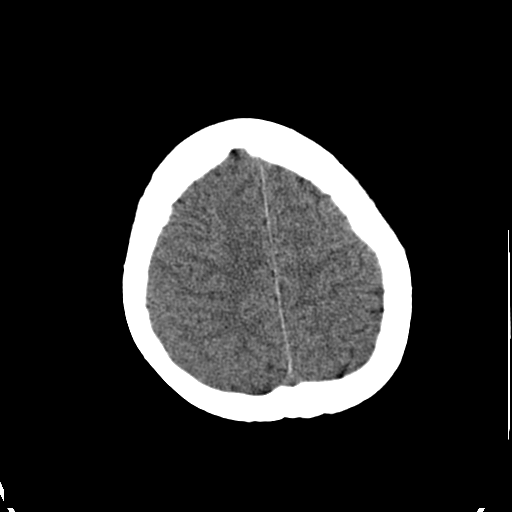
[im 23/30  bone]
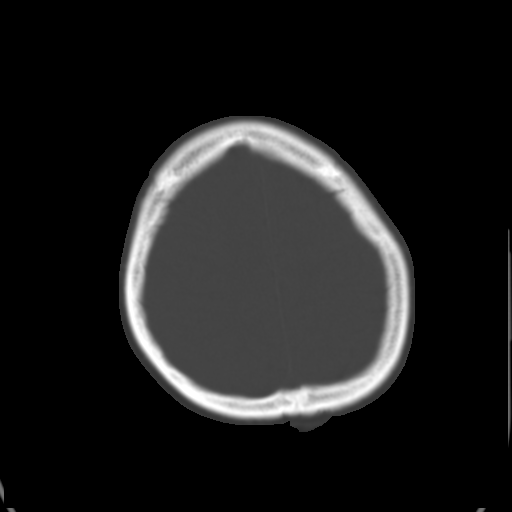
[im 25/30  brain]
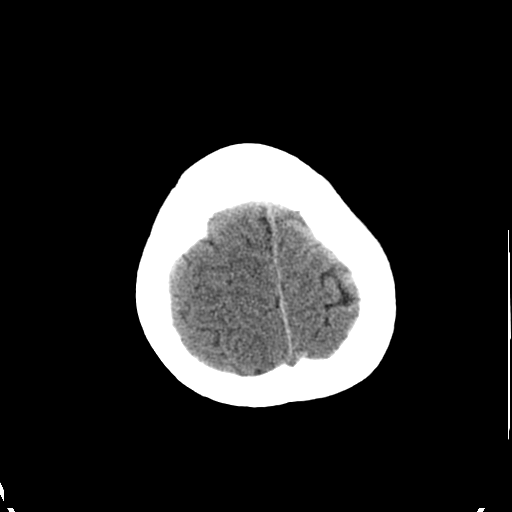
[im 27/30  brain]
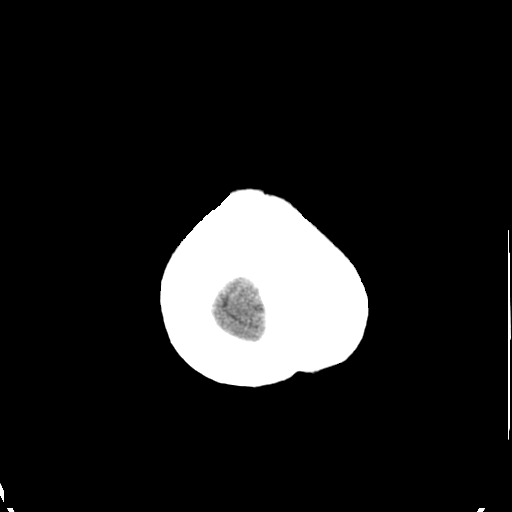
[im 29/30  brain]
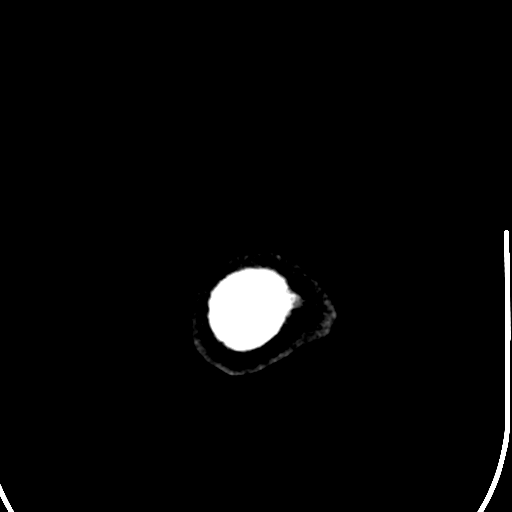

[16 of 30 positions shown; findings below may reference images not displayed]

FINDINGS: There is no midline shift, hydrocephalus, or mass. No acute
hemorrhage or acute transcortical infarct is identified. The bony
calvarium is intact. The visualized sinuses are clear.
IMPRESSION: Normal CT of the brain.

## 2015-05-22 DIAGNOSIS — F1910 Other psychoactive substance abuse, uncomplicated: Secondary | ICD-10-CM | POA: Insufficient documentation

## 2015-05-22 DIAGNOSIS — F1210 Cannabis abuse, uncomplicated: Secondary | ICD-10-CM | POA: Insufficient documentation

## 2015-05-22 DIAGNOSIS — B182 Chronic viral hepatitis C: Secondary | ICD-10-CM | POA: Insufficient documentation

## 2015-05-22 DIAGNOSIS — F1410 Cocaine abuse, uncomplicated: Secondary | ICD-10-CM | POA: Insufficient documentation

## 2018-01-03 LAB — CULTURE, URINE: FINAL REPORT: 25000

## 2018-02-21 LAB — HEPATIC FUNCTION PANEL
ALT: 17 U/L (ref 0–33)
AST: 21 U/L (ref 0–32)
Albumin: 5 g/dL (ref 3.5–5.2)
Alk Phosphatase: 36 U/L (ref 35–117)
Bilirubin, Direct: <0.2 mg/dL (ref 0.00–0.30)
Total Bilirubin: 0.4 mg/dL (ref 0.00–1.20)
Total Protein: 7.5 g/dL (ref 6.4–8.3)

## 2018-02-21 LAB — LIPID PANEL
Chol/HDL Ratio: 2.7 (ref 0.0–4.4)
Cholesterol: 194 mg/dL (ref 100–200)
HDL: 71 mg/dL (ref 65–96)
LDL Cholesterol: 109 mg/dL — ABNORMAL HIGH (ref 0.0–100.0)
LDL/HDL Ratio: 1.5
Triglycerides: 72 mg/dL (ref 0–149)
VLDL: 14.4 mg/dL (ref 5.0–40.0)

## 2018-02-21 LAB — HEMOGLOBIN A1C
Est. Avg. Glucose, WB: 108
Est. Avg. Glucose-calculated: 115
Hemoglobin A1C: 5.4 % (ref 4.0–6.0)

## 2018-02-22 LAB — CBC WITH AUTO DIFFERENTIAL
Absolute Eos #: 0.1 10*3/uL (ref 0.0–0.5)
Absolute Lymph #: 2.2 10*3/uL (ref 1.0–3.2)
Absolute Mono #: 0.5 10*3/uL (ref 0.3–1.0)
Basophils %: 0.7 % (ref 0.0–2.0)
Eosinophils %: 1 % (ref 0.0–7.0)
Hematocrit: 42 % (ref 34.0–47.0)
Hemoglobin: 14.5 g/dL (ref 11.5–15.7)
Lymphocytes: 27.6 % (ref 15.0–45.0)
MCH: 29.8 pg (ref 27.0–34.5)
MCHC: 34.4 g/dL (ref 32.0–36.0)
MCV: 86.5 fL (ref 81.0–99.0)
MPV: 9.6 fL (ref 7.2–13.2)
Monocytes: 6.3 % (ref 4.0–12.0)
Neutrophils %: 64.4 % (ref 42.0–74.0)
Neutrophils Absolute: 5.1 10*3/uL (ref 1.6–7.3)
Platelets: 129 10*3/uL — ABNORMAL LOW (ref 140–440)
RBC: 4.86 x10e6/mcL (ref 3.60–5.20)
RDW: 12.8 % (ref 11.0–16.0)
WBC: 7.9 10*3/uL (ref 3.8–10.6)

## 2018-02-22 LAB — TSH WITH REFLEX TO FT4: TSH: 1.11 mcIU/mL (ref 0.358–3.740)

## 2018-03-26 LAB — URINALYSIS WITH REFLEX TO CULTURE
Bilirubin Urine: NEGATIVE
Blood, Urine: NEGATIVE
Glucose, UA: NEGATIVE mg/dL
Ketones, Urine: NEGATIVE mg/dL
Leukocyte Esterase, Urine: NEGATIVE
Nitrite, Urine: NEGATIVE
Protein, UA: NEGATIVE
Specific Gravity, UA: 1.015 (ref 1.003–1.035)
Urobilinogen, Urine: 0.2 EU/dL
pH, UA: 7.5 (ref 4.5–8.0)

## 2018-03-26 LAB — MICROALBUMIN, UR: Microalb, Ur: 0.4 mg/dL (ref 0.0–20.0)

## 2018-03-27 LAB — CBC WITH AUTO DIFFERENTIAL
Absolute Eos #: 0.1 10*3/uL (ref 0.0–0.5)
Absolute Lymph #: 1.7 10*3/uL (ref 1.0–3.2)
Absolute Mono #: 0.5 10*3/uL (ref 0.3–1.0)
Basophils %: 0.9 % (ref 0.0–2.0)
Eosinophils %: 1.9 % (ref 0.0–7.0)
Hematocrit: 39.1 % (ref 34.0–47.0)
Hemoglobin: 13.6 g/dL (ref 11.5–15.7)
Lymphocytes: 24.8 % (ref 15.0–45.0)
MCH: 30.2 pg (ref 27.0–34.5)
MCHC: 34.7 g/dL (ref 32.0–36.0)
MCV: 87 fL (ref 81.0–99.0)
MPV: 9.4 fL (ref 7.2–13.2)
Monocytes: 6.6 % (ref 4.0–12.0)
Neutrophils %: 65.8 % (ref 42.0–74.0)
Neutrophils Absolute: 4.5 10*3/uL (ref 1.6–7.3)
Platelets: 241 10*3/uL (ref 140–440)
RBC: 4.49 x10e6/mcL (ref 3.60–5.20)
RDW: 13.2 % (ref 11.0–16.0)
WBC: 6.8 10*3/uL (ref 3.8–10.6)

## 2018-03-27 LAB — COMPREHENSIVE METABOLIC PANEL
ALT: 16 U/L (ref 0–33)
AST: 20 U/L (ref 0–32)
Albumin/Globulin Ratio: 1.8 mmol/L (ref 1.00–2.00)
Albumin: 4.4 g/dL (ref 3.5–5.2)
Alk Phosphatase: 38 U/L (ref 35–117)
Anion Gap: 10 mmol/L (ref 2–17)
BUN: 18 mg/dL (ref 6–20)
CO2: 28 mmol/L (ref 22–29)
Calcium: 9.6 mg/dL (ref 8.6–10.0)
Chloride: 104 mmol/L (ref 98–107)
Creatinine: 0.6 mg/dL (ref 0.5–0.9)
GFR African American: 146 mL/min/{1.73_m2} (ref >=90)
GFR Non-African American: 126 mL/min/{1.73_m2} (ref >=90)
Globulin: 3 g/dL (ref 1.9–4.4)
Glucose: 85 mg/dL (ref 70–99)
OSMOLALITY CALCULATED: 284 mOsm/kg (ref 270–287)
Potassium: 4.6 mmol/L (ref 3.5–5.3)
Sodium: 142 mmol/L (ref 135–145)
Total Bilirubin: 0.5 mg/dL (ref 0.00–1.20)
Total Protein: 6.9 g/dL (ref 6.4–8.3)

## 2018-03-27 LAB — LIPID PANEL
Chol/HDL Ratio: 2.5 (ref 0.0–4.4)
Cholesterol: 170 mg/dL (ref 100–200)
HDL: 68 mg/dL (ref 65–96)
LDL Cholesterol: 87 mg/dL (ref 0.0–100.0)
LDL/HDL Ratio: 1.3
Triglycerides: 74 mg/dL (ref 0–149)
VLDL: 14.8 mg/dL (ref 5.0–40.0)

## 2018-03-27 LAB — TSH: TSH, 3RD GENERATION: 1.85 mcIU/mL (ref 0.358–3.740)

## 2019-03-13 LAB — COMPREHENSIVE METABOLIC PANEL
ALT: 30 U/L (ref 0–33)
AST: 33 U/L — ABNORMAL HIGH (ref 0–32)
Albumin/Globulin Ratio: 2.2 mmol/L — ABNORMAL HIGH (ref 1.00–2.00)
Albumin: 4.8 g/dL (ref 3.5–5.2)
Alk Phosphatase: 42 U/L (ref 35–117)
Anion Gap: 10 mmol/L (ref 2–17)
BUN: 12 mg/dL (ref 6–20)
CO2: 26 mmol/L (ref 22–29)
Calcium: 9.5 mg/dL (ref 8.6–10.0)
Chloride: 105 mmol/L (ref 98–107)
Creatinine: 0.6 mg/dL (ref 0.5–0.9)
GFR African American: 146 mL/min/{1.73_m2} (ref >=90)
GFR Non-African American: 126 mL/min/{1.73_m2} (ref >=90)
Globulin: 2 g/dL (ref 1.9–4.4)
Glucose: 80 mg/dL (ref 70–99)
OSMOLALITY CALCULATED: 280 mOsm/kg (ref 270–287)
Potassium: 4.6 mmol/L (ref 3.5–5.3)
Sodium: 141 mmol/L (ref 135–145)
Total Bilirubin: 0.6 mg/dL (ref 0.00–1.20)
Total Protein: 7 g/dL (ref 6.4–8.3)

## 2019-03-13 LAB — CBC WITH AUTO DIFFERENTIAL
Absolute Baso #: 0 10*3/uL (ref 0.0–0.2)
Absolute Eos #: 0.1 10*3/uL (ref 0.0–0.5)
Absolute Lymph #: 1.5 10*3/uL (ref 1.0–3.2)
Absolute Mono #: 0.7 10*3/uL (ref 0.3–1.0)
Basophils %: 0.5 % (ref 0.0–2.0)
Eosinophils %: 0.8 % (ref 0.0–7.0)
Hematocrit: 46 % (ref 34.0–47.0)
Hemoglobin: 15.6 g/dL (ref 11.5–15.7)
Immature Grans (Abs): 0.05 10*3/uL (ref 0.00–0.06)
Immature Granulocytes: 0.6 % (ref 0.1–0.6)
Lymphocytes: 18.5 % (ref 15.0–45.0)
MCH: 32 pg (ref 27.0–34.5)
MCHC: 33.9 g/dL (ref 32.0–36.0)
MCV: 94.5 fL (ref 81.0–99.0)
MPV: 11.1 fL (ref 7.2–13.2)
Monocytes: 9.2 % (ref 4.0–12.0)
NRBC Absolute: 0 10*3/uL (ref 0.000–0.012)
NRBC Automated: 0 % (ref 0.0–0.2)
Neutrophils %: 70.4 % (ref 42.0–74.0)
Neutrophils Absolute: 5.6 10*3/uL (ref 1.6–7.3)
Platelets: 144 10*3/uL (ref 140–440)
RBC: 4.87 x10e6/mcL (ref 3.60–5.20)
RDW: 12.6 % (ref 11.0–16.0)
WBC: 7.9 10*3/uL (ref 3.8–10.6)

## 2019-03-15 LAB — CHLAMYDIA, GONORRHEA, TRICHOMONIASIS
Chlamydia trachomatis, NAA: NEGATIVE
Neisseria Gonorrhoeae, NAA: NEGATIVE
Trichomonas Vaginalis by NAA: NEGATIVE

## 2019-06-13 NOTE — ED Notes (Signed)
ED Patient Education Note     ;Patient Education Materials Follows:           Additional Instructions    We discussed the findings on your urine testing today.  Do not start antibiotics right away but should you develop more classic symptoms of a urinary tract infection, you have a prescription to take for 3 days.  Return here at anytime with concerns of worsening condition.      -         Easy-to-Read     Urinary Tract Infection    A urinary tract infection (UTI) can occur any place along the urinary tract. The tract includes the kidneys, ureters, bladder, and urethra. A type of germ called bacteria often causes a UTI. UTIs are often helped with antibiotic medicine.       HOME CARE     If given, take antibiotics as told by your doctor. Finish them even if you start to feel better.     Drink enough fluids to keep your pee (urine) clear or pale yellow.     Avoid tea, drinks with caffeine, and bubbly (carbonated) drinks.     Pee often. Avoid holding your pee in for a long time.      Pee before and after having sex (intercourse).     Wipe from front to back after you poop (bowel movement) if you are a woman. Use each tissue only once.    GET HELP RIGHT AWAY IF:     You have back pain.     You have lower belly (abdominal) pain.     You have chills.     You feel sick to your stomach (nauseous).     You throw up (vomit).     Your burning or discomfort with peeing does not go away.     You have a fever.     Your symptoms are not better in 3 days.     MAKE SURE YOU:     Understand these instructions.     Will watch your condition.     Will get help right away if you are not doing well or get worse.    This information is not intended to replace advice given to you by your health care provider. Make sure you discuss any questions you have with your health care provider.    Document Released: 10/25/2007 Document Revised: 05/29/2014 Document Reviewed: 03/29/2015  [employer]vier Interactive Patient Education ?2016 [employer].       Emergency Medicine     Recurrent Migraine Headache    A migraine headache is an intense, throbbing pain on one or both sides of your head. Recurrent migraines keep coming back. A migraine can last for 30 minutes to several hours.      CAUSES    The exact cause of a migraine headache is not always known. However, a migraine may be caused when nerves in the brain become irritated and release chemicals that cause inflammation. This causes pain.    Certain things may also trigger migraines, such as:      Alcohol.     Smoking.     Stress.      Menstruation.     Aged cheeses.     Foods or drinks that contain nitrates, glutamate, aspartame, or tyramine.      Lack of sleep.      Chocolate.      Caffeine.     Hunger.     Physical  exertion.     Fatigue.      Medicines used to treat chest pain (nitroglycerine), birth control pills, estrogen, and some blood pressure medicines.    SYMPTOMS     Pain on one or both sides of your head.     Pulsating or throbbing pain.     Severe pain that prevents daily activities.     Pain that is aggravated by any physical activity.     Nausea, vomiting, or both.      Dizziness.      Pain with exposure to bright lights, loud noises, or activity.     General sensitivity to bright lights, loud noises, or smells.    Before you get a migraine, you may get warning signs that a migraine is coming (aura). An aura may include:     Seeing flashing lights.     Seeing bright spots, halos, or zigzag lines.      Having tunnel vision or blurred vision.     Having feelings of numbness or tingling.      Having trouble talking.     Having muscle weakness.     DIAGNOSIS    A recurrent migraine headache is often diagnosed based on:     Symptoms.      Physical examination.     A CT scan or MRI of your head. These imaging tests cannot diagnose migraines but can help rule out other causes of headaches. ?    TREATMENT    Medicines may be given for pain and nausea. Medicines can also be given to help prevent recurrent  migraines.    HOME CARE INSTRUCTIONS     Only take over-the-counter or prescription medicines for pain or discomfort as directed by your health care provider. The use of long-term narcotics is not recommended.      Lie down in a dark, quiet room when you have a migraine.      Keep a journal to find out what may trigger your migraine headaches. For example, write down:    ? What you eat and drink.    ? How much sleep you get.    ? Any change to your diet or medicines.     Limit alcohol consumption.     Quit smoking if you smoke.      Get 7?9 hours of sleep, or as recommended by your health care provider.     Limit stress.      Keep lights dim if bright lights bother you and make your migraines worse.    SEEK MEDICAL CARE IF:     You do not get relief from the medicines given to you.      You have a recurrence of pain.      You have a fever.     SEEK IMMEDIATE MEDICAL CARE IF:     Your migraine becomes severe.     You have a stiff neck.     You have loss of vision.     You have muscular weakness or loss of muscle control.     You start losing your balance or have trouble walking.     You feel faint or pass out.     You have severe symptoms that are different from your first symptoms.     MAKE SURE YOU:     Understand these instructions.      Will watch your condition.     Will get help right away if you are not  doing well or get worse.    This information is not intended to replace advice given to you by your health care provider. Make sure you discuss any questions you have with your health care provider.    Document Released: 01/31/2001 Document Revised: 05/29/2014 Document Reviewed: 01/13/2013  Elsevier Interactive Patient Education ?2016 [employer]         Gastritis, Adult    Gastritis is soreness and swelling (inflammation) of the lining of the stomach. Gastritis can develop as a sudden onset (acute) or long-term (chronic) condition. If gastritis is not treated, it can lead to stomach bleeding and ulcers.       CAUSES    Gastritis occurs when the stomach lining is weak or damaged. Digestive juices from the stomach then inflame the weakened stomach lining. The stomach lining may be weak or damaged due to viral or bacterial infections. One common bacterial infection is the Helicobacter pylori  infection. Gastritis can also result from excessive alcohol consumption, taking certain medicines, or having too much acid in the stomach.     SYMPTOMS    In some cases, there are no symptoms. When symptoms are present, they may include:     Pain or a burning sensation in the upper abdomen.     Nausea.     Vomiting.     An uncomfortable feeling of fullness after eating.     DIAGNOSIS    Your caregiver may suspect you have gastritis based on your symptoms and a physical exam. To determine the cause of your gastritis, your caregiver may perform the following:     Blood or stool tests to check for the H pylori bacterium.     Gastroscopy. A thin, flexible tube (endoscope) is passed down the esophagus and into the stomach. The endoscope has a light and camera on the end. Your caregiver uses the endoscope to view the inside of the stomach.     Taking a tissue sample (biopsy) from the stomach to examine under a microscope.     TREATMENT    Depending on the cause of your gastritis, medicines may be prescribed. If you have a bacterial infection, such as an H pylori infection, antibiotics may be given. If your gastritis is caused by too much acid in the stomach, H2 blockers or antacids may be given. Your caregiver may recommend that you stop taking aspirin, ibuprofen, or other nonsteroidal anti-inflammatory drugs (NSAIDs).    HOME CARE INSTRUCTIONS     Only take over-the-counter or prescription medicines as directed by your caregiver.     If you were given antibiotic medicines, take them as directed. Finish them even if you start to feel better.     Drink enough fluids to keep your urine clear or pale yellow.      Avoid foods and drinks that  make your symptoms worse, such as:    ? Caffeine or alcoholic drinks.    ? Chocolate.    ? Peppermint or mint flavorings.    ? Garlic and onions.    ? Spicy foods.    ? Citrus fruits, such as oranges, lemons, or limes.    ? Tomato-based foods such as sauce, chili, salsa, and pizza.    ? Fried and fatty foods.     Eat small, frequent meals instead of large meals.    SEEK IMMEDIATE MEDICAL CARE IF:     You have black or dark red stools.     You vomit blood or material that  looks like coffee grounds.     You are unable to keep fluids down.     Your abdominal pain gets worse.     You have a fever.     You do not feel better after 1 week.      You have any other questions or concerns.    MAKE SURE YOU:     Understand these instructions.     Will watch your condition.     Will get help right away if you are not doing well or get worse.    This information is not intended to replace advice given to you by your health care provider. Make sure you discuss any questions you have with your health care provider.    Document Released: 05/02/2001 Document Revised: 11/07/2011 Document Reviewed: 06/21/2011  [employer]vier Interactive Patient Education ?2016 [employer]

## 2019-06-13 NOTE — ED Notes (Signed)
 ED Patient Summary       ;       [facility] Emergency Department  [address] [place], [place] [address]  [phone]  Discharge Instructions (Patient)  _______________________________________     Name: Sandra Li, Sandra Li  DOB:  [birth year]                   MRN: [id number]                   FIN: NBR%[phone]  Reason For Visit: Headache; Abdominal pain; HEADACHE  Final Diagnosis: Gastritis; Intractable headache     Visit Date: 06/13/2019 17:19:00  Address: [address] [place] [address]  Phone: [phone]     Emergency Department Providers:         Primary Physician:   MAYME CHARLESTON [facility] would like to thank you for allowing us  to assist you with your healthcare needs. The following includes patient education materials and information regarding your injury/illness.     Follow-up Instructions:  You were seen today on an emergency basis. Please contact your primary care doctor for a follow up appointment. If you received a referral to a specialist doctor, it is important you follow-up as instructed.    It is important that you call your follow-up doctor to schedule and confirm the location of your next appointment. Your doctor may practice at multiple locations. The office location of your follow-up appointment may be different to the one written on your discharge instructions.    If you do not have a primary care doctor, please call [phone] for help in finding a [facility] Provider. For help in finding a specialist doctor, please call [phone].    The [facility] "Ask a Nurse" line in staffed by Registered Nurses and is a free service to the community. We are available Monday - Friday from 8am to 5pm to answer your questions about your health. Please call [phone].    If your condition gets worse before your follow-up with your primary care doctor or specialist, please return to the Emergency  Department.      Coronavirus 2019 (COVID-19) Reminders:     Patients 70 and older can contact their [facility] doctors' offices to schedule appointments to receive the COVID-19 vaccine at the [facility] or send us  an email at [email or web address].            Scan this code with your phone camera to send an email to the address above.      Patients who are 46 and older who do not have a [facility] physician can call [phone] to schedule vaccination appointments.         Follow Up Appointments:  Primary Care Provider:      Name: [name]      Phone: [phone]                 With: Address: When:   Follow up with primary care provider  Within 5 to 7 days   Comments:   Return to ED if symptoms worsen              Printed Prescriptions:    Patient Education Materials:  Discharge Orders          Discharge Patient 06/13/19 20:15:00 EST  Comment:      Additional Instructions (Custom); Urinary Tract Infection, Adult, Easy-to-Read; Recurrent Migraine Headache; Gastritis, Adult           Additional Instructions    We discussed the findings on your urine testing today.  Do not start antibiotics right away but should you develop more classic symptoms of a urinary tract infection, you have a prescription to take for 3 days.  Return here at anytime with concerns of worsening condition.      -          Urinary Tract Infection    A urinary tract infection (UTI) can occur any place along the urinary tract. The tract includes the kidneys, ureters, bladder, and urethra. A type of germ called bacteria often causes a UTI. UTIs are often helped with antibiotic medicine.       HOME CARE     If given, take antibiotics as told by your doctor. Finish them even if you start to feel better.     Drink enough fluids to keep your pee (urine) clear or pale yellow.     Avoid tea, drinks with caffeine, and bubbly (carbonated) drinks.     Pee often. Avoid holding your pee  in for a long time.      Pee before and after having sex (intercourse).     Wipe from front to back after you poop (bowel movement) if you are a woman. Use each tissue only once.    GET HELP RIGHT AWAY IF:     You have back pain.     You have lower belly (abdominal) pain.     You have chills.     You feel sick to your stomach (nauseous).     You throw up (vomit).     Your burning or discomfort with peeing does not go away.     You have a fever.     Your symptoms are not better in 3 days.     MAKE SURE YOU:     Understand these instructions.     Will watch your condition.     Will get help right away if you are not doing well or get worse.    This information is not intended to replace advice given to you by your health care provider. Make sure you discuss any questions you have with your health care provider.    Document Released: 10/25/2007 Document Revised: 05/29/2014 Document Reviewed: 03/29/2015  Elsevier Interactive Patient Education ?2016 Elsevier Inc.       Recurrent Migraine Headache    A migraine headache is an intense, throbbing pain on one or both sides of your head. Recurrent migraines keep coming back. A migraine can last for 30 minutes to several hours.      CAUSES    The exact cause of a migraine headache is not always known. However, a migraine may be caused when nerves in the brain become irritated and release chemicals that cause inflammation. This causes pain.    Certain things may also trigger migraines, such as:      Alcohol.     Smoking.     Stress.      Menstruation.     Aged cheeses.     Foods or drinks that contain nitrates, glutamate, aspartame, or tyramine.      Lack of sleep.      Chocolate.      Caffeine.     Hunger.     Physical exertion.  Fatigue.      Medicines used to treat chest pain (nitroglycerine), birth control pills, estrogen, and some blood pressure medicines.    SYMPTOMS     Pain on one or both sides of your head.     Pulsating or throbbing pain.     Severe pain that  prevents daily activities.     Pain that is aggravated by any physical activity.     Nausea, vomiting, or both.      Dizziness.      Pain with exposure to bright lights, loud noises, or activity.     General sensitivity to bright lights, loud noises, or smells.    Before you get a migraine, you may get warning signs that a migraine is coming (aura). An aura may include:     Seeing flashing lights.     Seeing bright spots, halos, or zigzag lines.      Having tunnel vision or blurred vision.     Having feelings of numbness or tingling.      Having trouble talking.     Having muscle weakness.     DIAGNOSIS    A recurrent migraine headache is often diagnosed based on:     Symptoms.      Physical examination.     A CT scan or MRI of your head. These imaging tests cannot diagnose migraines but can help rule out other causes of headaches. ?    TREATMENT    Medicines may be given for pain and nausea. Medicines can also be given to help prevent recurrent migraines.    HOME CARE INSTRUCTIONS     Only take over-the-counter or prescription medicines for pain or discomfort as directed by your health care provider. The use of long-term narcotics is not recommended.      Lie down in a dark, quiet room when you have a migraine.      Keep a journal to find out what may trigger your migraine headaches. For example, write down:    ? What you eat and drink.    ? How much sleep you get.    ? Any change to your diet or medicines.     Limit alcohol consumption.     Quit smoking if you smoke.      Get 7?9 hours of sleep, or as recommended by your health care provider.     Limit stress.      Keep lights dim if bright lights bother you and make your migraines worse.    SEEK MEDICAL CARE IF:     You do not get relief from the medicines given to you.      You have a recurrence of pain.      You have a fever.     SEEK IMMEDIATE MEDICAL CARE IF:     Your migraine becomes severe.     You have a stiff neck.     You have loss of vision.     You  have muscular weakness or loss of muscle control.     You start losing your balance or have trouble walking.     You feel faint or pass out.     You have severe symptoms that are different from your first symptoms.     MAKE SURE YOU:     Understand these instructions.      Will watch your condition.     Will get help right away if you are not doing well or get  worse.    This information is not intended to replace advice given to you by your health care provider. Make sure you discuss any questions you have with your health care provider.    Document Released: 01/31/2001 Document Revised: 05/29/2014 Document Reviewed: 01/13/2013  Elsevier Interactive Patient Education ?2016 Elsevier Inc.       Gastritis, Adult    Gastritis is soreness and swelling (inflammation) of the lining of the stomach. Gastritis can develop as a sudden onset (acute) or long-term (chronic) condition. If gastritis is not treated, it can lead to stomach bleeding and ulcers.      CAUSES    Gastritis occurs when the stomach lining is weak or damaged. Digestive juices from the stomach then inflame the weakened stomach lining. The stomach lining may be weak or damaged due to viral or bacterial infections. One common bacterial infection is the Helicobacter pylori  infection. Gastritis can also result from excessive alcohol consumption, taking certain medicines, or having too much acid in the stomach.     SYMPTOMS    In some cases, there are no symptoms. When symptoms are present, they may include:     Pain or a burning sensation in the upper abdomen.     Nausea.     Vomiting.     An uncomfortable feeling of fullness after eating.     DIAGNOSIS    Your caregiver may suspect you have gastritis based on your symptoms and a physical exam. To determine the cause of your gastritis, your caregiver may perform the following:     Blood or stool tests to check for the H pylori bacterium.     Gastroscopy. A thin, flexible tube (endoscope) is passed down the  esophagus and into the stomach. The endoscope has a light and camera on the end. Your caregiver uses the endoscope to view the inside of the stomach.     Taking a tissue sample (biopsy) from the stomach to examine under a microscope.     TREATMENT    Depending on the cause of your gastritis, medicines may be prescribed. If you have a bacterial infection, such as an H pylori infection, antibiotics may be given. If your gastritis is caused by too much acid in the stomach, H2 blockers or antacids may be given. Your caregiver may recommend that you stop taking aspirin, ibuprofen, or other nonsteroidal anti-inflammatory drugs (NSAIDs).    HOME CARE INSTRUCTIONS     Only take over-the-counter or prescription medicines as directed by your caregiver.     If you were given antibiotic medicines, take them as directed. Finish them even if you start to feel better.     Drink enough fluids to keep your urine clear or pale yellow.      Avoid foods and drinks that make your symptoms worse, such as:    ? Caffeine or alcoholic drinks.    ? Chocolate.    ? Peppermint or mint flavorings.    ? Garlic and onions.    ? Spicy foods.    ? Citrus fruits, such as oranges, lemons, or limes.    ? Tomato-based foods such as sauce, chili, salsa, and pizza.    ? Fried and fatty foods.     Eat small, frequent meals instead of large meals.    SEEK IMMEDIATE MEDICAL CARE IF:     You have black or dark red stools.     You vomit blood or material that looks like coffee grounds.  You are unable to keep fluids down.     Your abdominal pain gets worse.     You have a fever.     You do not feel better after 1 week.      You have any other questions or concerns.    MAKE SURE YOU:     Understand these instructions.     Will watch your condition.     Will get help right away if you are not doing well or get worse.    This information is not intended to replace advice given to you by your health care provider. Make sure you discuss any questions you have  with your health care provider.    Document Released: 05/02/2001 Document Revised: 11/07/2011 Document Reviewed: 06/21/2011  Elsevier Interactive Patient Education ?2016 Elsevier Inc.         Allergy Info: No Known Allergies     Medication Information:  [facility] ED Physicians provided you with a complete list of medications post discharge, if you have been instructed to stop taking a medication please ensure you also follow up with this information to your Primary Care Physician.  Unless otherwise noted, patient will continue to take medications as prescribed prior to the Emergency Room visit.  Any specific questions regarding your chronic medications and dosages should be discussed with your physician(s) and pharmacist.          butalbital/acetaminophen/caffeine (Fioricet oral tablet) 1 Tabs Oral (given by mouth) every 4 hours as needed headache. Refills: 0.  cephalexin (Keflex 500 mg oral capsule) 1 Capsules Oral (given by mouth) 4 times a day for 3 Days. Refills: 0.  famotidine (Pepcid 20 mg oral tablet) 1 Tabs Oral (given by mouth) 2 times a day. Refills: 0.  metoprolol (metoprolol tartrate 50 mg oral tablet) 1.5 Tabs Oral (given by mouth) 2 times a day.  norethindrone (Errin 0.35 mg oral tablet) 1 Tabs Oral (given by mouth) every day.  oxybutynin (oxybutynin 10 mg/24 hr oral tablet, extended release) Oral (given by mouth) every day.      Medications Administered During Visit:              Medication Dose Route   famotidine 20 mg IV Push   diphenhydrAMINE 25 mg IV Push   metoclopramide 10 mg IV Push          Major Tests and Procedures:  The following procedures and tests were performed during your ED visit.  COMMON PROCEDURES%>  COMMON PROCEDURES COMMENTS%>          Laboratory Orders  Name Status Details   .Preg U POC Completed Urine, RT, RT - Routine, Collected, 06/13/19 19:32:00 EST, Nurse collect, 06/13/19 19:32:00 US Robinette, RAL POC Login   .UA POC Completed Urine, RT, RT - Routine, Collected,  06/13/19 19:33:00 EST, Nurse collect, 06/13/19 19:33:00 US Robinette, RAL POC Login   CBCDIFF Completed Blood, Stat, ST - Stat, 06/13/19 19:14:00 EST, 06/13/19 19:14:00 EST, Nurse collect, ESCARZA-MD,  ROBERT H, Print label Y/N   CMP Completed Blood, Stat, ST - Stat, 06/13/19 19:14:00 EST, 06/13/19 19:14:00 EST, Nurse collect, ESCARZA-MD,  ROBERT H, Print label Y/N   Lipase Lvl Completed Blood, Stat, ST - Stat, 06/13/19 19:14:00 EST, 06/13/19 19:14:00 EST, Nurse collect, ESCARZA-MD,  ROBERT H, Print label Y/N   XTube Blue Completed Blood, ST, ST - Stat, Collected, 06/13/19 19:19:00 EST TERRNI, 06/13/19 19:19:00 EST, Venous Draw, 06/13/19 19:41:00 EST, RH CP Login, DUFFVE, Print label Y/N, [email or web address], Complete   XTube PST Completed  Blood, ST, ST - Stat, Collected, 06/13/19 19:19:00 EST TERRNI, 06/13/19 19:19:00 EST, Venous Draw, 06/13/19 19:41:00 EST, RH CP Login, DUFFVE, Print label Y/N, [email or web address], Complete               Radiology Orders  No radiology orders were placed.              Patient Care Orders  Name Status Details   Discharge Patient Ordered 06/13/19 20:15:00 EST   ED Assessment Adult Completed 06/13/19 18:05:59 EST, 06/13/19 18:05:59 EST   ED Secondary Triage Completed 06/13/19 18:05:59 EST, 06/13/19 18:05:59 EST   ED Triage Adult Completed 06/13/19 17:19:58 EST, 06/13/19 17:19:58 EST   POC-Urine Pregnancy Test collect Completed 06/13/19 19:14:00 EST, Once, 06/13/19 19:14:00 EST   Saline Lock Insert Completed 06/13/19 19:14:00 EST, Once, 06/13/19 19:14:00 EST       ---------------------------------------------------------------------------------------------------------------------  [facility]) encourages you to self-enroll in the [facility] Patient Portal.  [facility] Patient Portal will allow you to manage your personal health information securely from your own electronic device now and in the future.  To begin your Patient Portal enrollment process, please  visit [email or web address] Click on "Sign up now" under [facility].  If you find that you need additional assistance on the [facility] Patient Portal or need a copy of your medical records, please call the [facility] Medical Records Office at [phone].  Comment:

## 2019-06-13 NOTE — Discharge Summary (Signed)
 ED Clinical Summary                        [facility]  [address]  [place], [place] [address]  [phone]           PERSON INFORMATION  Name: Sandra Li, Sandra Li Age:  29 Years DOB: 06/24/1990   Sex: Female Language: English PCP: [name]   Marital Status: Unknown Phone: [phone] Med Service: [facility]   MRN: [id number] Acct# [id number] Arrival: 06/13/2019 17:19:00   Visit Reason: Headache; Abdominal pain; HEADACHE Acuity: 3 LOS: 000 03:07   Address:    [address] [place] [address]   Diagnosis:    Gastritis; Intractable headache  Medications:          New Medications  Printed Prescriptions  butalbital/acetaminophen/caffeine (Fioricet oral tablet) 1 Tabs Oral (given by mouth) every 4 hours as needed headache. Refills: 0.  Last Dose:____________________  cephalexin (Keflex 500 mg oral capsule) 1 Capsules Oral (given by mouth) 4 times a day for 3 Days. Refills: 0.  Last Dose:____________________  famotidine (Pepcid 20 mg oral tablet) 1 Tabs Oral (given by mouth) 2 times a day. Refills: 0.  Last Dose:____________________  Medications that have not changed  Other Medications  metoprolol (metoprolol tartrate 50 mg oral tablet) 1.5 Tabs Oral (given by mouth) 2 times a day.  Last Dose:____________________  norethindrone (Errin 0.35 mg oral tablet) 1 Tabs Oral (given by mouth) every day.  Last Dose:____________________  oxybutynin (oxybutynin 10 mg/24 hr oral tablet, extended release) Oral (given by mouth) every day.  Last Dose:____________________      Medications Administered During Visit:                Medication Dose Route   famotidine 20 mg IV Push   diphenhydrAMINE 25 mg IV Push   metoclopramide 10 mg IV Push               Allergies      No Known Allergies      Major Tests and Procedures:  The following procedures and tests were performed during your ED visit.  COMMON PROCEDURES%>  COMMON PROCEDURES COMMENTS%>                PROVIDER INFORMATION                Provider Role Assigned Sampson Lay, RN, [facility] ED Nurse 06/13/2019 19:07:15    MAYME LAMAR DEL ED Provider 06/13/2019 19:07:58        Attending Physician:  MAYME LAMAR DEL      Admit Doc  ESCARZA-MD,  ROBERT H     Consulting Doc       VITALS INFORMATION  Vital Sign Triage Latest   Temp Oral ORAL_1%> ORAL%>   Temp Temporal TEMPORAL_1%> TEMPORAL%>   Temp Intravascular INTRAVASCULAR_1%> INTRAVASCULAR%>   Temp Axillary AXILLARY_1%> AXILLARY%>   Temp Rectal RECTAL_1%> RECTAL%>   02 Sat 99 % 99 %   Respiratory Rate RATE_1%> RATE%>   Peripheral Pulse Rate PULSE RATE_1%> PULSE RATE%>   Apical Heart Rate HEART RATE_1%> HEART RATE%>   Blood Pressure BLOOD PRESSURE_1%>/ BLOOD PRESSURE_1%>80 mmHg BLOOD PRESSURE%> / BLOOD PRESSURE%>84 mmHg                 Immunizations      No Immunizations Documented This Visit          DISCHARGE INFORMATION   Discharge Disposition:  [facility] Out[facility]-Sent Home   Discharge Location:  Home   Discharge Date and Time:  06/13/2019 20:26:45   ED Checkout Date and Time:  06/13/2019 20:26:45     DEPART REASON INCOMPLETE INFORMATION               Depart Action Incomplete Reason   Interactive View/I&O Recently assessed               Problems      No Problems Documented              Smoking Status      4 or less cigarettes(less than 1/4 pack)/day in last 30 days         PATIENT EDUCATION INFORMATION  Instructions:     Additional Instructions (Custom); Urinary Tract Infection, Adult, Easy-to-Read; Recurrent Migraine Headache; Gastritis, Adult     Follow up:                   With: Address: When:   Follow up with primary care provider  Within 5 to 7 days   Comments:   Return to ED if symptoms worsen              ED PROVIDER DOCUMENTATION     Patient:   Sandra Li, Sandra Li            MRN: [id number]            FIN: [id number]               Age:   29 years     Sex:  Female     DOB:  [birth year]   Associated Diagnoses:   Intractable headache; Gastritis   Author:   MAYME LAMAR VEAR      Basic Information    Time seen: Provider Seen (ST)   ED Provider/Time:    ESCARZA-MD,  ROBERT H / 06/13/2019 19:07  .   Additional information: Chief Complaint from Nursing Triage Note   Chief Complaint  Chief Complaint: pt c/o frontal headache x 1 month and generalized abd pain x 2 weeks. c/o extreme lethargy for several weeks (06/13/19 18:00:00).      History of Present Illness   29 year old female presents with multiple vague complaints.  Her primary issue seems to be chronic, throbbing frontal headaches.  She has had this problem off and on for a while and then her own physician started her on metoprolol after which they seem to resolve for a while but have returned over the last several weeks.  She takes significant amounts of both aspirin and Goody's powder and she states now she is getting some postprandial nausea and abdominal discomfort in the epigastrium that is consistent in pattern with either medication or oral intake.  No fevers or active vomiting.  No diarrhea or melena.  Menstrual cycles have been suppressed by her daily oral contraceptives.  No urinary complaints, vaginal bleeding or vaginal discharge.      Review of Systems             Additional review of systems information: All other systems reviewed and otherwise negative.      Health Status   Allergies:    Allergic Reactions (All)  No Known Allergies.   Medications:  (Selected)   Documented Medications  Documented  Errin 0.35 mg oral tablet: 0.35 mg, 1 tabs, Oral, Daily, 28 tabs, 0 Refill(s)  metoprolol tartrate 50 mg oral tablet: 75 mg, 1.5 tabs, Oral, BID, 0 Refill(s)  oxybutynin 10 mg/24 hr oral tablet, extended release: mg, tabs, Oral, Daily, 0 Refill(s).      Past Medical/ Family/ Social History   Surgical history: Reviewed as documented in chart.   Family history: Reviewed as documented in chart.   Social history: Reviewed as documented in chart.   Problem list:    Active Problems (1)  HTN (hypertension)   , per nurse's notes.      Physical Examination                Vital Signs   Vital Signs   06/13/2019 19:06 EST Systolic Blood Pressure 129 mmHg    Diastolic Blood Pressure 107 mmHg  >HHI    Heart Rate Monitored 66 bpm    SpO2 98 %    SBP/DBP Cuff Details Left arm   06/13/2019 19:02 EST Temperature Oral 37 degC   06/13/2019 18:00 EST Systolic Blood Pressure 132 mmHg    Diastolic Blood Pressure 80 mmHg    Temperature Oral 37 degC    Heart Rate Monitored 70 bpm    Respiratory Rate 18 br/min    SpO2 99 %   .   Measurements   06/13/2019 18:06 EST Body Mass Index est meas 22.22 kg/m2    Body Mass Index Measured 22.22 kg/m2   06/13/2019 18:00 EST Height/Length Measured 150 cm    Weight Dosing 50 kg   .   Basic Oxygen Information   06/13/2019 19:06 EST SpO2 98 %   06/13/2019 18:00 EST Oxygen Therapy Room air    SpO2 99 %   .   Vital signs noted above  Gen.: Alert, no signs of toxicity  HEENT: Pupils equal and reactive, extraocular movements intact, no scleral icterus, mucous membranes moist, oropharynx clear  Neck: Supple, no nuchal rigidity, no thyromegaly  Chest: No tenderness or crepitus  Lungs: Clear to auscultation, no distress, oxygen saturation normal  Heart: Regular rate and rhythm, no audible murmur, no carotid bruit, no jugular venous distention  Abdomen: Soft, nondistended, nontender, no palpable masses.  Skin: Warm, dry and no rashes or icterus  Musculoskeletal: No swelling or deformity. No tenderness to palpation. Symmetric peripheral pulses. No edema.  Neurologic: Alert, normal gait, no focal motor or sensory deficits, no cranial nerve deficits, no dysmetria      Medical Decision Making   Results review:  Lab results : Lab View   06/13/2019 20:07 EST Estimated Creatinine Clearance 82.57 mL/min   06/13/2019 19:33 EST Appear U POC Clear    Color U POC Yellow    Bili U POC Negative    Blood U POC Negative    Glucose U POC Negative mg/dL    Ketones U POC Negative mg/dL    Leuk Est U POC Trace    Nitrite U POC Positive    pH U POC 7.5    Protein U POC Negative    Spec Grav  U POC 1.025    Urobilin U POC 0.2 EU/dL   8/77/7978 80:67 EST Preg U POC Negative   06/13/2019 19:19 EST WBC 7.6 x10e3/mcL    RBC 4.46 x10e6/mcL    Hgb 14.4 g/dL    HCT 58.2 %    MCV 06.4 fL    MCH 32.3 pg    MCHC 34.5 g/dL    RDW 88.0 %    Platelet 240 x10e3/mcL    MPV 9.5 fL    Neutro Auto 61.2 %    Neutro Absolute 4.6 x10e3/mcL    Immature  Grans Percent 0.1 %    Immature Grans Absolute 0.01 x10e3/mcL    Lymph Auto 27.2 %    Lymph Absolute 2.1 x10e3/mcL    Mono Auto 9.0 %    Mono Absolute 0.7 x10e3/mcL    Eosinophil Percent 1.7 %    Eos Absolute 0.1 x10e3/mcL    Basophil Auto 0.8 %    Baso Absolute 0.1 x10e3/mcL    NRBC Absolute Auto 0.000 x10e3/mcL    NRBC Percent Auto 0.0 %    Sodium Lvl 138 mmol/L    Potassium Lvl 4.6 mmol/L    Chloride 103 mmol/L    CO2 26 mmol/L    Glucose Random 91 mg/dL    BUN 14 mg/dL    Creatinine Lvl 0.7 mg/dL    AGAP 9 mmol/L    Osmolality Calc 276 mOsm/kg    Calcium Lvl 9.5 mg/dL    Protein Total 6.9 g/dL    Albumin Lvl 4.7 g/dL    Globulin Calc 2.2 g/dL    AG Ratio Calc 7.85 mmol/L  HI    Alk Phos 43 unit/L    AST 23 unit/L    ALT 18 unit/L    eGFR AA 138 mL/min/1.76m    eGFR Non-AA 119 mL/min/1.72m    Bili Total 0.30 mg/dL    Lipase Lvl 19 unit/L   06/13/2019 18:05 EST Estimated Creatinine Clearance 96.33 mL/min   .      Reexamination/ Reevaluation   Vital signs   Basic Oxygen Information   06/13/2019 19:06 EST SpO2 98 %   06/13/2019 18:00 EST Oxygen Therapy Room air    SpO2 99 %      06/13/2019 20:18:17: Headache resolved.  Diagnostic studies unremarkable.  Patient stable for outpatient management.  Placed on a stomach protective regimen in addition to a nonaspirin containing medication for breakthrough headaches.  She will continue her other home medicines.  I had a discussion about her urine test.  The patient does not exhibit classic symptoms of a urinary tract infection, so I do not feel she needs to start antibiotics right away but should she start experiencing symptoms more  concerning for urinary tract infection, she has a 3-day course of antibiotics to take.  Discharged in good condition.      Impression and Plan   Diagnosis   Intractable headache (ICD10-CM R51.9, Discharge, Medical)   Gastritis (ICD10-CM K29.70, Discharge, Medical)   Plan   Condition: Improved, Stable.    Disposition: Discharged: to home.    Patient was given the following educational materials: Additional Instructions (Custom), Urinary Tract Infection, Adult, Easy-to-Read, Recurrent Migraine Headache, Gastritis, Adult.    Follow up with: Follow up with primary care provider Within 5 to 7 days Return to ED if symptoms worsen.    Counseled: Patient, Regarding diagnosis, Regarding diagnostic results, Regarding treatment plan, Regarding prescription, Patient indicated understanding of instructions.

## 2019-06-13 NOTE — ED Provider Notes (Signed)
Abdominal pain        Patient:   Sandra Li, Sandra Li            MRN: [id number]            FIN: [id number]               Age:   29 years     Sex:  Female     DOB:  03-04-1991   Associated Diagnoses:   Intractable headache; Gastritis   Author:   Rozanna Box      Basic Information   Time seen: Provider Seen (ST)   ED Provider/Time:    Wallice Granville-MD,  Tiah Heckel H / 06/13/2019 19:07  .   Additional information: Chief Complaint from Nursing Triage Note   Chief Complaint  Chief Complaint: pt c/o frontal headache x 1 month and generalized abd pain x 2 weeks. c/o extreme lethargy for several weeks (06/13/19 18:00:00).      History of Present Illness   29 year old female presents with multiple vague complaints.  Her primary issue seems to be chronic, throbbing frontal headaches.  She has had this problem off and on for a while and then her own physician started her on metoprolol after which they seem to resolve for a while but have returned over the last several weeks.  She takes significant amounts of both aspirin and Goody's powder and she states now she is getting some postprandial nausea and abdominal discomfort in the epigastrium that is consistent in pattern with either medication or oral intake.  No fevers or active vomiting.  No diarrhea or melena.  Menstrual cycles have been suppressed by her daily oral contraceptives.  No urinary complaints, vaginal bleeding or vaginal discharge.      Review of Systems             Additional review of systems information: All other systems reviewed and otherwise negative.      Health Status   Allergies:    Allergic Reactions (All)  No Known Allergies.   Medications:  (Selected)   Documented Medications  Documented  Errin 0.35 mg oral tablet: 0.35 mg, 1 tabs, Oral, Daily, 28 tabs, 0 Refill(s)  metoprolol tartrate 50 mg oral tablet: 75 mg, 1.5 tabs, Oral, BID, 0 Refill(s)  oxybutynin 10 mg/24 hr oral tablet, extended release: mg, tabs, Oral, Daily, 0 Refill(s).      Past Medical/ Family/  Social History   Surgical history: Reviewed as documented in chart.   Family history: Reviewed as documented in chart.   Social history: Reviewed as documented in chart.   Problem list:    Active Problems (1)  HTN (hypertension)   , per nurse's notes.      Physical Examination               Vital Signs   Vital Signs   3/00/9233 00:76 EST Systolic Blood Pressure 226 mmHg    Diastolic Blood Pressure 333 mmHg  >HHI    Heart Rate Monitored 66 bpm    SpO2 98 %    SBP/DBP Cuff Details Left arm   06/13/2019 19:02 EST Temperature Oral 37 degC   5/45/6256 38:93 EST Systolic Blood Pressure 734 mmHg    Diastolic Blood Pressure 80 mmHg    Temperature Oral 37 degC    Heart Rate Monitored 70 bpm    Respiratory Rate 18 br/min    SpO2 99 %   .   Measurements   06/13/2019 18:06 EST Body Mass  Index est meas 22.22 kg/m2    Body Mass Index Measured 22.22 kg/m2   06/13/2019 18:00 EST Height/Length Measured 150 cm    Weight Dosing 50 kg   .   Basic Oxygen Information   06/13/2019 19:06 EST SpO2 98 %   06/13/2019 18:00 EST Oxygen Therapy Room air    SpO2 99 %   .   Vital signs noted above  Gen.: Alert, no signs of toxicity  HEENT: Pupils equal and reactive, extraocular movements intact, no scleral icterus, mucous membranes moist, oropharynx clear  Neck: Supple, no nuchal rigidity, no thyromegaly  Chest: No tenderness or crepitus  Lungs: Clear to auscultation, no distress, oxygen saturation normal  Heart: Regular rate and rhythm, no audible murmur, no carotid bruit, no jugular venous distention  Abdomen: Soft, nondistended, nontender, no palpable masses.  Skin: Warm, dry and no rashes or icterus  Musculoskeletal: No swelling or deformity. No tenderness to palpation. Symmetric peripheral pulses. No edema.  Neurologic: Alert, normal gait, no focal motor or sensory deficits, no cranial nerve deficits, no dysmetria      Medical Decision Making   Results review:  Lab results : Lab View   06/13/2019 20:07 EST Estimated Creatinine Clearance 82.57  mL/min   06/13/2019 19:33 EST Appear U POC Clear    Color U POC Yellow    Bili U POC Negative    Blood U POC Negative    Glucose U POC Negative mg/dL    Ketones U POC Negative mg/dL    Leuk Est U POC Trace    Nitrite U POC Positive    pH U POC 7.5    Protein U POC Negative    Spec Grav U POC 1.025    Urobilin U POC 0.2 EU/dL   06/13/2019 19:32 EST Preg U POC Negative   06/13/2019 19:19 EST WBC 7.6 x10e3/mcL    RBC 4.46 x10e6/mcL    Hgb 14.4 g/dL    HCT 41.7 %    MCV 93.5 fL    MCH 32.3 pg    MCHC 34.5 g/dL    RDW 11.9 %    Platelet 240 x10e3/mcL    MPV 9.5 fL    Neutro Auto 61.2 %    Neutro Absolute 4.6 x10e3/mcL    Immature Grans Percent 0.1 %    Immature Grans Absolute 0.01 x10e3/mcL    Lymph Auto 27.2 %    Lymph Absolute 2.1 x10e3/mcL    Mono Auto 9.0 %    Mono Absolute 0.7 x10e3/mcL    Eosinophil Percent 1.7 %    Eos Absolute 0.1 x10e3/mcL    Basophil Auto 0.8 %    Baso Absolute 0.1 x10e3/mcL    NRBC Absolute Auto 0.000 x10e3/mcL    NRBC Percent Auto 0.0 %    Sodium Lvl 138 mmol/L    Potassium Lvl 4.6 mmol/L    Chloride 103 mmol/L    CO2 26 mmol/L    Glucose Random 91 mg/dL    BUN 14 mg/dL    Creatinine Lvl 0.7 mg/dL    AGAP 9 mmol/L    Osmolality Calc 276 mOsm/kg    Calcium Lvl 9.5 mg/dL    Protein Total 6.9 g/dL    Albumin Lvl 4.7 g/dL    Globulin Calc 2.2 g/dL    AG Ratio Calc 2.14 mmol/L  HI    Alk Phos 43 unit/L    AST 23 unit/L    ALT 18 unit/L    eGFR AA 138 mL/min/1.22m???  eGFR Non-AA 119 mL/min/1.73m???    Bili Total 0.30 mg/dL    Lipase Lvl 19 unit/L   06/13/2019 18:05 EST Estimated Creatinine Clearance 96.33 mL/min   .      Reexamination/ Reevaluation   Vital signs   Basic Oxygen Information   06/13/2019 19:06 EST SpO2 98 %   06/13/2019 18:00 EST Oxygen Therapy Room air    SpO2 99 %      06/13/2019 20:18:17: Headache resolved.  Diagnostic studies unremarkable.  Patient stable for outpatient management.  Placed on a stomach protective regimen in addition to a nonaspirin containing medication for breakthrough  headaches.  She will continue her other home medicines.  I had a discussion about her urine test.  The patient does not exhibit classic symptoms of a urinary tract infection, so I do not feel she needs to start antibiotics right away but should she start experiencing symptoms more concerning for urinary tract infection, she has a 3-day course of antibiotics to take.  Discharged in good condition.      Impression and Plan   Diagnosis   Intractable headache (ICD10-CM R51.9, Discharge, Medical)   Gastritis (ICD10-CM K29.70, Discharge, Medical)   Plan   Condition: Improved, Stable.    Disposition: Discharged: to home.    Patient was given the following educational materials: Additional Instructions (Custom), Urinary Tract Infection, Adult, Easy-to-Read, Recurrent Migraine Headache, Gastritis, Adult.    Follow up with: Follow up with primary care provider Within 5 to 7 days Return to ED if symptoms worsen.    Counseled: Patient, Regarding diagnosis, Regarding diagnostic results, Regarding treatment plan, Regarding prescription, Patient indicated understanding of instructions.    Signature Line     Electronically Signed on 06/13/2019 08:19 PM EST   ________________________________________________   EAltamese CarolinaH               Modified by: ERozanna Boxon 06/13/2019 08:19 PM EST

## 2019-06-13 NOTE — ED Notes (Signed)
ED Triage Note       ED Triage Adult Entered On:  06/13/2019 18:05 EST    Performed On:  06/13/2019 18:00 EST by Ladona Ridgel, RN, JENNIFER S               Triage   Chief Complaint :   pt c/o frontal headache x 1 month and generalized abd pain x 2 weeks. c/o extreme lethargy for several weeks   Numeric Rating Pain Scale :   0 = No pain   [place] Mode of Arrival :   Private vehicle   Infectious Disease Documentation :   Document assessment   Temperature Oral :   37 degC(Converted to: 98.6 degF)    Heart Rate Monitored :   70 bpm   Respiratory Rate :   18 br/min   Systolic Blood Pressure :   132 mmHg   Diastolic Blood Pressure :   80 mmHg   SpO2 :   99 %   Oxygen Therapy :   Room air   Patient presentation :   None of the above   Chief Complaint or Presentation suggest infection :   No   Weight Dosing :   50 kg(Converted to: 110 lb 4 oz)    Height :   150 cm(Converted to: 4 ft 11 in)    Body Mass Index Dosing :   22 kg/m2   Ladona Ridgel RN, [place] S - 06/13/2019 18:00 EST   DCP GENERIC CODE   Tracking Acuity :   3   Tracking Group :   ED [employer], RN, [employer] S - 06/13/2019 18:00 EST   ED General Section :   Document assessment   Pregnancy Status :   Patient denies   ED Allergies Section :   Document assessment   ED Reason for Visit Section :   Document assessment   Ladona Ridgel, RN, JENNIFER S - 06/13/2019 18:00 EST   ID Risk Screen Symptoms   Recent Travel History :   No recent travel   Close Contact with COVID-19 ID :   No   Last 14 days COVID-19 ID :   No   TAYLOR, RN, JENNIFER S - 06/13/2019 18:00 EST   Allergies   (As Of: 06/13/2019 18:05:58 EST)   Allergies (Active)   No Known Allergies  Estimated Onset Date:   Unspecified ; Created By:   Ladona Ridgel, RN, Victorino Dike S; Reaction Status:   Active ; Category:   Drug ; Substance:   No Known Allergies ; Type:   Allergy ; Updated By:   Laruth Bouchard; Reviewed Date:   06/13/2019 18:04 EST        Psycho-Social   Last 3 mo, thoughts killing self/others :   Patient  denies   Laruth Bouchard - 06/13/2019 18:00 EST   ED Reason for Visit   (As Of: 06/13/2019 18:05:58 EST)   Problems(Active)    HTN (hypertension) (SNOMED CT  :2355732202 )  Name of Problem:   HTN (hypertension) ; Recorder:   Ladona Ridgel, RN, JENNIFER S; Confirmation:   Confirmed ; Classification:   Patient Stated ; Code:   [id number] ; Contributor System:   Dietitian ; Last Updated:   06/13/2019 18:04 EST ; Life Cycle Date:   06/13/2019 ; Life Cycle Status:   Active ; Vocabulary:   SNOMED CT          Diagnoses(Active)    Abdominal pain  Date:  06/13/2019 ; Diagnosis Type:   Reason For Visit ; Confirmation:   Complaint of ; Clinical Dx:   Abdominal pain ; Classification:   Medical ; Clinical Service:   Emergency medicine ; Code:   PNED ; Probability:   0 ; Diagnosis Code:   [id number]      Headache  Date:   06/13/2019 ; Diagnosis Type:   Reason For Visit ; Confirmation:   Complaint of ; Clinical Dx:   Headache ; Classification:   Medical ; Clinical Service:   Emergency medicine ; Code:   PNED ; Probability:   0 ; Diagnosis Code:   [id number]

## 2019-06-13 NOTE — ED Notes (Signed)
ED Triage Note       ED Secondary Triage Entered On:  06/13/2019 19:02 EST    Performed On:  06/13/2019 18:58 EST by Enid Derry, RN, Nichole               General Information   Barriers to Learning :   Acuity of Illness   Pt. Currently Receiving Radiation :   No   ED Home Meds Section :   Document assessment   [facility] ED Fall Risk Section :   Document assessment   ED History Section :   Document assessment   ED Advance Directives Section :   Document assessment   ED Palliative Screen :   N/A (prefilled for <65yo)   Enid Derry RN, Darden Amber - 06/13/2019 18:58 EST   (As Of: 06/13/2019 19:02:54 EST)   Problems(Active)    HTN (hypertension) (SNOMED CT  :4401027253 )  Name of Problem:   HTN (hypertension) ; Recorder:   Lovena Le, RN, JENNIFER S; Confirmation:   Confirmed ; Classification:   Patient Stated ; Code:   [id number] ; Contributor System:   Conservation officer, nature ; Last Updated:   06/13/2019 18:04 EST ; Life Cycle Date:   06/13/2019 ; Life Cycle Status:   Active ; Vocabulary:   SNOMED CT          Diagnoses(Active)    Abdominal pain  Date:   06/13/2019 ; Diagnosis Type:   Reason For Visit ; Confirmation:   Complaint of ; Clinical Dx:   Abdominal pain ; Classification:   Medical ; Clinical Service:   Emergency medicine ; Code:   PNED ; Probability:   0 ; Diagnosis Code:   [id number]      Headache  Date:   06/13/2019 ; Diagnosis Type:   Reason For Visit ; Confirmation:   Complaint of ; Clinical Dx:   Headache ; Classification:   Medical ; Clinical Service:   Emergency medicine ; Code:   PNED ; Probability:   0 ; Diagnosis Code:   [id number]             -    Procedure History   (As Of: 06/13/2019 19:02:54 EST)     Lennette Bihari Fall Risk Assessment Tool   Hx of falling last 3 months ED Fall :   No   Patient confused or disoriented ED Fall :   No   Patient intoxicated or sedated ED Fall :   No   Patient impaired gait ED Fall :   No   Use a mobility assistance device ED Fall :   No   Patient altered  elimination ED Fall :   No   [facility] ED Fall Score :   0    Trilby Drummer - 06/13/2019 18:58 EST   ED Advance Directive   Advance Directive :   No   Enid Derry RN, Nichole - 06/13/2019 18:58 EST   Social History   Social History   (As Of: 06/13/2019 19:02:54 EST)   Tobacco:        Tobacco use: 4 or less cigarettes(less than 1/4 pack)/day in last 30 days.  Cigarettes, Electronic nicotine delivery system (vaping)   (Last Updated: 06/13/2019 19:02:34 EST by Enid Derry, RN, Nichole)          Alcohol:        Liquor, 3-5 times per week   (Last Updated: 06/13/2019 19:02:34 EST by Enid Derry, RN, Nichole)          Substance Use:  Denies   (Last Updated: 06/13/2019 19:02:34 EST by Landry Mellow, RN, Nichole)            Med Hx   Medication List   (As Of: 06/13/2019 19:02:54 EST)   Home Meds      Status:   Processing ; Ordered As Mnemonic:   oxybutynin 10 mg/24 hr oral tablet, extended release ; Simple Display Line:   mg, tabs, Oral, Daily, 0 Refill(s) ; Action Display:   Document ; Catalog Code:   oxybutynin ; Order Dt/Tm:   06/13/2019 19:01:23 EST            Status:   Processing ; Ordered As Mnemonic:   metoprolol tartrate 50 mg oral tablet ; Simple Display Line:   75 mg, 1.5 tabs, Oral, BID, 0 Refill(s) ; Action Display:   Document ; Catalog Code:   metoprolol ; Order Dt/Tm:   06/13/2019 19:00:35 EST            Status:   Processing ; Ordered As Mnemonic:   Errin 0.35 mg oral tablet ; Simple Display Line:   0.35 mg, 1 tabs, Oral, Daily, 28 tabs, 0 Refill(s) ; Action Display:   Document ; Catalog Code:   norethindrone ; Order Dt/Tm:   06/13/2019 19:00:57 EST

## 2019-06-13 NOTE — ED Notes (Signed)
ED Patient Education Note     ;Patient Education Materials Follows:           Additional Instructions    We discussed the findings on your urine testing today.  Do not start antibiotics right away but should you develop more classic symptoms of a urinary tract infection, you have a prescription to take for 3 days.  Return here at anytime with concerns of worsening condition.      -         Easy-to-Read     Urinary Tract Infection    A urinary tract infection (UTI) can occur any place along the urinary tract. The tract includes the kidneys, ureters, bladder, and urethra. A type of germ called bacteria often causes a UTI. UTIs are often helped with antibiotic medicine.       HOME CARE     If given, take antibiotics as told by your doctor. Finish them even if you start to feel better.     Drink enough fluids to keep your pee (urine) clear or pale yellow.     Avoid tea, drinks with caffeine, and bubbly (carbonated) drinks.     Pee often. Avoid holding your pee in for a long time.      Pee before and after having sex (intercourse).     Wipe from front to back after you poop (bowel movement) if you are a woman. Use each tissue only once.    GET HELP RIGHT AWAY IF:     You have back pain.     You have lower belly (abdominal) pain.     You have chills.     You feel sick to your stomach (nauseous).     You throw up (vomit).     Your burning or discomfort with peeing does not go away.     You have a fever.     Your symptoms are not better in 3 days.     MAKE SURE YOU:     Understand these instructions.     Will watch your condition.     Will get help right away if you are not doing well or get worse.    This information is not intended to replace advice given to you by your health care provider. Make sure you discuss any questions you have with your health care provider.    Document Released: 10/25/2007 Document Revised: 05/29/2014 Document Reviewed: 03/29/2015  Elsevier Interactive Patient Education ?2016 [employer].       Emergency Medicine     Recurrent Migraine Headache    A migraine headache is an intense, throbbing pain on one or both sides of your head. Recurrent migraines keep coming back. A migraine can last for 30 minutes to several hours.      CAUSES    The exact cause of a migraine headache is not always known. However, a migraine may be caused when nerves in the brain become irritated and release chemicals that cause inflammation. This causes pain.    Certain things may also trigger migraines, such as:      Alcohol.     Smoking.     Stress.      Menstruation.     Aged cheeses.     Foods or drinks that contain nitrates, glutamate, aspartame, or tyramine.      Lack of sleep.      Chocolate.      Caffeine.     Hunger.     Physical  exertion.     Fatigue.      Medicines used to treat chest pain (nitroglycerine), birth control pills, estrogen, and some blood pressure medicines.    SYMPTOMS     Pain on one or both sides of your head.     Pulsating or throbbing pain.     Severe pain that prevents daily activities.     Pain that is aggravated by any physical activity.     Nausea, vomiting, or both.      Dizziness.      Pain with exposure to bright lights, loud noises, or activity.     General sensitivity to bright lights, loud noises, or smells.    Before you get a migraine, you may get warning signs that a migraine is coming (aura). An aura may include:     Seeing flashing lights.     Seeing bright spots, halos, or zigzag lines.      Having tunnel vision or blurred vision.     Having feelings of numbness or tingling.      Having trouble talking.     Having muscle weakness.     DIAGNOSIS    A recurrent migraine headache is often diagnosed based on:     Symptoms.      Physical examination.     A CT scan or MRI of your head. These imaging tests cannot diagnose migraines but can help rule out other causes of headaches. ?    TREATMENT    Medicines may be given for pain and nausea. Medicines can also be given to help prevent recurrent  migraines.    HOME CARE INSTRUCTIONS     Only take over-the-counter or prescription medicines for pain or discomfort as directed by your health care provider. The use of long-term narcotics is not recommended.      Lie down in a dark, quiet room when you have a migraine.      Keep a journal to find out what may trigger your migraine headaches. For example, write down:    ? What you eat and drink.    ? How much sleep you get.    ? Any change to your diet or medicines.     Limit alcohol consumption.     Quit smoking if you smoke.      Get 7?9 hours of sleep, or as recommended by your health care provider.     Limit stress.      Keep lights dim if bright lights bother you and make your migraines worse.    SEEK MEDICAL CARE IF:     You do not get relief from the medicines given to you.      You have a recurrence of pain.      You have a fever.     SEEK IMMEDIATE MEDICAL CARE IF:     Your migraine becomes severe.     You have a stiff neck.     You have loss of vision.     You have muscular weakness or loss of muscle control.     You start losing your balance or have trouble walking.     You feel faint or pass out.     You have severe symptoms that are different from your first symptoms.     MAKE SURE YOU:     Understand these instructions.      Will watch your condition.     Will get help right away if you are not   doing well or get worse.    This information is not intended to replace advice given to you by your health care provider. Make sure you discuss any questions you have with your health care provider.    Document Released: 01/31/2001 Document Revised: 05/29/2014 Document Reviewed: 01/13/2013  Elsevier Interactive Patient Education ?2016 [employer] Inc[employer]         Gastritis, Adult    Gastritis is soreness and swelling (inflammation) of the lining of the stomach. Gastritis can develop as a sudden onset (acute) or long-term (chronic) condition. If gastritis is not treated, it can lead to stomach bleeding and ulcers.       CAUSES    Gastritis occurs when the stomach lining is weak or damaged. Digestive juices from the stomach then inflame the weakened stomach lining. The stomach lining may be weak or damaged due to viral or bacterial infections. One common bacterial infection is the Helicobacter pylori  infection. Gastritis can also result from excessive alcohol consumption, taking certain medicines, or having too much acid in the stomach.     SYMPTOMS    In some cases, there are no symptoms. When symptoms are present, they may include:     Pain or a burning sensation in the upper abdomen.     Nausea.     Vomiting.     An uncomfortable feeling of fullness after eating.     DIAGNOSIS    Your caregiver may suspect you have gastritis based on your symptoms and a physical exam. To determine the cause of your gastritis, your caregiver may perform the following:     Blood or stool tests to check for the H pylori bacterium.     Gastroscopy. A thin, flexible tube (endoscope) is passed down the esophagus and into the stomach. The endoscope has a light and camera on the end. Your caregiver uses the endoscope to view the inside of the stomach.     Taking a tissue sample (biopsy) from the stomach to examine under a microscope.     TREATMENT    Depending on the cause of your gastritis, medicines may be prescribed. If you have a bacterial infection, such as an H pylori infection, antibiotics may be given. If your gastritis is caused by too much acid in the stomach, H2 blockers or antacids may be given. Your caregiver may recommend that you stop taking aspirin, ibuprofen, or other nonsteroidal anti-inflammatory drugs (NSAIDs).    HOME CARE INSTRUCTIONS     Only take over-the-counter or prescription medicines as directed by your caregiver.     If you were given antibiotic medicines, take them as directed. Finish them even if you start to feel better.     Drink enough fluids to keep your urine clear or pale yellow.      Avoid foods and drinks that  make your symptoms worse, such as:    ? Caffeine or alcoholic drinks.    ? Chocolate.    ? Peppermint or mint flavorings.    ? Garlic and onions.    ? Spicy foods.    ? Citrus fruits, such as oranges, lemons, or limes.    ? Tomato-based foods such as sauce, chili, salsa, and pizza.    ? Fried and fatty foods.     Eat small, frequent meals instead of large meals.    SEEK IMMEDIATE MEDICAL CARE IF:     You have black or dark red stools.     You vomit blood or material that  looks like coffee grounds.     You are unable to keep fluids down.     Your abdominal pain gets worse.     You have a fever.     You do not feel better after 1 week.      You have any other questions or concerns.    MAKE SURE YOU:     Understand these instructions.     Will watch your condition.     Will get help right away if you are not doing well or get worse.    This information is not intended to replace advice given to you by your health care provider. Make sure you discuss any questions you have with your health care provider.    Document Released: 05/02/2001 Document Revised: 11/07/2011 Document Reviewed: 06/21/2011  Elsevier Interactive Patient Education ?2016 [employer]

## 2019-06-13 NOTE — ED Notes (Signed)
ED Patient Education Note     ;Patient Education Materials Follows:  Personal assistant Instructions    We discussed the findings on your urine testing today.  Do not start antibiotics right away but should you develop more classic symptoms of a urinary tract infection, you have a prescription to take for 3 days.  Return here at anytime with concerns of worsening condition.      -         Easy-to-Read     Urinary Tract Infection    A urinary tract infection (UTI) can occur any place along the urinary tract. The tract includes the kidneys, ureters, bladder, and urethra. A type of germ called bacteria often causes a UTI. UTIs are often helped with antibiotic medicine.       HOME CARE     If given, take antibiotics as told by your doctor. Finish them even if you start to feel better.     Drink enough fluids to keep your pee (urine) clear or pale yellow.     Avoid tea, drinks with caffeine, and bubbly (carbonated) drinks.     Pee often. Avoid holding your pee in for a long time.      Pee before and after having sex (intercourse).     Wipe from front to back after you poop (bowel movement) if you are a woman. Use each tissue only once.    GET HELP RIGHT AWAY IF:     You have back pain.     You have lower belly (abdominal) pain.     You have chills.     You feel sick to your stomach (nauseous).     You throw up (vomit).     Your burning or discomfort with peeing does not go away.     You have a fever.     Your symptoms are not better in 3 days.     MAKE SURE YOU:     Understand these instructions.     Will watch your condition.     Will get help right away if you are not doing well or get worse.    This information is not intended to replace advice given to you by your health care provider. Make sure you discuss any questions you have with your health care provider.    Document Released: 10/25/2007 Document Revised: 05/29/2014 Document Reviewed: 03/29/2015  Elsevier Interactive Patient Education ?2016 [employer]      Emergency Medicine     Gastritis, Adult    Gastritis is soreness and swelling (inflammation) of the lining of the stomach. Gastritis can develop as a sudden onset (acute) or long-term (chronic) condition. If gastritis is not treated, it can lead to stomach bleeding and ulcers.      CAUSES    Gastritis occurs when the stomach lining is weak or damaged. Digestive juices from the stomach then inflame the weakened stomach lining. The stomach lining may be weak or damaged due to viral or bacterial infections. One common bacterial infection is the Helicobacter pylori  infection. Gastritis can also result from excessive alcohol consumption, taking certain medicines, or having too much acid in the stomach.     SYMPTOMS    In some cases, there are no symptoms. When symptoms are present, they may include:     Pain or a burning sensation in the upper abdomen.     Nausea.     Vomiting.  An uncomfortable feeling of fullness after eating.     DIAGNOSIS    Your caregiver may suspect you have gastritis based on your symptoms and a physical exam. To determine the cause of your gastritis, your caregiver may perform the following:     Blood or stool tests to check for the H pylori bacterium.     Gastroscopy. A thin, flexible tube (endoscope) is passed down the esophagus and into the stomach. The endoscope has a light and camera on the end. Your caregiver uses the endoscope to view the inside of the stomach.     Taking a tissue sample (biopsy) from the stomach to examine under a microscope.     TREATMENT    Depending on the cause of your gastritis, medicines may be prescribed. If you have a bacterial infection, such as an H pylori infection, antibiotics may be given. If your gastritis is caused by too much acid in the stomach, H2 blockers or antacids may be given. Your caregiver may recommend that you stop taking aspirin, ibuprofen, or other nonsteroidal anti-inflammatory drugs (NSAIDs).    HOME CARE INSTRUCTIONS     Only  take over-the-counter or prescription medicines as directed by your caregiver.     If you were given antibiotic medicines, take them as directed. Finish them even if you start to feel better.     Drink enough fluids to keep your urine clear or pale yellow.      Avoid foods and drinks that make your symptoms worse, such as:    ? Caffeine or alcoholic drinks.    ? Chocolate.    ? Peppermint or mint flavorings.    ? Garlic and onions.    ? Spicy foods.    ? Citrus fruits, such as oranges, lemons, or limes.    ? Tomato-based foods such as sauce, chili, salsa, and pizza.    ? Fried and fatty foods.     Eat small, frequent meals instead of large meals.    SEEK IMMEDIATE MEDICAL CARE IF:     You have black or dark red stools.     You vomit blood or material that looks like coffee grounds.     You are unable to keep fluids down.     Your abdominal pain gets worse.     You have a fever.     You do not feel better after 1 week.      You have any other questions or concerns.    MAKE SURE YOU:     Understand these instructions.     Will watch your condition.     Will get help right away if you are not doing well or get worse.    This information is not intended to replace advice given to you by your health care provider. Make sure you discuss any questions you have with your health care provider.    Document Released: 05/02/2001 Document Revised: 11/07/2011 Document Reviewed: 06/21/2011  Elsevier Interactive Patient Education ?2016 [employer]         Recurrent Migraine Headache    A migraine headache is an intense, throbbing pain on one or both sides of your head. Recurrent migraines keep coming back. A migraine can last for 30 minutes to several hours.      CAUSES    The exact cause of a migraine headache is not always known. However, a migraine may be caused when nerves in the brain become irritated and release chemicals that cause  inflammation. This causes pain.    Certain things may also trigger migraines, such as:       Alcohol.     Smoking.     Stress.      Menstruation.     Aged cheeses.     Foods or drinks that contain nitrates, glutamate, aspartame, or tyramine.      Lack of sleep.      Chocolate.      Caffeine.     Hunger.     Physical exertion.     Fatigue.      Medicines used to treat chest pain (nitroglycerine), birth control pills, estrogen, and some blood pressure medicines.    SYMPTOMS     Pain on one or both sides of your head.     Pulsating or throbbing pain.     Severe pain that prevents daily activities.     Pain that is aggravated by any physical activity.     Nausea, vomiting, or both.      Dizziness.      Pain with exposure to bright lights, loud noises, or activity.     General sensitivity to bright lights, loud noises, or smells.    Before you get a migraine, you may get warning signs that a migraine is coming (aura). An aura may include:     Seeing flashing lights.     Seeing bright spots, halos, or zigzag lines.      Having tunnel vision or blurred vision.     Having feelings of numbness or tingling.      Having trouble talking.     Having muscle weakness.     DIAGNOSIS    A recurrent migraine headache is often diagnosed based on:     Symptoms.      Physical examination.     A CT scan or MRI of your head. These imaging tests cannot diagnose migraines but can help rule out other causes of headaches. ?    TREATMENT    Medicines may be given for pain and nausea. Medicines can also be given to help prevent recurrent migraines.    HOME CARE INSTRUCTIONS     Only take over-the-counter or prescription medicines for pain or discomfort as directed by your health care provider. The use of long-term narcotics is not recommended.      Lie down in a dark, quiet room when you have a migraine.      Keep a journal to find out what may trigger your migraine headaches. For example, write down:    ? What you eat and drink.    ? How much sleep you get.    ? Any change to your diet or medicines.     Limit alcohol consumption.      Quit smoking if you smoke.      Get 7?9 hours of sleep, or as recommended by your health care provider.     Limit stress.      Keep lights dim if bright lights bother you and make your migraines worse.    SEEK MEDICAL CARE IF:     You do not get relief from the medicines given to you.      You have a recurrence of pain.      You have a fever.     SEEK IMMEDIATE MEDICAL CARE IF:     Your migraine becomes severe.     You have a stiff neck.     You have loss of  vision.     You have muscular weakness or loss of muscle control.     You start losing your balance or have trouble walking.     You feel faint or pass out.     You have severe symptoms that are different from your first symptoms.     MAKE SURE YOU:     Understand these instructions.      Will watch your condition.     Will get help right away if you are not doing well or get worse.    This information is not intended to replace advice given to you by your health care provider. Make sure you discuss any questions you have with your health care provider.    Document Released: 01/31/2001 Document Revised: 05/29/2014 Document Reviewed: 01/13/2013  Elsevier Interactive Patient Education ?2016 [employer]

## 2019-06-14 LAB — POC URINALYSIS, CHEMISTRY
Bilirubin, Urine, POC: NEGATIVE
Blood, UA POC: NEGATIVE
Glucose, UA POC: NEGATIVE mg/dL
Ketones, Urine, POC: NEGATIVE mg/dL
Nitrate, UA POC: POSITIVE — AB
Protein, Urine, POC: NEGATIVE
Specific Gravity, Urine, POC: 1.025 (ref 1.003–1.035)
UROBILIN U POC: 0.2 EU/dL
pH, Urine, POC: 7.5 (ref 4.5–8.0)

## 2019-06-14 LAB — CBC WITH AUTO DIFFERENTIAL
Absolute Baso #: 0.1 10*3/uL (ref 0.0–0.2)
Absolute Eos #: 0.1 10*3/uL (ref 0.0–0.5)
Absolute Lymph #: 2.1 10*3/uL (ref 1.0–3.2)
Absolute Mono #: 0.7 10*3/uL (ref 0.3–1.0)
Basophils %: 0.8 % (ref 0.0–2.0)
Eosinophils %: 1.7 % (ref 0.0–7.0)
Hematocrit: 41.7 % (ref 34.0–47.0)
Hemoglobin: 14.4 g/dL (ref 11.5–15.7)
Immature Grans (Abs): 0.01 10*3/uL (ref 0.00–0.06)
Immature Granulocytes: 0.1 % (ref 0.1–0.6)
Lymphocytes: 27.2 % (ref 15.0–45.0)
MCH: 32.3 pg (ref 27.0–34.5)
MCHC: 34.5 g/dL (ref 32.0–36.0)
MCV: 93.5 fL (ref 81.0–99.0)
MPV: 9.5 fL (ref 7.2–13.2)
Monocytes: 9 % (ref 4.0–12.0)
NRBC Absolute: 0 10*3/uL (ref 0.000–0.012)
NRBC Automated: 0 % (ref 0.0–0.2)
Neutrophils %: 61.2 % (ref 42.0–74.0)
Neutrophils Absolute: 4.6 10*3/uL (ref 1.6–7.3)
Platelets: 240 10*3/uL (ref 140–440)
RBC: 4.46 x10e6/mcL (ref 3.60–5.20)
RDW: 11.9 % (ref 11.0–16.0)
WBC: 7.6 10*3/uL (ref 3.8–10.6)

## 2019-06-14 LAB — COMPREHENSIVE METABOLIC PANEL
ALT: 18 U/L (ref 0–33)
AST: 23 U/L (ref 0–32)
Albumin/Globulin Ratio: 2.14 mmol/L — ABNORMAL HIGH (ref 1.00–2.00)
Albumin: 4.7 g/dL (ref 3.5–5.2)
Alk Phosphatase: 43 U/L (ref 35–117)
Anion Gap: 9 mmol/L (ref 2–17)
BUN: 14 mg/dL (ref 6–20)
CO2: 26 mmol/L (ref 22–29)
Calcium: 9.5 mg/dL (ref 8.6–10.0)
Chloride: 103 mmol/L (ref 98–107)
Creatinine: 0.7 mg/dL (ref 0.5–1.0)
GFR African American: 138 mL/min/{1.73_m2} (ref >=90)
GFR Non-African American: 119 mL/min/{1.73_m2} (ref >=90)
Globulin: 2.2 g/dL (ref 1.9–4.4)
Glucose: 91 mg/dL (ref 70–99)
OSMOLALITY CALCULATED: 276 mOsm/kg (ref 270–287)
Potassium: 4.6 mmol/L (ref 3.5–5.3)
Sodium: 138 mmol/L (ref 135–145)
Total Bilirubin: 0.3 mg/dL (ref 0.00–1.20)
Total Protein: 6.9 g/dL (ref 6.4–8.3)

## 2019-06-14 LAB — LIPASE: Lipase: 19 U/L (ref 13–60)

## 2019-06-14 LAB — POC PREGNANCY UR-QUAL: Preg Test, Ur: NEGATIVE

## 2019-08-25 NOTE — ED Notes (Signed)
ED Patient Education Note     Patient Education Materials Follows:  Behavioral Health     Alcohol Intoxication    Alcohol intoxication occurs when the amount of alcohol that a person has consumed impairs his or her ability to mentally and physically function. Alcohol directly impairs the normal chemical activity of the brain. Drinking large amounts of alcohol can lead to changes in mental function and behavior, and it can cause many physical effects that can be harmful.     Alcohol intoxication can range in severity from mild to very severe. Various factors can affect the level of intoxication that occurs, such as the person's age, gender, weight, frequency of alcohol consumption, and the presence of other medical conditions (such as diabetes, seizures, or heart conditions). Dangerous levels of alcohol intoxication may occur when people drink large amounts of alcohol in a short period (binge drinking). Alcohol can also be especially dangerous when combined with certain prescription medicines or "recreational" drugs.    SIGNS AND SYMPTOMS    Some common signs and symptoms of mild alcohol intoxication include:     Loss of coordination.     Changes in mood and behavior.     Impaired judgment.     Slurred speech.    As alcohol intoxication progresses to more severe levels, other signs and symptoms will appear. These may include:     Vomiting.     Confusion and impaired memory.     Slowed breathing.     Seizures.     Loss of consciousness.    DIAGNOSIS    Your health care provider will take a medical history and perform a physical exam. You will be asked about the amount and type of alcohol you have consumed. Blood tests will be done to measure the concentration of alcohol in your blood. In many places, your blood alcohol level must be lower than 80 mg/dL (0.45%) to legally drive. However, many dangerous effects of alcohol can occur at much lower levels.     TREATMENT    People with alcohol intoxication often do not  require treatment. Most of the effects of alcohol intoxication are temporary, and they go away as the alcohol naturally leaves the body. Your health care provider will monitor your condition until you are stable enough to go home. Fluids are sometimes given through an IV access tube to help prevent dehydration.     HOME CARE INSTRUCTIONS     Do not drive after drinking alcohol.     Stay hydrated. Drink enough water and fluids to keep your urine clear or pale yellow. Avoid caffeine. ?     Only take over-the-counter or prescription medicines as directed by your health care provider. ?    SEEK MEDICAL CARE IF:     You have persistent vomiting. ?     You do not feel better after a few days.     You have frequent alcohol intoxication. Your health care provider can help determine if you should see a substance use treatment counselor.    SEEK IMMEDIATE MEDICAL CARE IF:     You become shaky or tremble when you try to stop drinking. ?     You shake uncontrollably (seizure). ?     You throw up (vomit) blood. This may be bright red or may look like black coffee grounds. ?     You have blood in your stool. This may be bright red or may appear as a black, tarry, bad smelling  stool. ?     You become lightheaded or faint. ?    MAKE SURE YOU:     Understand these instructions.     Will watch your condition.     Will get help right away if you are not doing well or get worse.    This information is not intended to replace advice given to you by your health care provider. Make sure you discuss any questions you have with your health care provider.    Document Released: 02/15/2005 Document Revised: 01/08/2013 Document Reviewed: 10/11/2012  Elsevier Interactive Patient Education ?2016 Elsevier Inc.

## 2019-08-25 NOTE — ED Notes (Signed)
ED Notes               Pt. found her ID and car keys and was oriented to all spheres and called a friend for a ride home who promptly appeared within minutes and walked with her out the door after an assessment by Dr. Anda Kraft.  [employer]

## 2019-08-25 NOTE — ED Notes (Signed)
ED Patient Summary       ;       [facility]  [address] [place], [place] [address]  [phone]  Discharge Instructions (Patient)  _______________________________________     Name: Sandra Li, Sandra Li  DOB: 29-Sep-1990                   MRN: [id number]                   FIN: XNA%>[id number]  Reason For Visit: Alcohol intoxication; ETOH  Final Diagnosis: Acute alcohol intoxication     Visit Date: 08/25/2019 12:42:00  Address: [address] [place] [facility] [address]  Phone: [phone]     Emergency Department Providers:        Primary Physician:   [facility] would like to thank you for allowing [place] to assist you with your healthcare needs. The following includes patient education materials and information regarding your injury/illness.     Follow-up Instructions:  You were seen today on an emergency basis. Please contact your primary care doctor for a follow up appointment. If you received a referral to a specialist doctor, it is important you follow-up as instructed.    It is important that you call your follow-up doctor to schedule and confirm the location of your next appointment. Your doctor may practice at multiple locations. The office location of your follow-up appointment may be different to the one written on your discharge instructions.    If you do not have a primary care doctor, please call [phone] for help in finding a [facility] Provider. For help in finding a specialist doctor, please call [phone].    The [facility] "Ask a Nurse" line in staffed by Registered Nurses and is a free service to the community. We are available Monday - Friday from 8am to 5pm to answer your questions about your health. Please call [phone].    If your condition gets worse before your follow-up with your primary care doctor or specialist, please return to the Emergency  Department.        Coronavirus 2019 (COVID-19) Reminders:     Patients aged 75 and older, people with increased risk for severe COVID-19 disease, or frontline workers with increased occupational risk can make an appointment for a COVID-19 vaccine. Patients can contact their [facility] doctors' offices to schedule an appointment to receive the COVID-19 vaccine at the [facility] or send [place] an email at [email or web address]. Patients who do not have a [facility] physician can call [phone] to schedule vaccination appointments.            Scan this code with your phone camera to send an email to the address above.          Follow Up Appointments:  Primary Care Provider:      Name: Jolyne Loa L-MD      Phone: [phone]                 With: Address: When:   Follow up with primary care provider  Within 1 week              Printed Prescriptions:    Patient Education Materials:  Discharge Orders          Discharge Patient 08/25/19 12:48:00  EDT         Comment:      Alcohol Intoxication     Alcohol Intoxication    Alcohol intoxication occurs when the amount of alcohol that a person has consumed impairs his or her ability to mentally and physically function. Alcohol directly impairs the normal chemical activity of the brain. Drinking large amounts of alcohol can lead to changes in mental function and behavior, and it can cause many physical effects that can be harmful.     Alcohol intoxication can range in severity from mild to very severe. Various factors can affect the level of intoxication that occurs, such as the person's age, gender, weight, frequency of alcohol consumption, and the presence of other medical conditions (such as diabetes, seizures, or heart conditions). Dangerous levels of alcohol intoxication may occur when people drink large amounts of alcohol in a short period (binge drinking). Alcohol can also be especially dangerous when combined with  certain prescription medicines or "recreational" drugs.    SIGNS AND SYMPTOMS    Some common signs and symptoms of mild alcohol intoxication include:     Loss of coordination.     Changes in mood and behavior.     Impaired judgment.     Slurred speech.    As alcohol intoxication progresses to more severe levels, other signs and symptoms will appear. These may include:     Vomiting.     Confusion and impaired memory.     Slowed breathing.     Seizures.     Loss of consciousness.    DIAGNOSIS    Your health care provider will take a medical history and perform a physical exam. You will be asked about the amount and type of alcohol you have consumed. Blood tests will be done to measure the concentration of alcohol in your blood. In many places, your blood alcohol level must be lower than 80 mg/dL (1.61%) to legally drive. However, many dangerous effects of alcohol can occur at much lower levels.     TREATMENT    People with alcohol intoxication often do not require treatment. Most of the effects of alcohol intoxication are temporary, and they go away as the alcohol naturally leaves the body. Your health care provider will monitor your condition until you are stable enough to go home. Fluids are sometimes given through an IV access tube to help prevent dehydration.     HOME CARE INSTRUCTIONS     Do not drive after drinking alcohol.     Stay hydrated. Drink enough water and fluids to keep your urine clear or pale yellow. Avoid caffeine. ?     Only take over-the-counter or prescription medicines as directed by your health care provider. ?    SEEK MEDICAL CARE IF:     You have persistent vomiting. ?     You do not feel better after a few days.     You have frequent alcohol intoxication. Your health care provider can help determine if you should see a substance use treatment counselor.    SEEK IMMEDIATE MEDICAL CARE IF:     You become shaky or tremble when you try to stop drinking. ?     You shake uncontrollably (seizure).  ?     You throw up (vomit) blood. This may be bright red or may look like black coffee grounds. ?     You have blood in your stool. This may be bright red or may appear as a black,  tarry, bad smelling stool. ?     You become lightheaded or faint. ?    MAKE SURE YOU:     Understand these instructions.     Will watch your condition.     Will get help right away if you are not doing well or get worse.    This information is not intended to replace advice given to you by your health care provider. Make sure you discuss any questions you have with your health care provider.    Document Released: 02/15/2005 Document Revised: 01/08/2013 Document Reviewed: 10/11/2012  Elsevier Interactive Patient Education ?2016 Elsevier Inc.         Allergy Info: No Known Allergies     Medication Information:  [facility] provided you with a complete list of medications post discharge, if you have been instructed to stop taking a medication please ensure you also follow up with this information to your Primary Care Physician.  Unless otherwise noted, patient will continue to take medications as prescribed prior to the Emergency Room visit.  Any specific questions regarding your chronic medications and dosages should be discussed with your physician(s) and pharmacist.          famotidine (Pepcid 20 mg oral tablet) 1 Tabs Oral (given by mouth) 2 times a day. Refills: 0.  metoprolol (metoprolol tartrate 50 mg oral tablet) 1.5 Tabs Oral (given by mouth) 2 times a day.  norethindrone (Errin 0.35 mg oral tablet) 1 Tabs Oral (given by mouth) every day.  oxybutynin (oxybutynin 10 mg/24 hr oral tablet, extended release) Oral (given by mouth) every day.      Medications Administered During Visit:       Major Tests and Procedures:  The following procedures and tests were performed during your Emergency Room visit.  COMMON PROCEDURES%>  COMMON PROCEDURES COMMENTS%>          Laboratory Orders  No laboratory orders were  placed.              Radiology Orders  No radiology orders were placed.              Patient Care Orders  Name Status Details   Communication to Nursing Completed 08/25/19 12:54:52 EDT, Move into view of RN station if possible, 08/25/19 12:54:52 EDT   Discharge Patient Ordered 08/25/19 12:48:00 EDT   ED Assessment Adult Ordered 08/25/19 12:48:38 EDT, 08/25/19 12:48:38 EDT   ED Secondary Triage Completed 08/25/19 12:48:38 EDT, 08/25/19 12:48:38 EDT   ED Triage Adult Completed 08/25/19 12:42:15 EDT, 08/25/19 12:42:15 EDT   Fall Risk Precautions Ordered 08/25/19 12:54:52 EDT, 08/25/19 12:54:52 EDT   Patient Specific Fall Safety Measures Ordered 08/25/19 12:54:52 EDT, q1hr, ED Mod/High Fall Risk Documentation   Patient Specific Fall Safety Measures Ordered 08/25/19 14:00:00 EDT, ED Mod/High Fall Risk Documentation   Patient Specific Fall Safety Measures Ordered 08/25/19 15:00:00 EDT, ED Mod/High Fall Risk Documentation   Patient Specific Fall Safety Measures Ordered 08/25/19 16:00:00 EDT, ED Mod/High Fall Risk Documentation   Patient Specific Fall Safety Measures Ordered 08/25/19 17:00:00 EDT, ED Mod/High Fall Risk Documentation   Patient Specific Fall Safety Measures Ordered 08/25/19 18:00:00 EDT, ED Mod/High Fall Risk Documentation   Patient Specific Fall Safety Measures Ordered 08/25/19 19:00:00 EDT, ED Mod/High Fall Risk Documentation   Patient Specific Fall Safety Measures Ordered 08/25/19 20:00:00 EDT, ED Mod/High Fall Risk Documentation   Patient Specific Fall Safety Measures Ordered 08/25/19 21:00:00 EDT, ED Mod/High Fall Risk Documentation   Patient Specific  Fall Safety Measures Ordered 08/25/19 22:00:00 EDT, ED Mod/High Fall Risk Documentation   Patient Specific Fall Safety Measures Ordered 08/25/19 23:00:00 EDT, ED Mod/High Fall Risk Documentation   Patient Specific Fall Safety Measures Ordered 08/26/19 0:00:00 EDT, ED Mod/High Fall Risk Documentation   Patient Specific Fall Safety Measures Ordered 08/26/19  1:00:00 EDT, ED Mod/High Fall Risk Documentation   Patient Specific Fall Safety Measures Ordered 08/26/19 2:00:00 EDT, ED Mod/High Fall Risk Documentation   Patient Specific Fall Safety Measures Ordered 08/26/19 3:00:00 EDT, ED Mod/High Fall Risk Documentation   Patient Specific Fall Safety Measures Ordered 08/26/19 4:00:00 EDT, ED Mod/High Fall Risk Documentation   Patient Specific Fall Safety Measures Ordered 08/26/19 5:00:00 EDT, ED Mod/High Fall Risk Documentation   Patient Specific Fall Safety Measures Ordered 08/26/19 6:00:00 EDT, ED Mod/High Fall Risk Documentation   Patient Specific Fall Safety Measures Ordered 08/26/19 7:00:00 EDT, ED Mod/High Fall Risk Documentation   Patient Specific Fall Safety Measures Ordered 08/26/19 8:00:00 EDT, ED Mod/High Fall Risk Documentation   Patient Specific Fall Safety Measures Ordered 08/26/19 10:00:00 EDT, ED Mod/High Fall Risk Documentation   Patient Specific Fall Safety Measures Ordered 08/26/19 11:00:00 EDT, ED Mod/High Fall Risk Documentation   Patient Specific Fall Safety Measures Ordered 08/26/19 12:00:00 EDT, ED Mod/High Fall Risk Documentation   Patient Specific Fall Safety Measures Ordered 08/26/19 13:00:00 EDT, ED Mod/High Fall Risk Documentation   Patient Specific Fall Safety Measures Ordered 08/26/19 14:00:00 EDT, ED Mod/High Fall Risk Documentation   Patient Specific Fall Safety Measures Ordered 08/26/19 15:00:00 EDT, ED Mod/High Fall Risk Documentation   Patient Specific Fall Safety Measures Ordered 08/26/19 16:00:00 EDT, ED Mod/High Fall Risk Documentation   Patient Specific Fall Safety Measures Ordered 08/26/19 17:00:00 EDT, ED Mod/High Fall Risk Documentation   Patient Specific Fall Safety Measures Ordered 08/26/19 18:00:00 EDT, ED Mod/High Fall Risk Documentation   Patient Specific Fall Safety Measures Ordered 08/26/19 9:00:00 EDT, ED Mod/High Fall Risk Documentation   Patient Specific Fall Safety Measures Completed 08/25/19 12:54:52 EDT, Once, 08/25/19  12:54:52 EDT, ED Mod/High Fall Risk Documentation   Patient Specific Fall Safety Measures Ordered 08/25/19 13:00:00 EDT, ED Mod/High Fall Risk Documentation       ---------------------------------------------------------------------------------------------------------------------  [facility]) encourages you to self-enroll in the [facility] Patient Portal.  [facility] Patient Portal will allow you to manage your personal health information securely from your own electronic device now and in the future.  To begin your Patient Portal enrollment process, please visit [email or web address] Click on "Sign up now" under [facility].  If you find that you need additional assistance on the [facility] Patient Portal or need a copy of your medical records, please call the [facility] Medical Records Office at [phone].  Comment:

## 2019-08-25 NOTE — ED Notes (Signed)
ED Triage Note       ED Triage Adult Entered On:  08/25/2019 12:48 EDT    Performed On:  08/25/2019 12:43 EDT by Lequita Halt, RN, Diane C               Triage   Chief Complaint :   pt brought in by ems, as called by a bystander who observed pt in car- pt states she has been drinking and tooke her topomax and xanax   Numeric Rating Pain Scale :   0 = No pain   [place] Mode of Arrival :   Ambulance   Infectious Disease Documentation :   Document assessment   Temperature Oral :   37 degC(Converted to: 98.6 degF)    Heart Rate Monitored :   91 bpm   Respiratory Rate :   14 br/min   Systolic Blood Pressure :   114 mmHg   Diastolic Blood Pressure :   79 mmHg   SpO2 :   97 %   Patient presentation :   None of the above   Chief Complaint or Presentation suggest infection :   No   Weight Dosing :   56 kg(Converted to: 123 lb 7 oz)    Height :   148 cm(Converted to: 4 ft 10 in)    Body Mass Index Dosing :   26 kg/m2   Tressa Busman - 08/25/2019 12:43 EDT   DCP GENERIC CODE   Tracking Acuity :   3   Tracking Group :   ED [employer]acking [employer]   [place], RN, Cindie Crumbly - 08/25/2019 12:43 EDT   ED General Section :   Document assessment   Pregnancy Status :   Patient denies   Last Menstrual Period :   08/22/2019 EDT   ED Allergies Section :   Document assessment   ED Reason for Visit Section :   Document assessment   ED Home Meds Section :   Document assessment   ED Quick Assessment :   Patient appears awake, alert, oriented to baseline. Skin warm and dry. Moves all extremities. Respiration even and unlabored. Appears in no apparent distress.   Lequita Halt RN, Cindie Crumbly - 08/25/2019 12:43 EDT   PTA/Triage Treatments   ED PTA Pre-Arrival Service :   [facility]   [place], [place], Cindie Crumbly - 08/25/2019 12:43 EDT   ID Risk Screen Symptoms   Recent Travel History :   No recent travel   Close Contact with COVID-19 ID :   No   Last 14 days COVID-19 ID :   No   Lequita Halt RN, Diane C - 08/25/2019 12:43 EDT   Allergies   (As Of: 08/25/2019 12:48:37 EDT)    Allergies (Active)   No Known Allergies  Estimated Onset Date:   Unspecified ; Created By:   Ladona Ridgel, RN, Victorino Dike S; Reaction Status:   Active ; Category:   Drug ; Substance:   No Known Allergies ; Type:   Allergy ; Updated By:   Laruth Bouchard; Reviewed Date:   08/25/2019 12:45 EDT        Psycho-Social   Last 3 mo, thoughts killing self/others :   Patient denies   Right click within box for Suspected Abuse policy link. :   None   Feels Unsafe at Home :   Yes   Lequita Halt, RN, Cindie Crumbly - 08/25/2019 12:43 EDT   ED Home Med List   Medication List   (  As Of: 08/25/2019 12:48:37 EDT)   Prescription/Discharge Order    famotidine  :   famotidine ; Status:   Prescribed ; Ordered As Mnemonic:   Pepcid 20 mg oral tablet ; Simple Display Line:   20 mg, 1 tabs, Oral, BID, 60 tabs, 0 Refill(s) ; Ordering Provider:   Rulon Sera; Catalog Code:   famotidine ; Order Dt/Tm:   06/13/2019 20:15:50 EST            Home Meds    oxybutynin  :   oxybutynin ; Status:   Documented ; Ordered As Mnemonic:   oxybutynin 10 mg/24 hr oral tablet, extended release ; Simple Display Line:   mg, tabs, Oral, Daily, 0 Refill(s) ; Catalog Code:   oxybutynin ; Order Dt/Tm:   06/13/2019 19:01:23 EST          metoprolol  :   metoprolol ; Status:   Documented ; Ordered As Mnemonic:   metoprolol tartrate 50 mg oral tablet ; Simple Display Line:   75 mg, 1.5 tabs, Oral, BID, 0 Refill(s) ; Catalog Code:   metoprolol ; Order Dt/Tm:   06/13/2019 19:00:35 EST          norethindrone  :   norethindrone ; Status:   Documented ; Ordered As Mnemonic:   Errin 0.35 mg oral tablet ; Simple Display Line:   0.35 mg, 1 tabs, Oral, Daily, 28 tabs, 0 Refill(s) ; Catalog Code:   norethindrone ; Order Dt/Tm:   06/13/2019 19:00:57 EST            ED Reason for Visit   (As Of: 08/25/2019 12:48:37 EDT)   Problems(Active)    HTN (hypertension) (SNOMED CT  :1610960454 )  Name of Problem:   HTN (hypertension) ; Recorder:   Ladona Ridgel, RN, JENNIFER S; Confirmation:   Confirmed ;  Classification:   Patient Stated ; Code:   [id number] ; Contributor System:   Dietitian ; Last Updated:   06/13/2019 18:04 EST ; Life Cycle Date:   06/13/2019 ; Life Cycle Status:   Active ; Vocabulary:   SNOMED CT          Diagnoses(Active)    Acute alcohol intoxication  Date:   08/25/2019 ; Diagnosis Type:   Discharge ; Confirmation:   Confirmed ; Clinical Dx:   Acute alcohol intoxication ; Classification:   Medical ; Clinical Service:   Non-Specified ; Code:   ICD-10-CM ; Probability:   0 ; Diagnosis Code:   F10.929      Alcohol intoxication  Date:   08/25/2019 ; Diagnosis Type:   Reason For Visit ; Confirmation:   Complaint of ; Clinical Dx:   Alcohol intoxication ; Classification:   Medical ; Clinical Service:   Emergency medicine ; Code:   PNED ; Probability:   0 ; Diagnosis Code:   [id number]

## 2019-08-25 NOTE — ED Notes (Signed)
ED Pre-Arrival Note        Pre-Arrival Summary    Name:  medic 2,    Current Date:  08/25/2019 12:43:32 EDT  Gender:  Female  Date of Birth:    Age:  29  Pre-Arrival Type:  EMS  ETA:  08/25/2019 12:57:00 EDT  Primary Care Physician:    Presenting Problem:  intoxicated  Pre-Arrival User:  Domingo Mend A  Referring Source:    Location:  [facility]            PreArrival Communication Form  Emergency Department        Additional Patient Information:        Orders:  [    ] CBC                                            [     ] CT Head no contrast  [    ] BMP                                           [     ] CT Abdomen/Pelvis no contrast  [    ] PT/INR                                       [     ] CT Abdomen/Pelvis IV contrast, w/ oral contrast  [    ] Troponin                                   [     ] CT Abdomen/Pelvis IV contrast, no oral contrast  [    ] BNP                                            [     ] See ordersheet  [    ] CXR                                             [     ] Other:__________________________  [    ] EKG

## 2019-08-25 NOTE — Discharge Summary (Signed)
ED Clinical Summary                     [facility]  [address]  [place], [place] [address]  [id number]          PERSON INFORMATION  Name: DANAJIA, GOODNER Age:  29 Years DOB: 1991/03/06   Sex: Female Language: English PCP: Jolyne Loa L-MD   Marital Status: Unknown Phone: [phone] Med Service: MED-Medicine   MRN: [id number] Acct# [id number] Arrival: 08/25/2019 12:42:00   Visit Reason: Alcohol intoxication; ETOH Acuity: 3 LOS: 000 00:15   Address:    [address] [place] [address]   Diagnosis:    Acute alcohol intoxication  Medications:          Medications that have not changed  Other Medications  famotidine (Pepcid 20 mg oral tablet) 1 Tabs Oral (given by mouth) 2 times a day. Refills: 0.  Last Dose:____________________  metoprolol (metoprolol tartrate 50 mg oral tablet) 1.5 Tabs Oral (given by mouth) 2 times a day.  Last Dose:____________________  norethindrone (Errin 0.35 mg oral tablet) 1 Tabs Oral (given by mouth) every day.  Last Dose:____________________  oxybutynin (oxybutynin 10 mg/24 hr oral tablet, extended release) Oral (given by mouth) every day.  Last Dose:____________________      Medications Administered During Visit:              Allergies      No Known Allergies      Major Tests and Procedures:  The following procedures and tests were performed during your ED visit.  COMMON PROCEDURES%>  COMMON PROCEDURES COMMENTS%>                PROVIDER INFORMATION               Provider Role Assigned Collene Mares T-MD ED Provider 08/25/2019 12:42:37    Cy Blamer ED Nurse 08/25/2019 12:52:07        Attending Physician:  DEFAULT,  DOCTOR      Admit Doc  DEFAULT,  DOCTOR     Consulting Doc       VITALS INFORMATION  Vital Sign Triage Latest   Temp Oral ORAL_1%> ORAL%>   Temp Temporal TEMPORAL_1%> TEMPORAL%>   Temp Intravascular INTRAVASCULAR_1%> INTRAVASCULAR%>   Temp Axillary AXILLARY_1%> AXILLARY%>   Temp Rectal RECTAL_1%>  RECTAL%>   02 Sat 97 % 97 %   Respiratory Rate RATE_1%> RATE%>   Peripheral Pulse Rate PULSE RATE_1%> PULSE RATE%>   Apical Heart Rate HEART RATE_1%> HEART RATE%>   Blood Pressure BLOOD PRESSURE_1%>/ BLOOD PRESSURE_1%>79 mmHg BLOOD PRESSURE%> / BLOOD PRESSURE%>79 mmHg                 Immunizations      No Immunizations Documented This Visit          DISCHARGE INFORMATION   Discharge Disposition: H Outpt-Sent Home   Discharge Location:  Home   Discharge Date and Time:  08/25/2019 12:57:25   ED Checkout Date and Time:  08/25/2019 12:57:25     DEPART REASON INCOMPLETE INFORMATION               Depart Action Incomplete Reason   Interactive View/I&O Recently assessed               Problems      No Problems Documented              Smoking Status  ED Clinical Summary                     [facility]  [address]  [place], [place] [address]  [phone]          PERSON INFORMATION  Name: DANAJIA, GOODNER Age:  29 Years DOB: 1991/03/06   Sex: Female Language: English PCP: Jolyne Loa L-MD   Marital Status: Unknown Phone: [phone] Med Service: MED-Medicine   MRN: [id number] Acct# [id number] Arrival: 08/25/2019 12:42:00   Visit Reason: Alcohol intoxication; ETOH Acuity: 3 LOS: 000 00:15   Address:    [address] [place] [address]   Diagnosis:    Acute alcohol intoxication  Medications:          Medications that have not changed  Other Medications  famotidine (Pepcid 20 mg oral tablet) 1 Tabs Oral (given by mouth) 2 times a day. Refills: 0.  Last Dose:____________________  metoprolol (metoprolol tartrate 50 mg oral tablet) 1.5 Tabs Oral (given by mouth) 2 times a day.  Last Dose:____________________  norethindrone (Errin 0.35 mg oral tablet) 1 Tabs Oral (given by mouth) every day.  Last Dose:____________________  oxybutynin (oxybutynin 10 mg/24 hr oral tablet, extended release) Oral (given by mouth) every day.  Last Dose:____________________      Medications Administered During Visit:              Allergies      No Known Allergies      Major Tests and Procedures:  The following procedures and tests were performed during your ED visit.  COMMON PROCEDURES%>  COMMON PROCEDURES COMMENTS%>                PROVIDER INFORMATION               Provider Role Assigned Collene Mares T-MD ED Provider 08/25/2019 12:42:37    Cy Blamer ED Nurse 08/25/2019 12:52:07        Attending Physician:  DEFAULT,  DOCTOR      Admit Doc  DEFAULT,  DOCTOR     Consulting Doc       VITALS INFORMATION  Vital Sign Triage Latest   Temp Oral ORAL_1%> ORAL%>   Temp Temporal TEMPORAL_1%> TEMPORAL%>   Temp Intravascular INTRAVASCULAR_1%> INTRAVASCULAR%>   Temp Axillary AXILLARY_1%> AXILLARY%>   Temp Rectal RECTAL_1%>  RECTAL%>   02 Sat 97 % 97 %   Respiratory Rate RATE_1%> RATE%>   Peripheral Pulse Rate PULSE RATE_1%> PULSE RATE%>   Apical Heart Rate HEART RATE_1%> HEART RATE%>   Blood Pressure BLOOD PRESSURE_1%>/ BLOOD PRESSURE_1%>79 mmHg BLOOD PRESSURE%> / BLOOD PRESSURE%>79 mmHg                 Immunizations      No Immunizations Documented This Visit          DISCHARGE INFORMATION   Discharge Disposition: H Outpt-Sent Home   Discharge Location:  Home   Discharge Date and Time:  08/25/2019 12:57:25   ED Checkout Date and Time:  08/25/2019 12:57:25     DEPART REASON INCOMPLETE INFORMATION               Depart Action Incomplete Reason   Interactive View/I&O Recently assessed               Problems      No Problems Documented              Smoking Status  4 or less cigarettes(less than 1/4 pack)/day in last 30 days         PATIENT EDUCATION INFORMATION  Instructions:     Alcohol Intoxication     Follow up:                   With: Address: When:   Follow up with primary care provider  Within 1 week              ED PROVIDER DOCUMENTATION     Patient:   OLIVIANNA, HALAS            MRN: [id number]            FIN: [id number]               Age:   82 years     Sex:  Female     DOB:  10-Sep-1990   Associated Diagnoses:   Acute alcohol intoxication   Author:   Duwaine Maxin T-MD      Basic Information   Time seen: Provider Seen (ST)   ED Provider/Time:    Duwaine Maxin T-MD / 08/25/2019 12:42  .   Additional information: Chief Complaint from Nursing Triage Note   Chief Complaint   No qualifying data available.Marland Kitchen      History of Present Illness   The patient presents with 29 year old female presents here for intoxication.  Patient endorses drinking alcohol, taking her prescribed Xanax and Topamax.  She is found intoxicated in public and presents here via EMS.  Patient has an unsteady gait.  She is oriented to person, place, time, but is slurring her speech as well..        Review of Systems   Constitutional symptoms:  No generalized weakness,  no fatigue.    Skin symptoms:  No rash, no lesion.    Respiratory symptoms:  No shortness of breath,    Cardiovascular symptoms:  No chest pain,    Psychiatric symptoms:  Substance abuse, no anxiety, no depression.       Health Status   Allergies:    Allergic Reactions (Selected)  No Known Allergies.   Medications:  (Selected)   Prescriptions  Prescribed  Pepcid 20 mg oral tablet: 20 mg, 1 tabs, Oral, BID, 60 tabs, 0 Refill(s)  Documented Medications  Documented  Errin 0.35 mg oral tablet: 0.35 mg, 1 tabs, Oral, Daily, 28 tabs, 0 Refill(s)  metoprolol tartrate 50 mg oral tablet: 75 mg, 1.5 tabs, Oral, BID, 0 Refill(s)  oxybutynin 10 mg/24 hr oral tablet, extended release: mg, tabs, Oral, Daily, 0 Refill(s).      Past Medical/ Family/ Social History   Surgical history: Reviewed as documented in chart.   Family history: Reviewed as documented in chart.   Social history: Reviewed as documented in chart.   Problem list:    Active Problems (1)  HTN (hypertension)   .      Physical Examination   Glasgow coma scale:  Total score: Total score: 15.   Neurological:  Alert and oriented to person, place, time, and situation, No focal neurological deficit observed, Slurred speech, slightly ataxic gait.    General:  Alert, no acute distress.    Skin:  Warm.   Head:  Normocephalic, atraumatic.    Eye:  Extraocular movements are intact, normal conjunctiva.    Respiratory:  Respirations are non-labored, Symmetrical chest wall expansion.       Medical Decision Making   Rationale:  29 year old female

## 2019-08-25 NOTE — ED Notes (Signed)
ED Triage Note       ED Secondary Triage Entered On:  08/25/2019 12:54 EDT    Performed On:  08/25/2019 12:54 EDT by Cy Blamer               General Information   Barriers to Learning :   None evident   ED Home Meds Section :   Document assessment   [facility] ED Fall Risk Section :   Document assessment   ED Advance Directives Section :   Document assessment   ED Palliative Screen :   N/A (prefilled for <28yo)   Cy Blamer - 08/25/2019 12:54 EDT   (As Of: 08/25/2019 12:54:52 EDT)   Problems(Active)    HTN (hypertension) (SNOMED CT  :9604540981 )  Name of Problem:   HTN (hypertension) ; Recorder:   Ladona Ridgel, RN, JENNIFER S; Confirmation:   Confirmed ; Classification:   Patient Stated ; Code:   [id number] ; Contributor System:   Dietitian ; Last Updated:   06/13/2019 18:04 EST ; Life Cycle Date:   06/13/2019 ; Life Cycle Status:   Active ; Vocabulary:   SNOMED CT          Diagnoses(Active)    Acute alcohol intoxication  Date:   08/25/2019 ; Diagnosis Type:   Discharge ; Confirmation:   Confirmed ; Clinical Dx:   Acute alcohol intoxication ; Classification:   Medical ; Clinical Service:   Non-Specified ; Code:   ICD-10-CM ; Probability:   0 ; Diagnosis Code:   F10.929      Alcohol intoxication  Date:   08/25/2019 ; Diagnosis Type:   Reason For Visit ; Confirmation:   Complaint of ; Clinical Dx:   Alcohol intoxication ; Classification:   Medical ; Clinical Service:   Emergency medicine ; Code:   PNED ; Probability:   0 ; Diagnosis Code:   [id number]             -    Procedure History   (As Of: 08/25/2019 12:54:52 EDT)     [facility] Fall Risk Assessment Tool   Hx of falling last 3 months ED Fall :   No   Patient confused or disoriented ED Fall :   No   Patient intoxicated or sedated ED Fall :   Yes   Patient impaired gait ED Fall :   No   Use a mobility assistance device ED Fall :   No   Patient altered elimination ED Fall :   No   [facility] ED Fall Score :   3    Cy Blamer - 08/25/2019  12:54 EDT   ED Advance Directive   Advance Directive :   No   Cy Blamer - 08/25/2019 12:54 EDT

## 2019-08-25 NOTE — ED Provider Notes (Signed)
Alcohol Intoxication *ED        Patient:   Sandra Li, Sandra Li            MRN: [id number]            FIN: [id number]               Age:   29 years     Sex:  Female     DOB:  08/10/1990   Associated Diagnoses:   Acute alcohol intoxication   Author:   Allyne Gee T-MD      Basic Information   Time seen: Provider Seen (ST)   ED Provider/Time:    Allyne Gee T-MD / 08/25/2019 12:42  .   Additional information: Chief Complaint from Nursing Triage Note   Chief Complaint   No qualifying data available.Marland Kitchen      History of Present Illness   The patient presents with 29 year old female presents here for intoxication.  Patient endorses drinking alcohol, taking her prescribed Xanax and Topamax.  She is found intoxicated in public and presents here via EMS.  Patient has an unsteady gait.  She is oriented to person, place, time, but is slurring her speech as well..        Review of Systems   Constitutional symptoms:  No generalized weakness, no fatigue.    Skin symptoms:  No rash, no lesion.    Respiratory symptoms:  No shortness of breath,    Cardiovascular symptoms:  No chest pain,    Psychiatric symptoms:  Substance abuse, no anxiety, no depression.       Health Status   Allergies:    Allergic Reactions (Selected)  No Known Allergies.   Medications:  (Selected)   Prescriptions  Prescribed  Pepcid 20 mg oral tablet: 20 mg, 1 tabs, Oral, BID, 60 tabs, 0 Refill(s)  Documented Medications  Documented  Errin 0.35 mg oral tablet: 0.35 mg, 1 tabs, Oral, Daily, 28 tabs, 0 Refill(s)  metoprolol tartrate 50 mg oral tablet: 75 mg, 1.5 tabs, Oral, BID, 0 Refill(s)  oxybutynin 10 mg/24 hr oral tablet, extended release: mg, tabs, Oral, Daily, 0 Refill(s).      Past Medical/ Family/ Social History   Surgical history: Reviewed as documented in chart.   Family history: Reviewed as documented in chart.   Social history: Reviewed as documented in chart.   Problem list:    Active Problems (1)  HTN (hypertension)   .      Physical Examination    Glasgow coma scale:  Total score: Total score: 15.   Neurological:  Alert and oriented to person, place, time, and situation, No focal neurological deficit observed, Slurred speech, slightly ataxic gait.    General:  Alert, no acute distress.    Skin:  Warm.   Head:  Normocephalic, atraumatic.    Eye:  Extraocular movements are intact, normal conjunctiva.    Respiratory:  Respirations are non-labored, Symmetrical chest wall expansion.       Medical Decision Making   Rationale:  29 year old female presents with clinical intoxication.  She is everything to call her a sober ride home.  When somebody comes to pick her up, she will be discharged..      Impression and Plan   Diagnosis   Acute alcohol intoxication (ICD10-CM F10.929, Discharge, Medical)   Plan   Condition: Stable.    Disposition: Discharged: Time  08/25/2019 12:48:00, to home.    Patient was given the following educational materials: Alcohol Intoxication.  Follow up with: Follow up with primary care provider Within 1 week.    Counseled: Patient, Regarding diagnosis, Regarding diagnostic results, Regarding treatment plan, Patient indicated understanding of instructions.    Signature Line     Electronically Signed on 08/25/2019 12:49 PM EDT   ________________________________________________   Duwaine Maxin T-MD               Modified by: Duwaine Maxin T-MD on 08/25/2019 12:49 PM EDT

## 2021-01-08 LAB — CULTURE, URINE: FINAL REPORT: NO GROWTH

## 2021-04-04 DIAGNOSIS — F411 Generalized anxiety disorder: Secondary | ICD-10-CM | POA: Diagnosis not present

## 2021-04-04 DIAGNOSIS — Z3041 Encounter for surveillance of contraceptive pills: Secondary | ICD-10-CM | POA: Diagnosis not present

## 2021-04-04 DIAGNOSIS — F410 Panic disorder [episodic paroxysmal anxiety] without agoraphobia: Secondary | ICD-10-CM | POA: Diagnosis not present

## 2021-04-21 DIAGNOSIS — R0789 Other chest pain: Secondary | ICD-10-CM | POA: Diagnosis not present

## 2021-04-21 DIAGNOSIS — F32A Depression, unspecified: Secondary | ICD-10-CM | POA: Diagnosis not present

## 2021-04-21 DIAGNOSIS — F411 Generalized anxiety disorder: Secondary | ICD-10-CM | POA: Diagnosis not present

## 2021-04-23 ENCOUNTER — Emergency Department (HOSPITAL_COMMUNITY)
Admission: EM | Admit: 2021-04-23 | Discharge: 2021-04-24 | Disposition: A | Discharge status: Home / Self Care | Payer: BC Managed Care – PPO | Attending: Emergency Medicine | Admitting: Emergency Medicine

## 2021-04-23 ENCOUNTER — Other Ambulatory Visit: Payer: Self-pay

## 2021-04-23 ENCOUNTER — Encounter (HOSPITAL_COMMUNITY): Payer: Self-pay

## 2021-04-23 DIAGNOSIS — F17210 Nicotine dependence, cigarettes, uncomplicated: Secondary | ICD-10-CM | POA: Insufficient documentation

## 2021-04-23 DIAGNOSIS — W540XXA Bitten by dog, initial encounter: Secondary | ICD-10-CM | POA: Diagnosis not present

## 2021-04-23 DIAGNOSIS — Z23 Encounter for immunization: Secondary | ICD-10-CM | POA: Diagnosis not present

## 2021-04-23 DIAGNOSIS — Y92009 Unspecified place in unspecified non-institutional (private) residence as the place of occurrence of the external cause: Secondary | ICD-10-CM | POA: Diagnosis not present

## 2021-04-23 DIAGNOSIS — S61251A Open bite of left index finger without damage to nail, initial encounter: Secondary | ICD-10-CM | POA: Diagnosis not present

## 2021-04-23 NOTE — ED Triage Notes (Signed)
Patient was trying to separate dogs that were fighting at her friends house. She thinks her dogs shots are up to date. Patient got bit on her left index finger. Bleeding controlled at this time. 1 cm laceration. Patient able to feel and move her finger. Patients tetanus shot is not updated.

## 2021-04-24 ENCOUNTER — Emergency Department (HOSPITAL_COMMUNITY): Payer: BC Managed Care – PPO

## 2021-04-24 DIAGNOSIS — S61251A Open bite of left index finger without damage to nail, initial encounter: Secondary | ICD-10-CM | POA: Diagnosis not present

## 2021-04-24 MED ORDER — AMOXICILLIN-POT CLAVULANATE 875-125 MG PO TABS: 1.0000 | ORAL_TABLET | Freq: Two times a day (BID) | ORAL | 0 refills | Status: DC

## 2021-04-24 MED ORDER — TETANUS-DIPHTH-ACELL PERTUSSIS 5-2.5-18.5 LF-MCG/0.5 IM SUSY
0.5000 mL | PREFILLED_SYRINGE | Freq: Once | INTRAMUSCULAR | Status: AC
  Administered 2021-04-24: 01:00:00 0.5 mL via INTRAMUSCULAR
  Filled 2021-04-24: qty 0.5

## 2021-04-24 MED ORDER — AMOXICILLIN-POT CLAVULANATE 875-125 MG PO TABS
1.0000 | ORAL_TABLET | Freq: Once | ORAL | Status: AC
Start: 2021-04-24 — End: 2021-04-24
  Administered 2021-04-24: 01:00:00 1 via ORAL
  Filled 2021-04-24: qty 1

## 2021-04-24 NOTE — Discharge Instructions (Signed)
Keep wound clean and dry.  Take antibiotics as prescribed.  Blood pressure today is slightly elevated and y should follow-up with your primary doctor regarding this. follow-up with Dr. Janee Morn on Monday.  Return to the ED with worsening pain, weakness, numbness, increased pain or redness of the finger, fever, any other concerns.

## 2021-04-24 NOTE — ED Notes (Signed)
Patient's finger soaking in betadine and saline.  

## 2021-04-24 NOTE — ED Provider Notes (Signed)
[facility] DEPT Provider Note   CSN: [id number] Arrival date & time: 04/23/21  2236     History Chief Complaint  Patient presents with   Animal Bite    Misty Ross is a 30 y.o. female.  Patient sustained dog bite to her left index finger approximately 2 hours ago.  States she was breaking up her friends dogs that were fighting.  1 was a great day and 1 was a pitbull.  Sustained laceration to her left index finger over the volar DIP joint.  Bleeding is controlled.  Flexion and extension are intact.  No numbness or tingling.  Tetanus needs to be updated.  Did clean the wound at home. Denies any numbness or tingling.  Denies any weakness.  States dog's shots are up-to-date.  The history is provided by the patient.  Animal Bite Associated symptoms: no fever       Past Medical History:  Diagnosis Date   ADHD (attention deficit hyperactivity disorder)    Allergy    Anxiety    Depression    Seizures ([place])     Patient Active Problem List   Diagnosis Date Noted   Depression, major, recurrent ([place]) 07/17/2014   Suicide ideation 07/17/2014   Polysubstance dependence ([place]) 07/17/2014   Major depressive disorder, recurrent, severe without psychotic features ([place])    Aspiration pneumonia ([place]) 05/24/2014   Acute encephalopathy 05/23/2014   Suicidal ideation 05/23/2014   Polysubstance abuse ([place]) 05/23/2014   Acute respiratory failure with hypoxemia ([place]) 05/23/2014   Suicide attempt ([place]) 05/23/2014   Drug overdose, intentional ([place])    Tobacco use disorder 03/28/2007   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     Family History  Problem Relation Age of Onset   Hyperlipidemia Father    Cancer Maternal Grandmother     Diabetes Maternal Grandmother    Cancer Maternal Grandfather    Heart disease Paternal Grandmother    Cancer Paternal Grandfather    Multiple sclerosis Brother     Social History   Tobacco Use   Smoking status: Every Day    Packs/day: 0.50    Types: Cigarettes  Substance Use Topics   Alcohol use: Yes    Comment: one beer per day   Drug use: Yes    Comment: heroine    Home Medications Prior to Admission medications   Medication Sig Start Date End Date Taking? Authorizing Provider  albuterol (PROVENTIL HFA;VENTOLIN HFA) 108 (90 BASE) MCG/ACT inhaler Inhale 2 puffs into the lungs 4 (four) times daily.     [provider]  cloNIDine (CATAPRES) 0.2 MG tablet Take 1 tablet (0.2 mg total) by mouth 3 (three) times daily. 05/26/14   Erick Colace, NP  folic acid (FOLVITE) 1 MG tablet Take 1 tablet (1 mg total) by mouth daily. Patient not taking: Reported on 07/15/2014 05/26/14   Erick Colace, NP  ibuprofen (ADVIL,MOTRIN) 200 MG tablet Take 200-800 mg by mouth every 6 (six) hours as needed for headache.     [provider]  nicotine (NICODERM CQ - DOSED IN MG/24 HOURS) 14 mg/24hr patch Place 1 patch (14 mg total) onto the skin daily. Patient not taking: Reported on 07/15/2014 05/26/14   Erick Colace, NP  thiamine 100 MG tablet  Take 1 tablet (100 mg total) by mouth daily. Patient not taking: Reported on 07/15/2014 05/26/14   Simonne Martinet, NP    Allergies    Patient has no known allergies.  Review of Systems   Review of Systems  Constitutional:  Negative for activity change, appetite change and fever.  HENT:  Negative for congestion and rhinorrhea.   Respiratory:  Negative for cough, chest tightness and shortness of breath.   Gastrointestinal:  Negative for abdominal pain, nausea and vomiting.  Genitourinary:  Negative for dysuria and hematuria.  Musculoskeletal:  Positive for arthralgias and myalgias.  Skin:  Positive for wound.  Neurological:  Negative for  dizziness, weakness and headaches.   all other systems are negative except as noted in the HPI and PMH.   Physical Exam Updated Vital Signs BP (!) 130/108   Pulse 87   Temp 98.3 F (36.8 C) (Oral)   Resp 20   Ht 5\' 3"  (1.6 m)   Wt 61.2 kg   SpO2 100%   BMI 23.91 kg/m   Physical Exam Vitals and nursing note reviewed.  Constitutional:      General: She is not in acute distress.    Appearance: She is well-developed.  HENT:     Head: Normocephalic and atraumatic.     Mouth/Throat:     Pharynx: No oropharyngeal exudate.  Eyes:     Conjunctiva/sclera: Conjunctivae normal.     Pupils: Pupils are equal, round, and reactive to light.  Neck:     Comments: No meningismus. Cardiovascular:     Rate and Rhythm: Normal rate and regular rhythm.     Heart sounds: Normal heart sounds. No murmur heard. Pulmonary:     Effort: Pulmonary effort is normal. No respiratory distress.     Breath sounds: Normal breath sounds.  Abdominal:     Palpations: Abdomen is soft.     Tenderness: There is no abdominal tenderness. There is no guarding or rebound.  Musculoskeletal:        General: Tenderness and signs of injury present. Normal range of motion.     Cervical back: Normal range of motion and neck supple.     Comments: Laceration to volar surface of left index finger overlying DIP joint as depicted.  Flexion and extension are intact.  No weakness.  Distal sensation intact.  Radial pulse intact  Skin:    General: Skin is warm.  Neurological:     Mental Status: She is alert and oriented to person, place, and time.     Cranial Nerves: No cranial nerve deficit.     Motor: No abnormal muscle tone.     Coordination: Coordination normal.     Comments:  5/5 strength throughout. CN 2-12 intact.Equal grip strength.   Psychiatric:        Behavior: Behavior normal.    ED Results / Procedures / Treatments   Labs (all labs ordered are listed, but only abnormal results are displayed) Labs Reviewed -  No data to display  EKG None  Radiology DG Finger Index Left  Result Date: 04/24/2021 CLINICAL DATA:  Recent dog bite injury, initial encounter EXAM: LEFT INDEX FINGER 2+V COMPARISON:  None. FINDINGS: Soft tissue laceration is noted distally consistent with the given clinical history. No acute fracture or dislocation is noted. No radiopaque foreign body is noted. IMPRESSION: Soft tissue injury without acute bony abnormality. Electronically Signed   By: 14/08/2020 M.D.   On: 04/24/2021 01:11    Procedures Procedures  Medications Ordered in ED Medications  Tdap (BOOSTRIX) injection 0.5 mL (has no administration in time range)  amoxicillin-clavulanate (AUGMENTIN) 875-125 MG per tablet 1 tablet (has no administration in time range)    ED Course  I have reviewed the triage vital signs and the nursing notes.  Pertinent labs & imaging results that were available during my care of the patient were reviewed by me and considered in my medical decision making (see chart for details).    MDM Rules/Calculators/A&P                          Dog bite to left index finger.  Neurovascular intact. No other injuries. Vitals stable.   Will irrigate, update tetanus, x-ray, antibiotics  X-ray negative.  Discussed with Dr. Grandville Silos of hand surgery who reviewed x-ray as well as clinical images.  He agrees this wound does not need to be sutured and suturing would increase the risk of infection.  Patient agrees to leave wound open.  There is irrigated and cleaned.  Tetanus is updated and antibiotics will be provided.  Dr. Grandville Silos to see in office on Monday.  Discussed with patient.  She agrees to defer suturing at this time.  Wound irrigated and soaked.  Patient given tetanus updated and antibiotics.  Follow-up with Dr. Grandville Silos on Monday.  Wound care instructions given.  Return precautions discussed.   Final Clinical Impression(s) / ED Diagnoses Final diagnoses:  Dog bite, initial encounter     Rx / DC Orders ED Discharge Orders     None        Vivyan Biggers, Annie Main, MD 04/24/21 [phone]

## 2021-04-25 DIAGNOSIS — W540XXA Bitten by dog, initial encounter: Secondary | ICD-10-CM | POA: Diagnosis not present

## 2021-04-25 DIAGNOSIS — F410 Panic disorder [episodic paroxysmal anxiety] without agoraphobia: Secondary | ICD-10-CM | POA: Diagnosis not present

## 2021-04-25 DIAGNOSIS — S61258A Open bite of other finger without damage to nail, initial encounter: Secondary | ICD-10-CM | POA: Diagnosis not present

## 2021-04-25 DIAGNOSIS — F411 Generalized anxiety disorder: Secondary | ICD-10-CM | POA: Diagnosis not present

## 2021-05-19 DIAGNOSIS — N3000 Acute cystitis without hematuria: Secondary | ICD-10-CM | POA: Diagnosis not present

## 2021-05-19 DIAGNOSIS — J0100 Acute maxillary sinusitis, unspecified: Secondary | ICD-10-CM | POA: Diagnosis not present

## 2021-05-24 DIAGNOSIS — F411 Generalized anxiety disorder: Secondary | ICD-10-CM | POA: Diagnosis not present

## 2021-05-24 DIAGNOSIS — F410 Panic disorder [episodic paroxysmal anxiety] without agoraphobia: Secondary | ICD-10-CM | POA: Diagnosis not present

## 2021-05-24 DIAGNOSIS — I10 Essential (primary) hypertension: Secondary | ICD-10-CM | POA: Diagnosis not present

## 2021-06-08 DIAGNOSIS — F331 Major depressive disorder, recurrent, moderate: Secondary | ICD-10-CM | POA: Diagnosis not present

## 2021-06-08 DIAGNOSIS — F902 Attention-deficit hyperactivity disorder, combined type: Secondary | ICD-10-CM | POA: Diagnosis not present

## 2021-06-08 DIAGNOSIS — F411 Generalized anxiety disorder: Secondary | ICD-10-CM | POA: Diagnosis not present

## 2021-07-08 DIAGNOSIS — F902 Attention-deficit hyperactivity disorder, combined type: Secondary | ICD-10-CM | POA: Diagnosis not present

## 2021-07-08 DIAGNOSIS — F411 Generalized anxiety disorder: Secondary | ICD-10-CM | POA: Diagnosis not present

## 2021-07-08 DIAGNOSIS — F331 Major depressive disorder, recurrent, moderate: Secondary | ICD-10-CM | POA: Diagnosis not present

## 2021-07-12 DIAGNOSIS — R5383 Other fatigue: Secondary | ICD-10-CM | POA: Diagnosis not present

## 2021-07-12 DIAGNOSIS — Z03818 Encounter for observation for suspected exposure to other biological agents ruled out: Secondary | ICD-10-CM | POA: Diagnosis not present

## 2021-07-12 DIAGNOSIS — H8112 Benign paroxysmal vertigo, left ear: Secondary | ICD-10-CM | POA: Diagnosis not present

## 2021-07-19 DIAGNOSIS — F410 Panic disorder [episodic paroxysmal anxiety] without agoraphobia: Secondary | ICD-10-CM | POA: Diagnosis not present

## 2021-07-19 DIAGNOSIS — G43119 Migraine with aura, intractable, without status migrainosus: Secondary | ICD-10-CM | POA: Diagnosis not present

## 2021-07-19 DIAGNOSIS — F411 Generalized anxiety disorder: Secondary | ICD-10-CM | POA: Diagnosis not present

## 2021-07-19 DIAGNOSIS — N3281 Overactive bladder: Secondary | ICD-10-CM | POA: Diagnosis not present

## 2021-08-05 DIAGNOSIS — F411 Generalized anxiety disorder: Secondary | ICD-10-CM | POA: Diagnosis not present

## 2021-08-05 DIAGNOSIS — F331 Major depressive disorder, recurrent, moderate: Secondary | ICD-10-CM | POA: Diagnosis not present

## 2021-08-05 DIAGNOSIS — F902 Attention-deficit hyperactivity disorder, combined type: Secondary | ICD-10-CM | POA: Diagnosis not present

## 2021-08-31 DIAGNOSIS — R350 Frequency of micturition: Secondary | ICD-10-CM | POA: Diagnosis not present

## 2021-08-31 DIAGNOSIS — R351 Nocturia: Secondary | ICD-10-CM | POA: Diagnosis not present

## 2021-08-31 DIAGNOSIS — R1031 Right lower quadrant pain: Secondary | ICD-10-CM | POA: Diagnosis not present

## 2021-08-31 DIAGNOSIS — R8271 Bacteriuria: Secondary | ICD-10-CM | POA: Diagnosis not present

## 2021-08-31 DIAGNOSIS — N3020 Other chronic cystitis without hematuria: Secondary | ICD-10-CM | POA: Diagnosis not present

## 2021-09-06 DIAGNOSIS — F411 Generalized anxiety disorder: Secondary | ICD-10-CM | POA: Diagnosis not present

## 2021-09-06 DIAGNOSIS — F331 Major depressive disorder, recurrent, moderate: Secondary | ICD-10-CM | POA: Diagnosis not present

## 2021-09-06 DIAGNOSIS — F902 Attention-deficit hyperactivity disorder, combined type: Secondary | ICD-10-CM | POA: Diagnosis not present

## 2021-09-08 DIAGNOSIS — G4710 Hypersomnia, unspecified: Secondary | ICD-10-CM | POA: Diagnosis not present

## 2021-09-08 DIAGNOSIS — M2550 Pain in unspecified joint: Secondary | ICD-10-CM | POA: Diagnosis not present

## 2021-09-08 DIAGNOSIS — I10 Essential (primary) hypertension: Secondary | ICD-10-CM | POA: Diagnosis not present

## 2021-09-08 DIAGNOSIS — F411 Generalized anxiety disorder: Secondary | ICD-10-CM | POA: Diagnosis not present

## 2021-09-08 DIAGNOSIS — M791 Myalgia, unspecified site: Secondary | ICD-10-CM | POA: Diagnosis not present

## 2021-09-08 DIAGNOSIS — G43119 Migraine with aura, intractable, without status migrainosus: Secondary | ICD-10-CM | POA: Diagnosis not present

## 2021-09-09 DIAGNOSIS — F411 Generalized anxiety disorder: Secondary | ICD-10-CM | POA: Diagnosis not present

## 2021-09-20 ENCOUNTER — Encounter: Payer: Self-pay | Admitting: Neurology

## 2021-09-23 DIAGNOSIS — F411 Generalized anxiety disorder: Secondary | ICD-10-CM | POA: Diagnosis not present

## 2021-09-30 DIAGNOSIS — F411 Generalized anxiety disorder: Secondary | ICD-10-CM | POA: Diagnosis not present

## 2021-10-04 ENCOUNTER — Ambulatory Visit: Payer: BC Managed Care – PPO | Admitting: Neurology

## 2021-10-04 ENCOUNTER — Encounter: Payer: Self-pay | Admitting: Neurology

## 2021-10-04 VITALS — BP 96/62 | HR 61 | Ht <= 58 in | Wt 138.4 lb

## 2021-10-04 DIAGNOSIS — G478 Other sleep disorders: Secondary | ICD-10-CM | POA: Diagnosis not present

## 2021-10-04 DIAGNOSIS — G43711 Chronic migraine without aura, intractable, with status migrainosus: Secondary | ICD-10-CM | POA: Diagnosis not present

## 2021-10-04 DIAGNOSIS — G4719 Other hypersomnia: Secondary | ICD-10-CM

## 2021-10-04 MED ORDER — AJOVY 225 MG/1.5ML ~~LOC~~ SOAJ: 225.0000 mg | SUBCUTANEOUS | 11 refills | Status: DC

## 2021-10-04 NOTE — Patient Instructions (Signed)
Start Ajovy ?Narcolepsy panel ?Sleep team will call ?Video appt 3 months  ? ?Fremanezumab Injection ?What is this medication? ?FREMANEZUMAB (fre ma NEZ ue mab) prevents migraines. It works by blocking a substance in the body that causes migraines. It is a monoclonal antibody. ?This medicine may be used for other purposes; ask your health care provider or pharmacist if you have questions. ?COMMON BRAND NAME(S): AJOVY ?What should I tell my care team before I take this medication? ?They need to know if you have any of these conditions: ?An unusual or allergic reaction to fremanezumab, other medications, foods, dyes, or preservatives ?Pregnant or trying to get pregnant ?Breast-feeding ?How should I use this medication? ?This medication is injected under the skin. You will be taught how to prepare and give it. Take it as directed on the prescription label. Keep taking it unless your care team tells you to stop. ?It is important that you put your used needles and syringes in a special sharps container. Do not put them in a trash can. If you do not have a sharps container, call your pharmacist or care team to get one. ?Talk to your care team about the use of this medication in children. Special care may be needed. ?Overdosage: If you think you have taken too much of this medicine contact a poison control center or emergency room at once. ?NOTE: This medicine is only for you. Do not share this medicine with others. ?What if I miss a dose? ?If you miss a dose, take it as soon as you can. If it is almost time for your next dose, take only that dose. Do not take double or extra doses. ?What may interact with this medication? ?Interactions are not expected. ?This list may not describe all possible interactions. Give your health care provider a list of all the medicines, herbs, non-prescription drugs, or dietary supplements you use. Also tell them if you smoke, drink alcohol, or use illegal drugs. Some items may interact with  your medicine. ?What should I watch for while using this medication? ?Tell your care team if your symptoms do not start to get better or if they get worse. ?What side effects may I notice from receiving this medication? ?Side effects that you should report to your care team as soon as possible: ?Allergic reactions or angioedema--skin rash, itching or hives, swelling of the face, eyes, lips, tongue, arms, or legs, trouble swallowing or breathing ?Side effects that usually do not require medical attention (report to your care team if they continue or are bothersome): ?Pain, redness, or irritation at injection site ?This list may not describe all possible side effects. Call your doctor for medical advice about side effects. You may report side effects to FDA at 1-800-FDA-1088. ?Where should I keep my medication? ?Keep out of the reach of children and pets. ?Store in a refrigerator or at room temperature between 20 and 25 degrees C (68 and 77 degrees F). ?Refrigeration (preferred): Store in the refrigerator. Do not freeze. Keep in the original container until you are ready to take it. Remove the dose from the carton about 30 minutes before it is time for you to use it. If the dose is not used, it may be stored in the original container at room temperature for 7 days. Get rid of any unused medication after the expiration date. ?Room Temperature: This medication may be stored at room temperature for up to 7 days. Keep it in the original container. Protect from light until time of  use. If it is stored at room temperature, get rid of any unused medication after 7 days or after it expires, whichever is first. ?To get rid of medications that are no longer needed or have expired: ?Take the medication to a medication take-back program. Check with your pharmacy or law enforcement to find a location. ?If you cannot return the medication, ask your pharmacist or care team how to get rid of this medication safely. ?NOTE: This sheet  is a summary. It may not cover all possible information. If you have questions about this medicine, talk to your doctor, pharmacist, or health care provider. ?? 2023 Elsevier/Gold Standard (2021-07-01 00:00:00) ? ?

## 2021-10-04 NOTE — Progress Notes (Signed)
?[facility] ? ? ? ?Provider:  Dr Jaynee Eagles ?Requesting Provider: [name], NP ?Primary Care Provider:  [name], NP ? ?CC:  migraines ? ?HPI:  Misty Ross is a 31 y.o. female here as requested by [name], NP for intractable migraines.  History of overactive bladder, panic attacks, tobacco use, anxiety, essential hypertension, excessive sleepiness, intractable migraine with aura without status migrainosus, anxiety, panic attacks, has a psychiatrist and therapist.  I reviewed Misty Ross notes which states patient is having dizziness, fatigue, tinnitus and migraines, she also has a history of significant anxiety, on her last appointment on September 08, 2021 they discussed the dizziness and migraines, her lab work in February 2023 were unremarkable, no evidence anemia, electrolyte imbalance, infection or thyroid disorders.  She was seen in [place] by neurologist.  She was previously giving samples by neurologist for prophylactic medication but never went back, she recently quit her job because of significant fatigue and excessive sleepiness.  She feels that she can sleep all the time, migraines lasting for days, she has daily migraines and they are constant and her vision is blurry, she does not take any medication for her migraines, she was last told by her neurologist not to take daily medications (I suspect this was analgesics and not prevention) she will periodically take over-the-counter medications and states this helps much, her last eye exam was 5 years ago and she sporadically wears glasses, she is in bed "all of the time and unable to work" per her mother, she requested temporary disability from work and mother is overwhelmed because he is paying on the bills for the daughter, she also reports joint stiffness.  She also has a psychiatrist and has seen them, she is taking half a Klonopin in the evenings and also think she has fibromyalgia I reviewed general examination  which was normal and neurologic examination which was normal.  As an aside patient's "passing out" is laying in bed and falling asleep without realizing it, primary care did not think that this warranted aggressive evaluation at this time could be caused by medication she is taking in regular sleep schedule such as Klonopin and trazodone. ? ?Per patient: Started having migraines around the age of 88. Migraines start behind her left eye, no precipitating events, occurring daily, worsening, start behind the left eye and migrate to the left temporal lobe, can occur on the right side of her head as well, persistent blurry vision of her left eye regardless of headaches(better with glasses, but she does get blurry vision with the headaches/migraines, states she saw an optometrist and had an exam, she reports severe headaches every day with photophobia, phonophobia and nausea vomiting, excessive fatigue,  hurts to move, sitting in a dark and pillow over eyes, and mood impairment because of headaches and not being able to work.  Migraines can be 10 out of 10 in pain and they are at least moderate to severe. They can last for days, weather affects her. She stopped taking fioricet and goodys. HUrts to move. Also has vertigo, she gets dizzy with and without migraines, worse when bending over,has morning headaches. Misty Ross and nurtec works well. No other focal neurologic deficits, associated symptoms, inciting events or modifiable factors. Father is with her and provides much information as well. ? ?Sleeping a lot, she can sleep for days and awake for a few hours just to eat and then goes back to bed, unknown if snoring, excessive daytime somnolence, can fall asleep in a lobby, she  worries about driving too long and falling asleep, worsening, ongoing and worsening since February he sleeps excessive amounts.  ? ?Reviewed notes, labs and imaging from outside physicians, which showed: ?I reviewed a note from neurology Dr.  Juanito Doom, frequent headaches caused her to lose her job, but 2 years ago she started getting headaches and found out that she had hypertension, she was started on medication for blood pressure which helped the headaches for a while but over the last 2 months she has had a headache every day (this is from an appointment in February 2021) PCP tried Fioricet, she reported at this appointment frequent headaches bilateral temples or behind eyes, primarily left side most severe patient describes as a band around her head, throbbing patient's most severe headaches and given a pain score of 10 out of 10, headaches are associated with phonophobia, photophobia, nausea, vomiting, presence of aura. ? ?Migraines start behind her left eye, no precipitating events, occurring daily, start behind the left eye and migrate to the left temporal lobe, can occur on the right side of her head as well, persistent blurry vision of her left eye regardless of headaches, states she saw an optometrist for this who said she had "scarring" of her eye from high blood pressure, she reports severe headaches every day with photophobia, phonophobia and nausea vomiting, excessive fatigue, and mood impairment because of headaches and not being able to work.  Migraines can be 10 out of 10 in pain and they are at least moderate to severe.  Taking Lopressor for elevated blood pressure which is also a migraine medication.  She often wakes up with a pounding sensation in her chest, she does not check her blood pressure at home, at the time she was taking Fioricet every 4 hours and she had tried over-the-counter NSAIDs with no relief and Dr. Juanito Doom did discuss medication overuse headache with Fioricet and patient has stopped taking that apparently.  They tried Topamax and a prednisone taper to help while she decreases Fioricet.  MRI of the brain was normal.  A referral was placed to cardiology for hypertension work-up and complaining of chest pain.  She was  started on topiramate and given Ubrelvy and Nurtec samples and a Medrol Dosepak.  It appears she went twice last appointment July 24, 2019 and did not return. ? ?Taken by primary care include thyroid, CBC with differential, CMP, B12, vitamin D, ESR, ANA, CRP, CPK: Drawn in February 2023, TSH was normal, vitamin D was low at 23.8, CMP was unremarkable with BUN 19 and creatinine 0.97, CBC was unremarkable, B12 425, sed rate normal 20, CRP 5 normal, CK 57 normal, ANA IFA negative. ? ?From a thorough review of records, medications tried that can be used in migraine management include metoprolol(>6 months), trazodone, Lamictal, escitalopram oxylate, Fioricet, Goody powder, Excedrin, ibuprofen, samples of Ubrelvy and Nurtec, nortriptyline(side effects), topiramate (> 6 months did not work) ? ?I reviewed MRI of the brain order date 07/18/2019 I have reports no images but notes say MRI negative for any acute or abnormal findings, result normal.  Referring doctor was Verlin Grills.  Dr. 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 ?Review of Systems: ?Patient complains of symptoms per HPI as well as the following symptoms excessive fatigue, vertigo. Pertinent negatives and positives per HPI. All others negative. ? ? ?Social History  ? ?  Socioeconomic History  ? Marital status: Single  ?  Spouse name: Not on file  ? Number of children: Not on file  ? Years of education: Not on file  ? Highest education level: Not on file  ?Occupational History  ? Not on file  ?Tobacco Use  ? Smoking status: Every Day  ?  Packs/day: 0.50  ?  Types: Cigarettes  ? Smokeless tobacco: Not on file  ?Substance and Sexual Activity  ? Alcohol use: Yes  ?  Comment: one beer per day  ? 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 Sexual activity: Not on file  ?Other Topics Concern  ? Not on  file  ?Social History Narrative  ? Not on file  ? ?Social Determinants of Health  ? ?Financial Resource Strain: Not on file  ?Food Insecurity: Not on file  ?Transportation Needs: Not on file  ?Physical Activi

## 2021-10-06 DIAGNOSIS — R351 Nocturia: Secondary | ICD-10-CM | POA: Diagnosis not present

## 2021-10-06 DIAGNOSIS — R350 Frequency of micturition: Secondary | ICD-10-CM | POA: Diagnosis not present

## 2021-10-06 DIAGNOSIS — R8271 Bacteriuria: Secondary | ICD-10-CM | POA: Diagnosis not present

## 2021-10-11 DIAGNOSIS — R350 Frequency of micturition: Secondary | ICD-10-CM | POA: Diagnosis not present

## 2021-10-11 DIAGNOSIS — M62838 Other muscle spasm: Secondary | ICD-10-CM | POA: Diagnosis not present

## 2021-10-11 DIAGNOSIS — R102 Pelvic and perineal pain: Secondary | ICD-10-CM | POA: Diagnosis not present

## 2021-10-14 LAB — NARCOLEPSY EVALUATION
DQA1*01:02: NEGATIVE
DQB1*06:02: NEGATIVE

## 2021-10-18 ENCOUNTER — Telehealth: Payer: Self-pay | Admitting: *Deleted

## 2021-10-18 NOTE — Telephone Encounter (Signed)
Called pt & LVM (ok per DPR) advising patient that her narcolepsy panel is negative. Sleep studies team is to call her for appt scheduling for consultation. Left office number for pt to call back if she doesn't hear within 2 weeks.

## 2021-10-18 NOTE — Telephone Encounter (Signed)
-----   Message from Melvenia Beam, MD sent at 10/14/2021  6:00 PM EDT ----- Narcolepsy panel negative. I referred her to sleep studies.

## 2021-11-08 ENCOUNTER — Telehealth: Payer: Self-pay

## 2021-11-08 DIAGNOSIS — F331 Major depressive disorder, recurrent, moderate: Secondary | ICD-10-CM | POA: Diagnosis not present

## 2021-11-08 DIAGNOSIS — F411 Generalized anxiety disorder: Secondary | ICD-10-CM | POA: Diagnosis not present

## 2021-11-08 NOTE — Telephone Encounter (Signed)
PA submitted for Ajovy via covermymeds (Key: [id number])  Your information has been submitted to [facility]. [employer] [place] will review the request and notify you of the determination decision directly, typically within 72 hours of receiving all information.  You will also receive your request decision electronically. To check for an update later, open this request again from your dashboard.  If [employer] [place] has not responded within the specified timeframe or if you have any questions about your PA submission, contact [employer] [place] directly at [phone].

## 2021-11-10 DIAGNOSIS — R6883 Chills (without fever): Secondary | ICD-10-CM | POA: Diagnosis not present

## 2021-11-10 DIAGNOSIS — J029 Acute pharyngitis, unspecified: Secondary | ICD-10-CM | POA: Diagnosis not present

## 2021-11-17 DIAGNOSIS — R350 Frequency of micturition: Secondary | ICD-10-CM | POA: Diagnosis not present

## 2021-11-17 DIAGNOSIS — N3020 Other chronic cystitis without hematuria: Secondary | ICD-10-CM | POA: Diagnosis not present

## 2021-11-24 DIAGNOSIS — M6281 Muscle weakness (generalized): Secondary | ICD-10-CM | POA: Diagnosis not present

## 2021-11-24 DIAGNOSIS — M6289 Other specified disorders of muscle: Secondary | ICD-10-CM | POA: Diagnosis not present

## 2021-11-24 DIAGNOSIS — M62838 Other muscle spasm: Secondary | ICD-10-CM | POA: Diagnosis not present

## 2021-11-24 DIAGNOSIS — R3915 Urgency of urination: Secondary | ICD-10-CM | POA: Diagnosis not present

## 2021-12-15 DIAGNOSIS — E559 Vitamin D deficiency, unspecified: Secondary | ICD-10-CM | POA: Diagnosis not present

## 2021-12-29 DIAGNOSIS — M62838 Other muscle spasm: Secondary | ICD-10-CM | POA: Diagnosis not present

## 2021-12-29 DIAGNOSIS — M6281 Muscle weakness (generalized): Secondary | ICD-10-CM | POA: Diagnosis not present

## 2021-12-29 DIAGNOSIS — R3915 Urgency of urination: Secondary | ICD-10-CM | POA: Diagnosis not present

## 2021-12-29 DIAGNOSIS — M6289 Other specified disorders of muscle: Secondary | ICD-10-CM | POA: Diagnosis not present

## 2022-01-09 ENCOUNTER — Encounter: Payer: Self-pay | Admitting: Neurology

## 2022-01-09 ENCOUNTER — Telehealth: Payer: Self-pay | Admitting: Neurology

## 2022-01-09 ENCOUNTER — Telehealth (INDEPENDENT_AMBULATORY_CARE_PROVIDER_SITE_OTHER): Payer: BC Managed Care – PPO | Admitting: Neurology

## 2022-01-09 DIAGNOSIS — G43009 Migraine without aura, not intractable, without status migrainosus: Secondary | ICD-10-CM | POA: Insufficient documentation

## 2022-01-09 MED ORDER — ONDANSETRON 4 MG PO TBDP: 4.0000 mg | ORAL_TABLET | Freq: Three times a day (TID) | ORAL | 3 refills | Status: DC | PRN

## 2022-01-09 MED ORDER — NURTEC 75 MG PO TBDP: 75.0000 mg | ORAL_TABLET | Freq: Every day | ORAL | 11 refills | Status: DC | PRN

## 2022-01-09 MED ORDER — RIZATRIPTAN BENZOATE 10 MG PO TBDP: 10.0000 mg | ORAL_TABLET | ORAL | 11 refills | Status: DC | PRN

## 2022-01-09 NOTE — Progress Notes (Signed)
[facility]    Provider:  Dr Jaynee Eagles Requesting Provider: [name], NP Primary Care Provider:  [name], NP  CC:  migraines  Virtual Visit via Video Note  I connected with Kashina Mecum on 01/09/22 at  2:00 PM EDT by a video enabled telemedicine application and verified that I am speaking with the correct person using two identifiers.  Location: Patient: home Provider: office   I discussed the limitations of evaluation and management by telemedicine and the availability of in person appointments. The patient expressed understanding and agreed to proceed.   Follow Up Instructions:    I discussed the assessment and treatment plan with the patient. The patient was provided an opportunity to ask questions and all were answered. The patient agreed with the plan and demonstrated an understanding of the instructions.   The patient was advised to call back or seek an in-person evaluation if the symptoms worsen or if the condition fails to improve as anticipated.  I provided 30 minutes of non-face-to-face time during this encounter.   Melvenia Beam, MD   01/09/2022: Follow up for migraines and fatigue. Narcolepsy panel negative. Ajovy approved. Sleep team consulted for fatigue. The Ajovy shot has been fantastic. She is still getting a few headaches but overall everything is excellent. When she does get a migraine at lest 50% improved and frequency is > 50% improved. Nurtec worked great, had samples, discussed acute management. Has 6 migraine days a month now and < 10 total headache days a month so will prescribe Nurtec. Also only been on Ajovy for 3 months, maximum efficacy in 6 months so will continue to get better. No other focal neurologic deficits, associated symptoms, inciting events or modifiable factors. Answered questions, discussed acute medications options, had samples of nurtec and ubrelvy and nurtec worked better.  Patient complains of symptoms per  HPI as well as the following symptoms: migraines . Pertinent negatives and positives per HPI. All others negative   HPI 10/04/2021:  Misty Ross is a 31 y.o. female here as requested by [name], NP for intractable migraines.  History of overactive bladder, drug abuse, panic attacks, tobacco use, anxiety, essential hypertension, excessive sleepiness, intractable migraine with aura without status migrainosus, anxiety, panic attacks, has a psychiatrist and therapist.  I reviewed [name] notes which states patient is having dizziness, fatigue, tinnitus and migraines, she also has a history of significant anxiety, on her last appointment on September 08, 2021 they discussed the dizziness and migraines, her lab work in February 2023 were unremarkable, no evidence anemia, electrolyte imbalance, infection or thyroid disorders.  She was seen in [place] by neurologist.  She was previously giving samples by neurologist for prophylactic medication but never went back, she recently quit her job because of significant fatigue and excessive sleepiness.  She feels that she can sleep all the time, migraines lasting for days, she has daily migraines and they are constant and her vision is blurry, she does not take any medication for her migraines, she was last told by her neurologist not to take daily medications (I suspect this was analgesics and not prevention) she will periodically take over-the-counter medications and states this helps much, her last eye exam was 5 years ago and she sporadically wears glasses, she is in bed "all of the time and unable to work" per her mother, she requested temporary disability from work and mother is overwhelmed because he is paying on the bills for the daughter, she also reports joint stiffness.  She also has a psychiatrist and has seen them, she is taking half a Klonopin in the evenings and also think she has fibromyalgia I reviewed general examination which was normal and  neurologic examination which was normal.  As an aside patient's "passing out" is laying in bed and falling asleep without realizing it, primary care did not think that this warranted aggressive evaluation at this time could be caused by medication she is taking in regular sleep schedule such as Klonopin and trazodone.  Per patient: Started having migraines around the age of 74. Migraines start behind her left eye, no precipitating events, occurring daily, worsening, start behind the left eye and migrate to the left temporal lobe, can occur on the right side of her head as well, persistent blurry vision of her left eye regardless of headaches(better with glasses, but she does get blurry vision with the headaches/migraines, states she saw an optometrist and had an exam, she reports severe headaches every day with photophobia, phonophobia and nausea vomiting, excessive fatigue,  hurts to move, sitting in a dark and pillow over eyes, and mood impairment because of headaches and not being able to work.  Migraines can be 10 out of 10 in pain and they are at least moderate to severe. They can last for days, weather affects her. She stopped taking fioricet and goodys. HUrts to move. Also has vertigo, she gets dizzy with and without migraines, worse when bending over,has morning headaches. Roselyn Meier and nurtec works well. No other focal neurologic deficits, associated symptoms, inciting events or modifiable factors. Father is with her and provides much information as well.  Sleeping a lot, she can sleep for days and awake for a few hours just to eat and then goes back to bed, unknown if snoring, excessive daytime somnolence, can fall asleep in a lobby, she worries about driving too long and falling asleep, worsening, ongoing and worsening since February he sleeps excessive amounts.   Reviewed notes, labs and imaging from outside physicians, which showed: I reviewed a note from neurology Dr. Juanito Doom, frequent headaches  caused her to lose her job, but 2 years ago she started getting headaches and found out that she had hypertension, she was started on medication for blood pressure which helped the headaches for a while but over the last 2 months she has had a headache every day (this is from an appointment in February 2021) PCP tried Fioricet, she reported at this appointment frequent headaches bilateral temples or behind eyes, primarily left side most severe patient describes as a band around her head, throbbing patient's most severe headaches and given a pain score of 10 out of 10, headaches are associated with phonophobia, photophobia, nausea, vomiting, presence of aura.  Migraines start behind her left eye, no precipitating events, occurring daily, start behind the left eye and migrate to the left temporal lobe, can occur on the right side of her head as well, persistent blurry vision of her left eye regardless of headaches, states she saw an optometrist for this who said she had "scarring" of her eye from high blood pressure, she reports severe headaches every day with photophobia, phonophobia and nausea vomiting, excessive fatigue, and mood impairment because of headaches and not being able to work.  Migraines can be 10 out of 10 in pain and they are at least moderate to severe.  Taking Lopressor for elevated blood pressure which is also a migraine medication.  She often wakes up with a pounding sensation in her chest, she  does not check her blood pressure at home, at the time she was taking Fioricet every 4 hours and she had tried over-the-counter NSAIDs with no relief and Dr. Juanito Doom did discuss medication overuse headache with Fioricet and patient has stopped taking that apparently.  They tried Topamax and a prednisone taper to help while she decreases Fioricet.  MRI of the brain was normal.  A referral was placed to cardiology for hypertension work-up and complaining of chest pain.  She was started on topiramate and  given Ubrelvy and Nurtec samples and a Medrol Dosepak.  It appears she went twice last appointment July 24, 2019 and did not return.  Taken by primary care include thyroid, CBC with differential, CMP, B12, vitamin D, ESR, ANA, CRP, CPK: Drawn in February 2023, TSH was normal, vitamin D was low at 23.8, CMP was unremarkable with BUN 19 and creatinine 0.97, CBC was unremarkable, B12 425, sed rate normal 20, CRP 5 normal, CK 57 normal, ANA IFA negative.  From a thorough review of records, medications tried that can be used in migraine management include metoprolol(>6 months), trazodone, Lamictal, escitalopram oxylate, Fioricet, Goody powder, Excedrin, ibuprofen, samples of Ubrelvy and Nurtec, nortriptyline(side effects), topiramate (> 6 months did not work), sumatriptan, rizatriptan  I reviewed MRI of the brain order date 07/18/2019 I have reports no images but notes say MRI negative for any acute or abnormal findings, result normal.  Referring doctor was Verlin Grills.  Dr. Thad Ranger neurologic exam was normal diagnosed with chronic migraine without aura, essential hypertension, Topamax (was on it for more than 3 months did not notice any improvements), prednisone taper (did not help)  Review of Systems: Patient complains of symptoms per HPI as well as the following symptoms excessive fatigue, vertigo. Pertinent negatives and positives per HPI. All others negative.   Social History   Socioeconomic History   Marital status: Single    Spouse name: Not on file   Number of children: Not on file   Years of education: Not on file   Highest education level: Not on file  Occupational History   Not on file  Tobacco Use   Smoking status: Every Day    Packs/day: 0.50    Types: Cigarettes   Smokeless tobacco: Not on file  Substance and Sexual Activity   Alcohol use: Yes    Comment: one beer per day   Drug use: Yes    Comment: heroine   Sexual activity: Not on file  Other Topics Concern   Not on  file  Social History Narrative   Not on file   Social Determinants of Health   Financial Resource Strain: Not on file  Food Insecurity: Not on file  Transportation Needs: Not on file  Physical Activity: Not on file  Stress: Not on file  Social Connections: Not on file  Intimate Partner Violence: Not on file    Family History  Problem Relation Age of Onset   Hyperlipidemia Father    Migraines Father    Multiple sclerosis Brother    Cancer Maternal Grandmother    Diabetes Maternal Grandmother    Cancer Maternal Grandfather    Heart disease Paternal Grandmother    Cancer Paternal Grandfather     Past Medical History:  Diagnosis Date   ADHD (attention deficit hyperactivity disorder)    Allergy    Anxiety    Depression    Migraine    Seizures ([place])     Patient Active Problem List   Diagnosis Date  Noted   Migraine without aura and without status migrainosus, not intractable 01/09/2022   Depression, major, recurrent ([place]) 07/17/2014   Suicide ideation 07/17/2014   Polysubstance dependence ([place]) 07/17/2014   Major depressive disorder, recurrent, severe without psychotic features ([place])    Aspiration pneumonia ([place]) 05/24/2014   Acute encephalopathy 05/23/2014   Suicidal ideation 05/23/2014   Polysubstance abuse ([place]) 05/23/2014   Acute respiratory failure with hypoxemia ([place]) 05/23/2014   Suicide attempt ([place]) 05/23/2014   Drug overdose, intentional ([place])    Tobacco use disorder 03/28/2007   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 Great for nausea or motion sickness. Can also take it with Rizatriptan or  nurtec for migraines 30 tablet 3   Rimegepant Sulfate (NURTEC) 75 MG TBDP Take 75 mg by mouth daily as needed. For migraines. Take as close to onset of migraine as possible. One daily maximum. 16 tablet 11   rizatriptan (MAXALT-MLT) 10 MG disintegrating tablet Take 1 tablet (10 mg total) by mouth as needed for migraine. May repeat in 2 hours if needed. Take right at onset of migraine 9 tablet 11   clonazePAM (KLONOPIN) 0.5 MG tablet Take 0.5 mg by mouth as needed.     escitalopram (LEXAPRO) 20 MG tablet Take 20 mg by mouth daily.     Fremanezumab-vfrm (AJOVY) 225 MG/1.5ML SOAJ Inject 225 mg into the skin every 30 (thirty) days. 1 mL 11   lamoTRIgine (LAMICTAL) 25 MG tablet 1 tablet     metoprolol tartrate (LOPRESSOR) 50 MG tablet Take 75 mg by mouth daily.     norethindrone (MICRONOR) 0.35 MG tablet Take 1 tablet by mouth daily.     Vitamin D, Ergocalciferol, (DRISDOL) 1.25 MG (50000 UNIT) CAPS capsule Take 50,000 Units by mouth once a week.     No current facility-administered medications for this visit.    Allergies as of 01/09/2022   (No Known Allergies)    Vitals: There were no vitals taken for this visit. Last Weight:  Wt Readings from Last 1 Encounters:  10/04/21 138 lb 6.4 oz (62.8 kg)   Last Height:   Ht Readings from Last 1 Encounters:  10/04/21 4' 10"  (1.473 m)     Physical exam: Exam: Gen: NAD, conversant      CV: Denies palpitations or chest pain or SOB. VS: Breathing at a normal rate. Weight appears within normal limits. Not febrile. Eyes: Conjunctivae clear without exudates or hemorrhage  Neuro: Detailed Neurologic Exam  Speech:    Speech is normal; fluent and spontaneous with normal comprehension.  Cognition:    The patient is oriented to person, place, and time;     recent and remote memory intact;     language fluent;     normal attention, concentration,     fund of knowledge Cranial Nerves:    The pupils are equal, round, and reactive to light.  Cannot perform fundoscopic exam. Visual fields are full to finger confrontation. Extraocular movements are intact.  The face is symmetric with normal sensation. The palate elevates in the midline. Hearing intact. Voice is normal. Shoulder shrug is normal. The tongue has normal motion without fasciculations.   Coordination:  Normal finger to nose  Gait:    Normal native gait  Motor Observation:   no involuntary movements noted. Tone:    Appears normal  Posture:    Posture is normal. normal erect    Strength:    Strength is anti-gravity and symmetric in the upper and lower limbs.      Sensation: intact to LT     Assessment/Plan:  Patient with intractable chronic migraines and excessive fatigue. Baseline daily severe migraine and headaches.   - The Ajovy shot has been fantastic. She is still getting a few headaches but overall everything is excellent. When she does get a migraine at least 50% improved and frequency is > 50% improved. Nurtec worked great, had samples, discussed acute management. Has 6 migraine days a month now and < 10 total headache days a month so will prescribe Nurtec. Also only been on Ajovy for 3 months, maximum efficacy in 6 months so will continue to get better -Remainder of the headaches can take nurtec as needed when you have a headache there is also an indication for every other day use fyi -Can also try Rizatriptan acutely again: Please take one tablet at the onset of your headache. If it does not improve the symptoms please take one additional tablet. Do not take more then 2 tablets in 24hrs. Do not take use more then 2 to 3 times in a week. -Ondansetron: Great for nausea or motion sickness. Can also take it with Rizatriptan or nurtec - Continue the Ajovy for prevention once monthly   Sleep evaluation: Narcolepsy panel negative. Ajovy approved. Sleep team consulted for fatigue. Sleeping a lot, she can sleep for days and awake for a few hours just to eat and  then goes back to bed, unknown if snoring, excessive daytime somnolence, can fall asleep in a lobby, she worries about driving too long and falling asleep, worsening, ongoing for years since before 2021, and worsening since February she sleeps excessive amounts for days on end. She wakes up and her arms are frozen, very vivid dreams, she has not lost tone but she has sleep paralysis and used to be scary for her.   From a thorough review of records, medications tried that can be used in migraine management include metoprolol(>6 months), trazodone, Lamictal, escitalopram oxylate, Fioricet, Goody powder, Excedrin, ibuprofen, samples of Ubrelvy and Nurtec, nortriptyline(side effects), topiramate (> 6 months did not work), sumatriptan, rizatriptan  Meds ordered this encounter  Medications   Rimegepant Sulfate (NURTEC) 75 MG TBDP    Sig: Take 75 mg by mouth daily as needed. For migraines. Take as close to onset of migraine as possible. One daily maximum.    Dispense:  16 tablet    Refill:  11   rizatriptan (MAXALT-MLT) 10 MG disintegrating tablet    Sig: Take 1 tablet (10 mg total) by mouth as needed for migraine. May repeat in 2 hours if needed. Take right at onset of migraine    Dispense:  9 tablet    Refill:  11   ondansetron (ZOFRAN-ODT) 4 MG disintegrating tablet    Sig: Take 1-2 tablets (4-8 mg total) by mouth every 8 (eight) hours as needed. Great for nausea or motion sickness. Can also take it with Rizatriptan or nurtec for migraines    Dispense:  30 tablet    Refill:  3   Discussed: To prevent or relieve headaches, try the following: Cool Compress. Lie down and place a cool compress on your head.  Avoid  headache triggers. If certain foods or odors seem to have triggered your migraines in the past, avoid them. A headache diary might help you identify triggers.  Include physical activity in your daily routine. Try a daily walk or other moderate aerobic exercise.  Manage stress. Find healthy  ways to cope with the stressors, such as delegating tasks on your to-do list.  Practice relaxation techniques. Try deep breathing, yoga, massage and visualization.  Eat regularly. Eating regularly scheduled meals and maintaining a healthy diet might help prevent headaches. Also, drink plenty of fluids.  Follow a regular sleep schedule. Sleep deprivation might contribute to headaches Consider biofeedback. With this mind-body technique, you learn to control certain bodily functions -- such as muscle tension, heart rate and blood pressure -- to prevent headaches or reduce headache pain.    Proceed to emergency room if you experience new or worsening symptoms or symptoms do not resolve, if you have new neurologic symptoms or if headache is severe, or for any concerning symptom.   Cc: [name], NP,  [name], NP  Sarina Ill, MD  [facility] [address] [place], [place] [address]  Phone [phone] Fax [phone]

## 2022-01-09 NOTE — Telephone Encounter (Signed)
Scheduled pt for VV with Amy on 05/09/22 at 2:00 pm.

## 2022-01-09 NOTE — Telephone Encounter (Signed)
Jillian or Denny Peon, Can you please call patient and set her up with a 4 month follow up with megan millikan or amy lomax please for migraines? Would you mind? Can be a 30 minute video call thanks

## 2022-01-09 NOTE — Patient Instructions (Addendum)
-Remainder of the headaches can take nurtec as needed when you have a headache BUT I recommend  take every other day -Can also try Rizatriptan again: Please take one tablet at the onset of your headache. If it does not improve the symptoms please take one additional tablet. Do not take more then 2 tablets in 24hrs. Do not take use more then 2 to 3 times in a week. -Ondansetron: Great for nausea or motion sickness. Can also take it with Rizatriptan or nurtec - Continue the Ajovy for prevention once monthly   Rimegepant Disintegrating Tablets What is this medication? RIMEGEPANT (ri ME je pant) prevents and treats migraines. It works by blocking a substance in the body that causes migraines. This medicine may be used for other purposes; ask your health care provider or pharmacist if you have questions. COMMON BRAND NAME(S): NURTEC ODT What should I tell my care team before I take this medication? They need to know if you have any of these conditions: Kidney disease Liver disease An unusual or allergic reaction to rimegepant, other medications, foods, dyes, or preservatives Pregnant or trying to get pregnant Breast-feeding How should I use this medication? Take this medication by mouth. Take it as directed on the prescription label. Leave the tablet in the sealed pack until you are ready to take it. With dry hands, open the pack and gently remove the tablet. If the tablet breaks or crumbles, throw it away. Use a new tablet. Place the tablet in the mouth and allow it to dissolve. Then, swallow it. Do not cut, crush, or chew this medication. You do not need water to take this medication. Talk to your care team about the use of this medication in children. Special care may be needed. Overdosage: If you think you have taken too much of this medicine contact a poison control center or emergency room at once. NOTE: This medicine is only for you. Do not share this medicine with others. What if I miss a  dose? This does not apply. This medication is not for regular use. What may interact with this medication? Certain medications for fungal infections, such as fluconazole, itraconazole Rifampin This list may not describe all possible interactions. Give your health care provider a list of all the medicines, herbs, non-prescription drugs, or dietary supplements you use. Also tell them if you smoke, drink alcohol, or use illegal drugs. Some items may interact with your medicine. What should I watch for while using this medication? Visit your care team for regular checks on your progress. Tell your care team if your symptoms do not start to get better or if they get worse. What side effects may I notice from receiving this medication? Side effects that you should report to your care team as soon as possible: Allergic reactions--skin rash, itching, hives, swelling of the face, lips, tongue, or throat Side effects that usually do not require medical attention (report to your care team if they continue or are bothersome): Nausea Stomach pain This list may not describe all possible side effects. Call your doctor for medical advice about side effects. You may report side effects to FDA at 1-800-FDA-1088. Where should I keep my medication? Keep out of the reach of children and pets. Store at room temperature between 20 and 25 degrees C (68 and 77 degrees F). Get rid of any unused medication after the expiration date. To get rid of medications that are no longer needed or have expired: Take the medication to a medication  take-back program. Check with your pharmacy or law enforcement to find a location. If you cannot return the medication, check the label or package insert to see if the medication should be thrown out in the garbage or flushed down the toilet. If you are not sure, ask your care team. If it is safe to put it in the trash, take the medication out of the container. Mix the medication with cat  litter, dirt, coffee grounds, or other unwanted substance. Seal the mixture in a bag or container. Put it in the trash. NOTE: This sheet is a summary. It may not cover all possible information. If you have questions about this medicine, talk to your doctor, pharmacist, or health care provider.  2023 Elsevier/Gold Standard (2021-06-29 00:00:00) Rizatriptan Disintegrating Tablets What is this medication? RIZATRIPTAN (rye za TRIP tan) treats migraines. It works by blocking pain signals and narrowing blood vessels in the brain. It belongs to a group of medications called triptans. It is not used to prevent migraines. This medicine may be used for other purposes; ask your health care provider or pharmacist if you have questions. COMMON BRAND NAME(S): Maxalt-MLT What should I tell my care team before I take this medication? They need to know if you have any of these conditions: Circulation problems in fingers and toes Diabetes Heart disease High blood pressure High cholesterol History of irregular heartbeat History of stroke Stomach or intestine problems Tobacco use An unusual or allergic reaction to rizatriptan, other medications, foods, dyes, or preservatives Pregnant or trying to get pregnant Breast-feeding How should I use this medication? Take this medication by mouth. Take it as directed on the prescription label. You do not need water to take this medication. Leave the tablet in the sealed pack until you are ready to take it. With dry hands, open the pack and gently remove the tablet. If the tablet breaks or crumbles, throw it away. Use a new tablet. Place the tablet on the tongue and allow it to dissolve. Then, swallow it. Do not cut, crush, or chew this medication. Do not use it more often than directed. Talk to your care team about the use of this medication in children. While it may be prescribed for children as young as 6 years for selected conditions, precautions do  apply. Overdosage: If you think you have taken too much of this medicine contact a poison control center or emergency room at once. NOTE: This medicine is only for you. Do not share this medicine with others. What if I miss a dose? This does not apply. This medication is not for regular use. What may interact with this medication? Do not take this medication with any of the following: Ergot alkaloids, such as dihydroergotamine, ergotamine MAOIs, such as Marplan, Nardil, Parnate Other medications for migraine headache, such as almotriptan, eletriptan, frovatriptan, naratriptan, sumatriptan, zolmitriptan This medication may also interact with the following: Certain medications for depression, anxiety, or other mental health conditions Propranolol This list may not describe all possible interactions. Give your health care provider a list of all the medicines, herbs, non-prescription drugs, or dietary supplements you use. Also tell them if you smoke, drink alcohol, or use illegal drugs. Some items may interact with your medicine. What should I watch for while using this medication? Visit your care team for regular checks on your progress. Tell your care team if your symptoms do not start to get better or if they get worse. This medication may affect your coordination, reaction time, or judgment.  Do not drive or operate machinery until you know how this medication affects you. Sit up or stand slowly to reduce the risk of dizzy or fainting spells. If you take migraine medications for 10 or more days a month, your migraines may get worse. Keep a diary of headache days and medication use. Contact your care team if your migraine attacks occur more frequently. What side effects may I notice from receiving this medication? Side effects that you should report to your care team as soon as possible: Allergic reactions--skin rash, itching, hives, swelling of the face, lips, tongue, or throat Burning, pain,  tingling, or color changes in the hands, arms, legs, or feet Heart attack--pain or tightness in the chest, shoulders, arms, or jaw, nausea, shortness of breath, cold or clammy skin, feeling faint or lightheaded Heart rhythm changes--fast or irregular heartbeat, dizziness, feeling faint or lightheaded, chest pain, trouble breathing Increase in blood pressure Irritability, confusion, fast or irregular heartbeat, muscle stiffness, twitching muscles, sweating, high fever, seizure, chills, vomiting, diarrhea, which may be signs of serotonin syndrome Raynaud syndrome--cool, numb, or painful fingers or toes that may change color from pale, to blue, to red Seizures Stroke--sudden numbness or weakness of the face, arm, or leg, trouble speaking, confusion, trouble walking, loss of balance or coordination, dizziness, severe headache, change in vision Sudden or severe stomach pain, bloody diarrhea, fever, nausea, vomiting Vision loss Side effects that usually do not require medical attention (report to your care team if they continue or are bothersome): Dizziness Unusual weakness or fatigue This list may not describe all possible side effects. Call your doctor for medical advice about side effects. You may report side effects to FDA at 1-800-FDA-1088. Where should I keep my medication? Keep out of the reach of children and pets. Store at room temperature between 15 and 30 degrees C (59 and 86 degrees F). Protect from light and moisture. Get rid of any unused medication after the expiration date. To get rid of medications that are no longer needed or have expired: Take the medication to a medication take-back program. Check with your pharmacy or law enforcement to find a location. If you cannot return the medication, check the label or package insert to see if the medication should be thrown out in the garbage or flushed down the toilet. If you are not sure, ask your care team. If it is safe to put it in the  trash, empty the medication out of the container. Mix the medication with cat litter, dirt, coffee grounds, or other unwanted substance. Seal the mixture in a bag or container. Put it in the trash. NOTE: This sheet is a summary. It may not cover all possible information. If you have questions about this medicine, talk to your doctor, pharmacist, or health care provider.  2023 Elsevier/Gold Standard (2021-09-08 00:00:00)   Ondansetron Dissolving Tablets What is this medication? ONDANSETRON (on DAN se tron) prevents nausea and vomiting from chemotherapy, radiation, or surgery. It works by blocking substances in the body that may cause nausea or vomiting. It belongs to a group of medications called antiemetics. This medicine may be used for other purposes; ask your health care provider or pharmacist if you have questions. COMMON BRAND NAME(S): Zofran ODT What should I tell my care team before I take this medication? They need to know if you have any of these conditions: Heart disease History of irregular heartbeat Liver disease Low levels of magnesium or potassium in the blood An unusual or allergic reaction  to ondansetron, granisetron, other medications, foods, dyes, or preservatives Pregnant or trying to get pregnant Breast-feeding How should I use this medication? These tablets are made to dissolve in the mouth. Do not try to push the tablet through the foil backing. With dry hands, peel away the foil backing and gently remove the tablet. Place the tablet in the mouth and allow it to dissolve, then swallow. While you may take these tablets with water, it is not necessary to do so. Talk to your care team regarding the use of this medication in children. Special care may be needed. Overdosage: If you think you have taken too much of this medicine contact a poison control center or emergency room at once. NOTE: This medicine is only for you. Do not share this medicine with others. What if I  miss a dose? If you miss a dose, take it as soon as you can. If it is almost time for your next dose, take only that dose. Do not take double or extra doses. What may interact with this medication? Do not take this medication with any of the following: Apomorphine Certain medications for fungal infections like fluconazole, itraconazole, ketoconazole, posaconazole, voriconazole Cisapride Dronedarone Pimozide Thioridazine This medication may also interact with the following: Carbamazepine Certain medications for depression, anxiety, or psychotic disturbances Fentanyl Linezolid MAOIs like Carbex, Eldepryl, Marplan, Nardil, and Parnate Methylene blue (injected into a vein) Other medications that prolong the QT interval (cause an abnormal heart rhythm) like dofetilide, ziprasidone Phenytoin Rifampicin Tramadol This list may not describe all possible interactions. Give your health care provider a list of all the medicines, herbs, non-prescription drugs, or dietary supplements you use. Also tell them if you smoke, drink alcohol, or use illegal drugs. Some items may interact with your medicine. What should I watch for while using this medication? Check with your care team as soon as you can if you have any sign of an allergic reaction. What side effects may I notice from receiving this medication? Side effects that you should report to your care team as soon as possible: Allergic reactions--skin rash, itching, hives, swelling of the face, lips, tongue, or throat Bowel blockage--stomach cramping, unable to have a bowel movement or pass gas, loss of appetite, vomiting Chest pain (angina)--pain, pressure, or tightness in the chest, neck, back, or arms Heart rhythm changes--fast or irregular heartbeat, dizziness, feeling faint or lightheaded, chest pain, trouble breathing Irritability, confusion, fast or irregular heartbeat, muscle stiffness, twitching muscles, sweating, high fever, seizure,  chills, vomiting, diarrhea, which may be signs of serotonin syndrome Side effects that usually do not require medical attention (report to your care team if they continue or are bothersome): Constipation Diarrhea General discomfort and fatigue Headache This list may not describe all possible side effects. Call your doctor for medical advice about side effects. You may report side effects to FDA at 1-800-FDA-1088. Where should I keep my medication? Keep out of the reach of children and pets. Store between 2 and 30 degrees C (36 and 86 degrees F). Throw away any unused medication after the expiration date. NOTE: This sheet is a summary. It may not cover all possible information. If you have questions about this medicine, talk to your doctor, pharmacist, or health care provider.  2023 Elsevier/Gold Standard (2004-05-27 00:00:00)

## 2022-01-10 ENCOUNTER — Encounter: Payer: Self-pay | Admitting: Neurology

## 2022-01-10 ENCOUNTER — Telehealth: Payer: Self-pay | Admitting: *Deleted

## 2022-01-10 DIAGNOSIS — F902 Attention-deficit hyperactivity disorder, combined type: Secondary | ICD-10-CM | POA: Diagnosis not present

## 2022-01-10 DIAGNOSIS — F411 Generalized anxiety disorder: Secondary | ICD-10-CM | POA: Diagnosis not present

## 2022-01-10 DIAGNOSIS — F331 Major depressive disorder, recurrent, moderate: Secondary | ICD-10-CM | POA: Diagnosis not present

## 2022-01-10 MED ORDER — RIZATRIPTAN BENZOATE 10 MG PO TBDP
10.0000 mg | ORAL_TABLET | ORAL | 11 refills | Status: DC | PRN
Start: 2022-01-10 — End: 2022-08-29

## 2022-01-10 NOTE — Telephone Encounter (Signed)
Approved today Effective from 01/10/2022 through 04/03/2022.

## 2022-01-10 NOTE — Telephone Encounter (Signed)
Nurtec PA done. Charted in its own phone note.

## 2022-01-10 NOTE — Telephone Encounter (Signed)
Completed Nurtec PA on Cover My Meds. Key: [id number]. Awaiting determination from [employer]. Requested #16 but [employer] typically only allows 8.

## 2022-01-10 NOTE — Addendum Note (Signed)
Addended by: Bertram Savin on: 01/10/2022 03:51 PM   Modules accepted: Orders

## 2022-01-28 DIAGNOSIS — J209 Acute bronchitis, unspecified: Secondary | ICD-10-CM | POA: Diagnosis not present

## 2022-01-28 DIAGNOSIS — J329 Chronic sinusitis, unspecified: Secondary | ICD-10-CM | POA: Diagnosis not present

## 2022-02-01 DIAGNOSIS — Z01419 Encounter for gynecological examination (general) (routine) without abnormal findings: Secondary | ICD-10-CM | POA: Diagnosis not present

## 2022-02-01 DIAGNOSIS — Z1151 Encounter for screening for human papillomavirus (HPV): Secondary | ICD-10-CM | POA: Diagnosis not present

## 2022-02-01 DIAGNOSIS — I10 Essential (primary) hypertension: Secondary | ICD-10-CM | POA: Insufficient documentation

## 2022-02-01 DIAGNOSIS — F419 Anxiety disorder, unspecified: Secondary | ICD-10-CM | POA: Insufficient documentation

## 2022-02-01 DIAGNOSIS — Z6827 Body mass index (BMI) 27.0-27.9, adult: Secondary | ICD-10-CM | POA: Diagnosis not present

## 2022-02-01 DIAGNOSIS — G43909 Migraine, unspecified, not intractable, without status migrainosus: Secondary | ICD-10-CM | POA: Insufficient documentation

## 2022-02-01 DIAGNOSIS — Z113 Encounter for screening for infections with a predominantly sexual mode of transmission: Secondary | ICD-10-CM | POA: Diagnosis not present

## 2022-02-02 ENCOUNTER — Telehealth: Payer: Self-pay | Admitting: Neurology

## 2022-02-02 NOTE — Telephone Encounter (Signed)
Pt has been told by pharmacy that a PA is needed on her Fremanezumab-vfrm (AJOVY) 225 MG/1.5ML SOAJ, pt states she is in need of this medication today.  Pt is asking for this to be processed as soon as possible.

## 2022-02-02 NOTE — Telephone Encounter (Signed)
PA done. Will update pt in mychart when we have a determination.

## 2022-02-02 NOTE — Telephone Encounter (Signed)
Completed renewal PA for Ajovy. Key: [id number]. Awaiting determination from [employer].

## 2022-02-06 NOTE — Telephone Encounter (Signed)
Approved on September 14 Effective from 02/02/2022 through 02/01/2023. 

## 2022-02-09 ENCOUNTER — Ambulatory Visit: Payer: BC Managed Care – PPO | Admitting: Neurology

## 2022-02-15 IMAGING — DX DG FINGER INDEX 2+V*L*
3 series · 3 of 3 positions shown · non-contrast
Comparison: None.

CLINICAL DATA: Recent dog bite injury, initial encounter

EXAM:
LEFT INDEX FINGER 2+V

[finger ap]
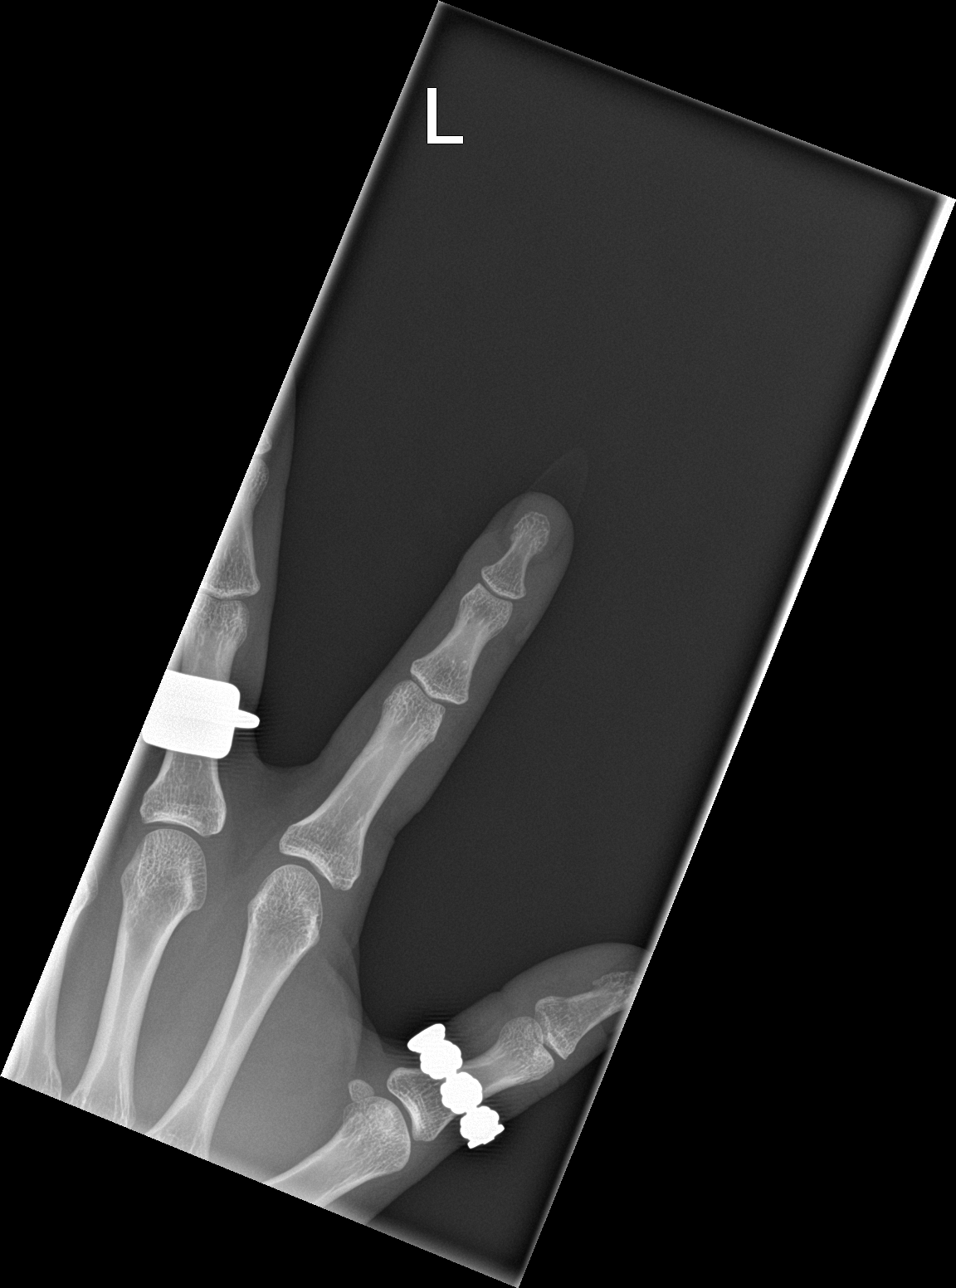

[finger obl]
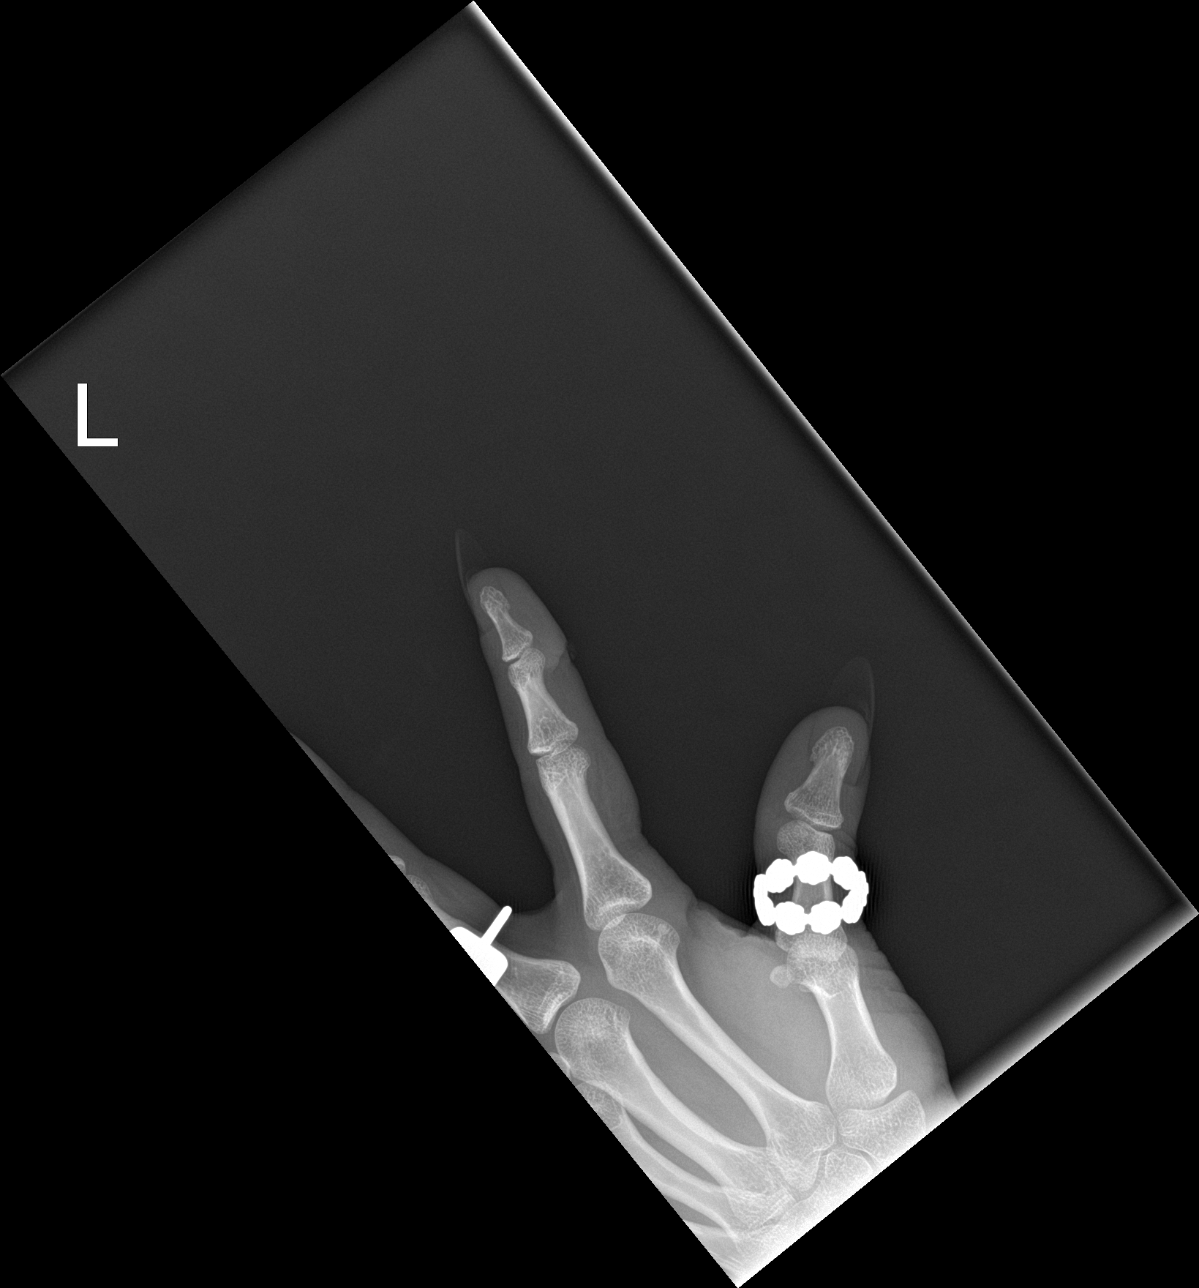

[finger lat]
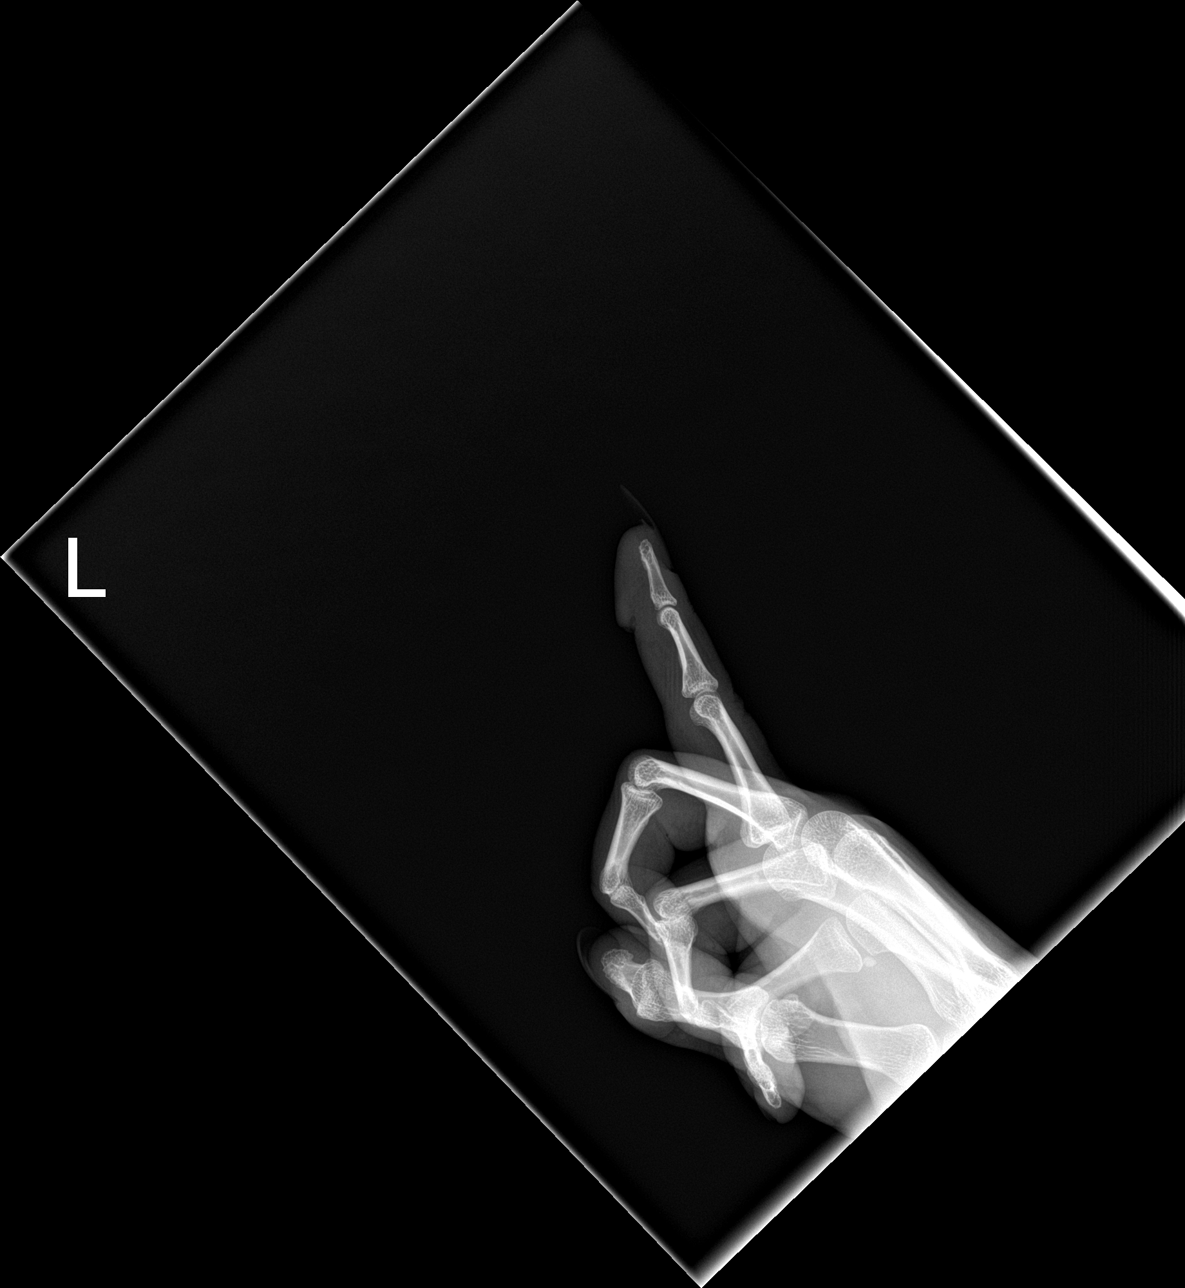

[3 of 3 positions shown; findings below may reference images not displayed]

FINDINGS: Soft tissue laceration is noted distally consistent with the given
clinical history. No acute fracture or dislocation is noted. No
radiopaque foreign body is noted.
IMPRESSION: Soft tissue injury without acute bony abnormality.

## 2022-02-28 ENCOUNTER — Encounter: Payer: Self-pay | Admitting: Neurology

## 2022-02-28 ENCOUNTER — Ambulatory Visit (INDEPENDENT_AMBULATORY_CARE_PROVIDER_SITE_OTHER): Payer: BC Managed Care – PPO | Admitting: Neurology

## 2022-02-28 VITALS — BP 138/84 | HR 59 | Ht <= 58 in | Wt 143.0 lb

## 2022-02-28 DIAGNOSIS — F39 Unspecified mood [affective] disorder: Secondary | ICD-10-CM | POA: Diagnosis not present

## 2022-02-28 DIAGNOSIS — G4719 Other hypersomnia: Secondary | ICD-10-CM

## 2022-02-28 DIAGNOSIS — F518 Other sleep disorders not due to a substance or known physiological condition: Secondary | ICD-10-CM

## 2022-02-28 DIAGNOSIS — G478 Other sleep disorders: Secondary | ICD-10-CM | POA: Diagnosis not present

## 2022-02-28 DIAGNOSIS — M2619 Other specified anomalies of jaw-cranial base relationship: Secondary | ICD-10-CM

## 2022-02-28 MED ORDER — GEMTESA 75 MG PO TABS: 75.0000 mg | ORAL_TABLET | Freq: Every day | ORAL | 0 refills | Status: DC

## 2022-02-28 NOTE — Patient Instructions (Addendum)
Narcolepsy Narcolepsy is a neurological disorder that causes people to fall asleep suddenly and without control (have sleep attacks) during the daytime. It is a lifelong disorder. Narcolepsy disrupts the sleep cycle at night, which then causes daytime sleepiness. What are the causes? The cause of narcolepsy is not fully understood, but it may be related to: Low levels of hypocretin, a chemical (neurotransmitter) in the brain that controls sleep and wake cycles. Hypocretin imbalance may be caused by: Abnormal genes that are passed from parent to child (inherited). An autoimmune disease in which the body's defense system (immune system) attacks the brain cells that make hypocretin. Infection, tumor, or injury in the area of the brain that controls sleep. Exposure to poisons (toxins), such as heavy metals, pesticides, and secondhand smoke. What are the signs or symptoms? Symptoms of this condition include: Excessive daytime sleepiness. This is the most common symptom and is usually the first symptom you will notice. This may affect your performance at work or school. Sleep attacks. You may fall asleep in the middle of an activity, especially low-energy activities like reading or watching TV. Feeling like you cannot think clearly and trouble focusing or remembering things. You may also feel depressed. Sudden muscle weakness (cataplexy). When this occurs, your speech may become slurred, or your knees may buckle. Cataplexy is usually triggered by surprise, anger, fear, or laughter. Losing the ability to speak or move (sleep paralysis). This may occur just as you start to fall asleep or wake up. You will be aware of the paralysis. It usually lasts for just a few seconds or minutes. Seeing, hearing, tasting, smelling, or feeling things that are not real (hallucinations). Hallucinations may occur with sleep paralysis. They can happen when you are falling asleep, waking up, or dozing. Trouble staying asleep  at night (insomnia) and restless sleep. How is this diagnosed? This condition may be diagnosed based on: A physical exam to rule out any other problems that may be causing your symptoms. You may be asked to write down your sleeping patterns for several weeks in a sleep diary. This will help your health care provider make a diagnosis. Sleep studies that measure how well your REM sleep is regulated. These tests also measure your heart rate, breathing, movement, and brain waves. These tests include: An overnight sleep study (polysomnogram). A daytime sleep study that is done while you take several naps during the day (multiple sleep latency test, MSLT). This test measures how quickly you fall asleep and how quickly you enter REM sleep. Removal of spinal fluid to measure hypocretin levels. How is this treated? There is no cure for this condition, but treatment can help relieve symptoms. Treatment may include: Lifestyle and sleeping strategies to help you cope with the condition, such as: Exercising regularly. Maintaining a regular sleep schedule. Avoiding caffeine and large meals before bed. Medicines. These may include: Medicines that help keep you awake and alert (stimulants) to fight daytime sleepiness. Medicines that treat depression (antidepressants). These may be used to treat cataplexy. Sodium oxybate. This is a strong medicine to help you relax (sedative) that you may take at night. It can help control daytime sleepiness and cataplexy. Other treatments may include mental health counseling or joining a support group. Follow these instructions at home: Sleeping habits  Get about 8 hours of sleep every night. Go to sleep and get up at about the same time every day. Keep your bedroom dark, quiet, and comfortable. When you feel very tired, take short naps. Schedule naps  so that you take them at about the same time every day. Before bedtime: Avoid bright lights and screens. Relax. Try  activities like reading or taking a warm bath. Activity Get at least 20 minutes of exercise every day. This will help you sleep better at night and reduce daytime sleepiness. Avoid exercising within 3 hours of bedtime. Do not drive or use heavy machinery if you are sleepy. If possible, take a nap before driving. Do not swim or go out on the water without a life jacket. Eating and drinking Do not drink alcohol or caffeinated beverages within 4-5 hours of bedtime. Do not eat a large meal before bedtime. Eat meals at about the same times every day. General instructions  Take over-the-counter and prescription medicines only as told by your health care provider. Keep a sleep diary as told by your health care provider. Tell your employer or teachers that you have narcolepsy. You may be able to adjust your schedule to include time for naps. Do not use any products that contain nicotine or tobacco, such as cigarettes, e-cigarettes, and chewing tobacco. If you need help quitting, ask your health care provider. Keep all follow-up visits as told by your health care provider. This is important. Where to find more information [employer] of Neurological Disorders: [employer]Box[employer] Contact a health care provider if: Your symptoms are not getting better. You have increasingly high blood pressure (hypertension). You have changes in your heart rhythm. You are having a hard time determining what is real and what is not (psychosis). Get help right away if you: Hurt yourself during a sleep attack or an attack of cataplexy. Have chest pain. Have trouble breathing. These symptoms may represent a serious problem that is an emergency. Do not wait to see if the symptoms will go away. Get medical help right away. Call your local emergency services (911 in the [place] Do not drive yourself to the hospital. Summary Narcolepsy is a neurological disorder that causes people to fall asleep suddenly, and without  control, during the daytime (sleep attacks). It is a lifelong disorder. There is no cure for this condition, but treatment can help relieve symptoms. Go to sleep and get up at about the same time every day. Follow instructions about sleep and activities as told by your health care provider. Take over-the-counter and prescription medicines only as told by your health care provider. This information is not intended to replace advice given to you by your health care provider. Make sure you discuss any questions you have with your health care provider. Document Revised: 06/13/2021 Document Reviewed: 12/18/2018  I have established the following assessments:  1) the patient has responded well to [place] in reduction of migraine  2) the patient has responded well to mood stabilizing medications, feels not depressed.  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 Sleep paralysis, and vivid dreams, dream interrupt her sleep, sleepiness by Epworth 17/ 24 and high fatigue score.    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  2) we need to obtain a PSG an then follow with an MSLT.  3) weaning off all med that are REM sleep suppressants-  Psychiatry R Elsie Stain, PA  at [facility] in [place]   I would like to thank [name], NP and Melvenia Beam, [address] [place],  [place] [address] for allowing me to meet with and to take care of this  pleasant patient.   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  She can stay on Lamictal up to one week before MSLT, but will need to avoid Lexapro for 14 days before MSLT .  Benzodiazepines should not be taken for 8 days prior.   I plan to follow up either personally or through our NP within 3 months.  [employer] Patient Education  [place].

## 2022-02-28 NOTE — Progress Notes (Signed)
SLEEP MEDICINE CLINIC    Provider:  Larey Seat, MD  Primary Care Physician:  [name], NP [place] [address]     Referring Provider: Melvenia Beam, [place],  [place] [address]          Chief Complaint according to patient   Patient presents with:     New Patient (Initial Visit)     Working as a Educational psychologist, some night up to midnight, vivid dreams, woken by those dreams and they feel realistic ,  30 months ago beginning with excessively daytime sleepy. In danger when driving, open window, loud radio, loads of caffeine. Has had severe migraines which dr Jaynee Eagles treated. Migraines since 2017-18. Vaping.       HISTORY OF PRESENT ILLNESS:  02-28-22:  Misty Ross is a 31 y.o.Caucasian female patient who was seen here upon referral by Dr Jaynee Eagles  on 02/28/2022 Chief concern according to patient :    Working fu ll time in evenings , she reports sleep paralysis. 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 In danger when driving, open window, loud radio, loads of caffeine. Has had severe migraines which Dr Jaynee Eagles treated with Arie Sabina. Migraines since 2017-18. Vaping.     I have the pleasure of seeing Misty Ross today, a right -handed Caucasian female with a possible sleep disorder.  She  has a past medical history of ADHD (attention deficit hyperactivity disorder), Allergy, Anxiety, Depression, Migraine. She is on an antidepressant and mood stabilizer.     Sleep relevant medical history: Nocturia- with overactive bladder- 1-2 times at night-pelvic PT completed.  Sleep paralysis, vivid dreams, waking from anxiety induced by dreams. Tonsillectomy at age 36,    Family medical /sleep history: no other family member on CPAP with OSA, insomnia, sleep walkers.    Social history:  Patient is working as a  Educational psychologist and lives in a household with parents. Starts to work after 4 Pm and up to midnight.  The patient currently works in shifts( Presenter, broadcasting,) Pets are present, 2 dogs. Tobacco use: yes .  ETOH use : rarely,  Caffeine intake in form of Coffee( 1 cup in AM) Soda( /) Tea ( /) or energy drinks. Regular exercise in form of walking.         Sleep habits are as follows: The patient's dinner time is between 8.30-9.30 PM.  The patient goes to bed at 12- 1 AM and continues to sleep for intervals of 2 hours, waking from/ by  dreams-  bathroom breaks, the first time at 2 AM, last one 6-8 AM.   The preferred sleep position is variable , with the support of 2 pillows. Dreams are reportedly frequent/vivid.  10  AM is the usual rise time. The patient wakes up spontaneously.  She reports not feeling refreshed or restored in AM, with symptoms such as dry mouth, morning headaches, and residual fatigue.  Naps are taken infrequently, lasting from 15 to 30 minutes and are refreshing.    Review of Systems: Out of a complete 14 system review, the patient complains of only the following symptoms, and all other reviewed systems are negative.:   Working fu ll time in evenings , she reports sleep paralysis. 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 In danger when driving, open window, loud radio, loads of caffeine. Has had severe migraines which Dr Jaynee Eagles treated with Arie Sabina. Migraines since 2017-18. Vaping.  Fatigue, sleepiness , snoring, fragmented sleep, Insomnia, not for initiation but  for sleep to be sustained-    How likely are you to doze in the following situations: 0 = not likely, 1 = slight chance, 2 = moderate chance, 3 = high chance   Sitting and Reading? Watching Television? Sitting inactive in a public place (theater or meeting)? As a passenger in a car  for an hour without a break? Lying down in the afternoon when circumstances permit? Sitting and talking to someone? Sitting quietly after lunch without alcohol? In a car, while stopped for a few minutes in traffic?   Total = 19/ 24 points   FSS endorsed at 44/ 63 points.     Social History   Socioeconomic History   Marital status: Single    Spouse name: Not on file   Number of children: Not on file   Years of education: Not on file   Highest education level: Not on file  Occupational History   Not on file  Tobacco Use   Smoking status: Every Day    Packs/day: 0.50    Types: Cigarettes   Smokeless tobacco: Not on file  Substance and Sexual Activity   Alcohol use: Yes    Comment: one beer per day   Drug use: Yes    Comment: heroine   Sexual activity: Not on file  Other Topics Concern   Not on file  Social History Narrative   Not on file   Social Determinants of Health   Financial Resource Strain: Not on file  Food Insecurity: Not on file  Transportation Needs: Not on file  Physical Activity: Not on file  Stress: Not on file  Social Connections: Not on file    Family History  Problem Relation Age of Onset   Hyperlipidemia Father    Migraines Father    Multiple sclerosis Brother    Cancer Maternal Grandmother    Diabetes Maternal Grandmother    Cancer Maternal Grandfather    Heart disease Paternal Grandmother    Cancer Paternal Grandfather     Past Medical History:  Diagnosis Date   ADHD (attention deficit hyperactivity disorder)    Allergy    Anxiety    Depression    Migraine    Seizures ([place])     Past Surgical History:  Procedure Laterality Date   TONSILLECTOMY AND ADENOIDECTOMY       Current Outpatient Medications on File Prior to Visit  Medication Sig Dispense Refill   clonazePAM (KLONOPIN) 0.5 MG tablet Take 0.5 mg by mouth as needed.     escitalopram (LEXAPRO) 20 MG tablet Take 20 mg by mouth daily.     Fremanezumab-vfrm (AJOVY) 225  MG/1.5ML SOAJ Inject 225 mg into the skin every 30 (thirty) days. 1 mL 11   lamoTRIgine (LAMICTAL) 25 MG tablet 1 tablet     metoprolol tartrate (LOPRESSOR) 50 MG tablet Take 75 mg by mouth daily.     norethindrone (MICRONOR) 0.35 MG tablet Take 1 tablet by mouth daily.     ondansetron (ZOFRAN-ODT) 4 MG disintegrating tablet Take 1-2 tablets (4-8 mg total) by mouth every 8 (eight) hours as needed. Great for nausea or motion sickness. Can also take  it with Rizatriptan or nurtec for migraines 30 tablet 3   Rimegepant Sulfate (NURTEC) 75 MG TBDP Take 75 mg by mouth daily as needed. For migraines. Take as close to onset of migraine as possible. One daily maximum. 16 tablet 11   rizatriptan (MAXALT-MLT) 10 MG disintegrating tablet Take 1 tablet (10 mg total) by mouth as needed for migraine. May repeat in 2 hours if needed. Take right at onset of migraine 9 tablet 11   Vitamin D, Ergocalciferol, (DRISDOL) 1.25 MG (50000 UNIT) CAPS capsule Take 50,000 Units by mouth once a week.     No current facility-administered medications on file prior to visit.    Allergies  Allergen Reactions   Benadryl [Diphenhydramine]     Feels like skin is crawling    Physical exam:  Today's Vitals   02/28/22 1252  BP: 138/84  Pulse: (!) 59  Weight: 143 lb (64.9 kg)  Height: 4' 10"  (1.473 m)   Body mass index is 29.89 kg/m.   Wt Readings from Last 3 Encounters:  02/28/22 143 lb (64.9 kg)  10/04/21 138 lb 6.4 oz (62.8 kg)  04/23/21 135 lb (61.2 kg)     Ht Readings from Last 3 Encounters:  02/28/22 4' 10"  (1.473 m)  10/04/21 4' 10"  (1.473 m)  04/23/21 5' 3"  (1.6 m)      General: The patient is awake, alert and appears not in acute distress. The patient is well groomed. Head: Normocephalic, atraumatic. Neck is supple.  Mallampati : 2,  neck circumference:13.25  inches . Nasal airflow is restricted but  patent.   Retrognathia is seen.  Dental status: braces ,  Cardiovascular:  Regular rate and cardiac  rhythm by pulse,  without distended neck veins. Respiratory: Lungs are clear to auscultation.  Skin:  Without evidence of ankle edema, or rash. Trunk: The patient's posture is erect.   Neurologic exam : The patient is awake and alert, oriented to place and time.   Memory subjective described as intact.  Attention span & concentration ability appears normal.  Speech is fluent,  without  dysarthria, dysphonia or aphasia.  Mood and affect are appropriate.   Cranial nerves: no loss of smell or taste reported  Pupils are equal and briskly reactive to light. Funduscopic exam deferred.  Extraocular movements in vertical and horizontal planes were intact and without nystagmus. Has had Diplopia with migraine. . Visual fields by finger perimetry are intact. Hearing was intact to soft voice and finger rubbing.  Rinne weber lateralized bone conduction to the right side.   Facial sensation intact to fine touch.  Facial motor strength is symmetric and tongue and uvula move midline.  Neck ROM : rotation, tilt and flexion extension were normal for age and shoulder shrug was symmetrical.    Motor exam:  Symmetric bulk, tone and ROM.   Normal tone without cog wheeling, symmetric grip strength .   Sensory:  Fine touch and vibration were normal.  Proprioception tested in the upper extremities was normal.   Coordination: Rapid alternating movements in the fingers/hands were of normal speed.  The Finger-to-nose maneuver was intact without evidence of ataxia, dysmetria . There has bee a low amplitude r tremor in both hands all her life.    Gait and station: Patient could rise unassisted from a seated position, walked without assistive device.  Stance is of normal width/ base and the patient turned with 3 steps.  Toe and heel walk were deferred.  Deep tendon reflexes: in the  upper and lower extremities are symmetric and intact.  Babinski response was deferred .       After spending a total time of 40  minutes face to face and additional time for physical and neurologic examination, review of laboratory studies,  personal review of imaging studies, reports and results of other testing and review of referral information / records as far as provided in visit, I have established the following assessments:  1) the patient has responded well to [place] in reduction of migraine  2) the patient has responded well to mood stabilizing medications, feels not depressed.  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 Sleep paralysis, and vivid dreams, dream interrupt her sleep, sleepiness by Epworth 17/ 24 and high fatigue score.    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  2) we need to obtain a PSG an then follow with an MSLT.  3) weaning off all med that are REM sleep suppressants-  Psychiatry R Elsie Stain, PA  at [facility] in [place]   I would like to thank [name], NP and Melvenia Beam, [place] [address] [place],  [place] [address] for allowing me to meet with and to take care of this pleasant patient.   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  She can stay on Lamictal up to one week before MSLT, but will need to avoid Lexapro for 14 days before MSLT .  Benzodiazepines should not be taken for 8 days prior.   I plan to follow up either personally or through our NP within 3 months.   CC: I will share my notes with PCP, Dr Jaynee Eagles.  Electronically signed by: Larey Seat, MD 02/28/2022 1:09 PM  [facility] Board certified by The [employer] of Sleep Medicine and Diplomate of the [employer] of Sleep Medicine. Board certified In Neurology through the [place], Fellow of the [employer] of Neurology. Medical Director of  [facility].

## 2022-03-14 ENCOUNTER — Telehealth: Payer: Self-pay | Admitting: Neurology

## 2022-03-14 NOTE — Telephone Encounter (Signed)
LVM for pt to call back to schedule   BCBS auth: [id number] (exp. 03/13/22 to 05/11/22)

## 2022-03-21 NOTE — Telephone Encounter (Signed)
LVM for pt to call back to schedule.

## 2022-03-23 NOTE — Telephone Encounter (Signed)
LVM for pt to call back to schedule.

## 2022-03-27 NOTE — Telephone Encounter (Signed)
LVM for pt to call back to schedule sleep study.  

## 2022-03-30 ENCOUNTER — Telehealth: Payer: Self-pay | Admitting: *Deleted

## 2022-03-30 NOTE — Telephone Encounter (Signed)
Nurtec PA, Key: [id number], faxed office  notes to be attached.

## 2022-03-30 NOTE — Telephone Encounter (Signed)
our information has been submitted to [facility]. [employer] [place] will review the request and notify you of the determination decision directly, typically within 72 hours of receiving all information.  If [employer] has not responded within the specified timeframe or if you have any questions about your PA submission, contact [employer]  directly at [phone].

## 2022-03-30 NOTE — Telephone Encounter (Signed)
Patient returned our call.  She is scheduled at [facility] for 05/17/22 at 9 pm & 05/18/22.  Mailed packet to the patient.

## 2022-03-31 DIAGNOSIS — Z113 Encounter for screening for infections with a predominantly sexual mode of transmission: Secondary | ICD-10-CM | POA: Diagnosis not present

## 2022-04-11 DIAGNOSIS — F411 Generalized anxiety disorder: Secondary | ICD-10-CM | POA: Diagnosis not present

## 2022-04-11 DIAGNOSIS — F330 Major depressive disorder, recurrent, mild: Secondary | ICD-10-CM | POA: Diagnosis not present

## 2022-04-11 DIAGNOSIS — F519 Sleep disorder not due to a substance or known physiological condition, unspecified: Secondary | ICD-10-CM | POA: Diagnosis not present

## 2022-04-17 NOTE — Telephone Encounter (Signed)
Updated auth.  NPSG/MSLT [employer] [place]: [id number] (exp. 04/17/22 to 06/15/22) EE

## 2022-04-27 ENCOUNTER — Telehealth: Payer: Self-pay | Admitting: Neurology

## 2022-04-27 NOTE — Telephone Encounter (Signed)
Called pt. Advised I spoke with Meagan A in sleep lab. Nicotine patch should not be an issue. She should just let tech know when she gets there that she has it on and where it is located so they can be mindful of it.   She should also stop clonazepam 05/03/22 since study scheduled for 05/17/22. Has to be off of this at least 14 days. She verbalized understanding.

## 2022-04-27 NOTE — Telephone Encounter (Signed)
Pt ask , if allowed to wear nicotine patch day of sleep lab appt? When can stop taking clonazePAM (KLONOPIN) 0.5 MG tablet before sleep lab appt on 05/17/22. Would like a call from the nurse.

## 2022-05-04 NOTE — Patient Instructions (Incomplete)

## 2022-05-04 NOTE — Progress Notes (Deleted)
PATIENT: Misty Ross DOB: 1991/04/15  REASON FOR VISIT: follow up HISTORY FROM: patient  Virtual Visit via Telephone Note  I connected with Georgiann Hahn on 05/04/22 at  2:00 PM EST by telephone and verified that I am speaking with the correct person using two identifiers.   I discussed the limitations, risks, security and privacy concerns of performing an evaluation and management service by telephone and the availability of in person appointments. I also discussed with the patient that there may be a patient responsible charge related to this service. The patient expressed understanding and agreed to proceed.   History of Present Illness:  05/04/22 ALL: Akshita Italiano is a 31 y.o. 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 She continues Ajovy and Nurtec.   PSG and MSLT ordered at consult with Dr Vickey Huger 02/2022 but not performed yet due to    History (copied from Dr Dohmeier's previous note)  Martena B. Frame is a 31 y.o.Caucasian female patient who was seen here upon referral by Dr Lucia Gaskins  on 02/28/2022 Chief concern according to patient :     Working fu ll time in evenings , she reports sleep paralysis. 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 In danger when driving, open window, loud radio, loads of caffeine. Has had severe migraines which Dr Lucia Gaskins treated with Arnetha Massy. Migraines since 2017-18. Vaping.    I have the pleasure of seeing Ciara B. Steel today, a right -handed Caucasian female with a possible sleep disorder.  She  has a past medical history of ADHD (attention deficit hyperactivity disorder), Allergy, Anxiety, Depression, Migraine. She is on an antidepressant and mood stabilizer.     Sleep relevant medical history: Nocturia- with overactive bladder- 1-2 times at night-pelvic PT completed.   Sleep paralysis, vivid dreams, waking from anxiety induced by dreams. Tonsillectomy at age 56,    Family medical /sleep history: no other family member on CPAP with OSA, insomnia, sleep walkers.    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 Starts to work after 4 Pm and up to midnight.  The patient currently works in shifts( Chief Technology Officer,) Pets are present, 2 dogs. Tobacco use: yes .  ETOH use : rarely,  Caffeine intake in form of Coffee( 1 cup in AM) Soda( /) Tea ( /) or energy drinks. Regular exercise in form of walking.      Sleep habits are as follows: The patient's dinner time is between 8.30-9.30 PM.  The patient goes to bed at 12- 1 AM and continues to sleep for intervals of 2 hours, waking from/ by  dreams-  bathroom breaks, the first time at 2 AM, last one 6-8 AM.   The preferred sleep position is variable , with the support of 2 pillows. Dreams are reportedly frequent/vivid.  10  AM is the usual rise time. The patient wakes up spontaneously.  She reports not feeling refreshed or restored in AM, with symptoms such as dry mouth, morning headaches, and residual fatigue.  Naps are taken infrequently, lasting from 15 to 30 minutes and are refreshing.   Observations/Objective:  Generalized: Well developed, in no acute distress  Mentation: Alert oriented to time, place, history taking. Follows all commands speech and language fluent   Assessment and Plan:  31  y.o. year old female  has a past medical history of ADHD (attention deficit hyperactivity disorder), Allergy, Anxiety, Depression, Migraine, and Seizures ([facility]). here with  No diagnosis found.  No orders of the defined types were placed in this encounter.   No orders of the defined types were placed in this encounter.    Follow Up Instructions:  I discussed the assessment and treatment plan with the patient. The patient was provided an opportunity to ask questions and  all were answered. The patient agreed with the plan and demonstrated an understanding of the instructions.   The patient was advised to call back or seek an in-person evaluation if the symptoms worsen or if the condition fails to improve as anticipated.  I provided *** minutes of non-face-to-face time during this encounter. Patient located at their place of residence during Mychart visit. Provider is in the office.    Shawnie Dapper, NP

## 2022-05-09 ENCOUNTER — Telehealth: Payer: BC Managed Care – PPO | Admitting: Family Medicine

## 2022-05-09 DIAGNOSIS — F518 Other sleep disorders not due to a substance or known physiological condition: Secondary | ICD-10-CM

## 2022-05-09 DIAGNOSIS — G43009 Migraine without aura, not intractable, without status migrainosus: Secondary | ICD-10-CM

## 2022-05-09 DIAGNOSIS — G478 Other sleep disorders: Secondary | ICD-10-CM

## 2022-05-17 ENCOUNTER — Ambulatory Visit (INDEPENDENT_AMBULATORY_CARE_PROVIDER_SITE_OTHER): Payer: BC Managed Care – PPO | Admitting: Neurology

## 2022-05-17 DIAGNOSIS — G478 Other sleep disorders: Secondary | ICD-10-CM

## 2022-05-17 DIAGNOSIS — F518 Other sleep disorders not due to a substance or known physiological condition: Secondary | ICD-10-CM

## 2022-05-17 DIAGNOSIS — G4710 Hypersomnia, unspecified: Secondary | ICD-10-CM

## 2022-05-17 DIAGNOSIS — G4720 Circadian rhythm sleep disorder, unspecified type: Secondary | ICD-10-CM

## 2022-05-17 DIAGNOSIS — F39 Unspecified mood [affective] disorder: Secondary | ICD-10-CM

## 2022-05-17 DIAGNOSIS — M2619 Other specified anomalies of jaw-cranial base relationship: Secondary | ICD-10-CM

## 2022-05-17 DIAGNOSIS — G4719 Other hypersomnia: Secondary | ICD-10-CM

## 2022-05-25 NOTE — Procedures (Signed)
[facility] POLYSOMNOGRAPHY  INTERPRETATION REPORT   STUDY DATE:  05/17/2022     PATIENT NAME:  Misty Ross         DATE OF BIRTH:  [birth year]  PATIENT ID:  [id number]    TYPE OF STUDY:  PSG  READING PHYSICIAN: Star Age, MD, PhD   SCORING TECHNICIAN: Gaylyn Cheers, RPSGT   Referring provider: Dr. Jaynee Eagles  Physician Interpretation (study interpreted on behalf of Dr. Brett Fairy):   History: 32 year old female with an underlying medical history of ADHD (attention deficit hyperactivity disorder), Allergy, Anxiety, Depression, Migraine and overweight state, who reports daytime somnolence, vivid dreams, and sleep paralysis. The patient was scheduled for a PSG with next day MSLT. The patient tapered off her psychotropic medications in preparation for her study. MSLT next day was canceled due to insufficient sleep time at night, achieving less than 6 hours of total sleep time prior to MSLT.  Height: 58 in Weight: 143 lb (BMI 29) Neck Size: 13 in   MEDICATIONS: Klonopin, Lexapro, Ajovy, Lamictal, Lopressor, Micronor,Zofran-ODT, Nurtec, Maxalt-MLT, Drisdol  TECHNICAL DESCRIPTION: A registered sleep technologist was in attendance for the duration of the recording.  Data collection, scoring, video monitoring, and reporting were performed in compliance with the AASM Manual for the Scoring of Sleep and Associated Events; (Hypopnea is scored based on the criteria listed in Section VIII D. 1b in the AASM Manual V2.6 using a 4% oxygen desaturation rule or Hypopnea is scored based on the criteria listed in Section VIII D. 1a in the AASM Manual V2.6 using 3% oxygen desaturation and /or arousal rule).   SLEEP CONTINUITY AND SLEEP ARCHITECTURE:  Lights-out was at 22:01: and lights-on at  07:52:, with a total recording time of 9 hours, 50.5 min. Total sleep time ( TST) was 332.0 minutes with a decreased sleep efficiency at 56.2%.    BODY POSITION:  TST was divided  between  the following sleep positions: 28.5% supine;  71.5% lateral;  0% prone. Duration of total sleep and percent of total sleep in their respective position is as follows: supine 94 minutes (28%), non-supine 238 minutes (72%); right 107 minutes (32%), left 130 minutes (39%), and prone 00 minutes (0%).  Total supine REM sleep time was 13 minutes (23% of total REM sleep). Sleep latency was markedly increased, at 153.0 minutes.  REM sleep latency was normal at 101.0 minutes. Of the total sleep time, the percentage of stage N1 sleep was 7.2%, stage N2 sleep was 69%, which is increased, stage N3 sleep was 7.1%, which is reduced, and REM sleep was 17.2%, which is mildly reduced. Wake after sleep onset (WASO) time accounted for 104 minutes with moderate sleep fragmentation noted.   RESPIRATORY MONITORING:  Based on CMS criteria (using a 4% oxygen desaturation rule for scoring hypopneas), there were 6 apneas (4 obstructive; 0 central; 2 mixed), and 8 hypopneas.  Apnea index was 1.1. Hypopnea index was 1.4. The apnea-hypopnea index was 2.5 overall (7.6 supine, 3 non-supine; 7.4 REM, 23.1 supine REM). There were 0 respiratory effort-related arousals (RERAs).  The RERA index was 0 events/h. Total respiratory disturbance index (RDI) was 2.5 events/h. RDI results showed: supine RDI  7.6 /h; non-supine RDI 0.5 /h; REM RDI 7.4 /h, supine REM RDI 23.1 /h.   Based on AASM criteria (using a 3% oxygen desaturation and /or arousal rule for scoring hypopneas), there were 6 apneas (4 obstructive; 0 central; 2 mixed), and 17 hypopneas. Apnea index was 1.1. Hypopnea index was 3.1.  The apnea-hypopnea index was 4.14/hour overall (13.3 supine, 3 non-supine; 8.4 REM, 27.7 supine REM). There were 0 respiratory effort-related arousals (RERAs).  The RERA index was 0 events/h. Total respiratory disturbance index (RDI) was 4.2 events/h. RDI results showed: supine RDI  13.3 /h; non-supine RDI 0.5 /h; REM RDI 8.4 /h, supine REM RDI 27.7 /h.    OXIMETRY: Oxyhemoglobin Saturation Nadir during sleep was at  91%) from a mean of 97%.  Of the Total sleep time (TST)   hypoxemia (=<88%) was present for  0.0 minutes, or 0.0% of total sleep time.   LIMB MOVEMENTS: There were 11 periodic limb movements of sleep (2.0/hr), of which 2 (0.4/hr) were associated with an arousal.  AROUSAL: There were 47 arousals in total, for an arousal index of 8 arousals/hour.  Of these, 8 were identified as respiratory-related arousals (1 /h), 2 were PLM-related arousals (0 /h), and 44 were non-specific arousals (8 /h).  EEG:  Review of the EEG showed no abnormal electrical discharges and symmetrical bihemispheric findings.    EKG: The EKG revealed normal sinus rhythm (NSR). The average heart rate during sleep was 66 bpm.  AUDIO/VIDEO REVIEW: The audio and video review did not show any abnormal or unusual behaviors, movements, phonations or vocalizations. The patient took 1 restroom break.  No significant or audible snoring was detected.  POST-STUDY QUESTIONNAIRE: Post study, the patient indicated, that sleep was worse than usual.   IMPRESSION:  1. Dysfunctions associated with sleep stages or arousal from sleep  RECOMMENDATIONS:  1. This study does not demonstrate any significant obstructive or central sleep disordered breathing with an AHI of less than 5/hour - her total AHI was 4.1/h, O2 nadir 91%.  No significant snoring was detected, no significant PLMs were noted.  Next day MSLT was canceled because of insufficient total sleep time for the nighttime sleep study, total sleep time was 5 hours and 33 minutes. 2. This study shows a reduced sleep efficiency, sleep fragmentation and abnormal sleep stage percentages; these are nonspecific findings and per se do not signify an intrinsic sleep disorder or a cause for the patient's sleep-related symptoms. Causes include (but are not limited to) the first night effect of the sleep study, circadian rhythm disturbances,  medication effect or an underlying mood disorder or medical problem.  3. The patient should be cautioned not to drive, work at heights, or operate dangerous or heavy equipment when tired or sleepy. Review and reiteration of good sleep hygiene measures should be pursued with any patient. 4. The patient will be advised of her test results and offered a follow-up appointment in sleep clinic.   I certify that I have reviewed the entire raw data recording prior to the issuance of this report in accordance with the Standards of Accreditation of the American Academy of Sleep Medicine (AASM).  Huston Foley, MD, PhD Medical Director, [facility]) Diplomat, ABPN (Neurology and Sleep)            Technical Report:   General Information  Name: Misty Ross, Misty Ross BMI: 29.89 Physician: Huston Foley, MD  ID: [id number] Height: 58.0 in Technician: Domingo Cocking, RPSGT  Sex: Female Weight: 143.0 lb Record: [id number]  Age: 70 [07/29/1990] Date: 05/17/2022    Medical & Medication History    Misty Ross is a 32 y.o. right -handed Caucasian female with a possible sleep disorder. She has a past medical history of ADHD (attention deficit hyperactivity disorder), Allergy, Anxiety, Depression, Migraine. She is on an  antidepressant and mood stabilizer. Chief concern according to patient : Working fu ll time in evenings, she reports sleep paralysis. 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 In danger when driving, open window, loud radio, loads of caffeine. Has had severe migraines which Dr Jaynee Eagles treated with Arie Sabina. Migraines since 2017-18. Vaping.  Klonopin, Lexapro, Ajovy, Lamictal, Lopressor, Micronor,Zofran-ODT, Nurtec, Maxalt-MLT, Drisdol   Sleep Disorder      Comments   Patient arrived for a diagnostic polysomnogram to be followed  by an MSLT. Procedure explained and all questions answered. Standard paste setup without complications. Significant delay to persistent sleep observed. Patient slept supine, left, and right. No audible snoring was heard. No significant respiratory events observed. No obvious cardiac arrhythmias noted. No significant PLMS observed. Patient had one restroom visit.    Lights out: 10:01:48 PM Lights on: 07:52:17 AM   Time Total Supine Side Prone Upright  Recording (TRT) 9h 50.40m 4h 38.33m 5h 12.48m 0h 0.56m 0h 0.93m  Sleep (TST) 5h 33.29m 1h 36.28m 3h 57.59m 0h 0.2m 0h 0.28m   Latency N1 N2 N3 REM Onset Per. Slp. Eff.  Actual 0h 0.38m 0h 15.93m 0h 57.87m 1h 41.36m 2h 33.32m 2h 59.7m 56.48%   Stg Dur Wake N1 N2 N3 REM  Total 257.0 24.0 227.5 23.5 57.0  Supine 182.5 4.5 75.5 1.5 13.0  Side 74.5 19.5 152.0 22.0 44.0  Prone 0.0 0.0 0.0 0.0 0.0  Upright 0.0 0.0 0.0 0.0 0.0   Stg % Wake N1 N2 N3 REM  Total 43.5 7.2 68.2 7.0 17.1  Supine 30.9 1.3 22.6 0.4 3.9  Side 12.6 5.8 45.6 6.6 13.2  Prone 0.0 0.0 0.0 0.0 0.0  Upright 0.0 0.0 0.0 0.0 0.0     Apnea Summary Sub Supine Side Prone Upright  Total 6 Total 6 4 2  0 0    REM 2 0 2 0 0    NREM 4 4 0 0 0  Obs 4 REM 0 0 0 0 0    NREM 4 4 0 0 0  Mix 2 REM 2 0 2 0 0    NREM 0 0 0 0 0  Cen 0 REM 0 0 0 0 0    NREM 0 0 0 0 0   Rera Summary Sub Supine Side Prone Upright  Total 0 Total 0 0 0 0 0    REM 0 0 0 0 0    NREM 0 0 0 0 0   Hypopnea Summary Sub Supine Side Prone Upright  Total 17 Total 17 17 0 0 0    REM 6 6 0 0 0    NREM 11 11 0 0 0   4% Hypopnea Summary Sub Supine Side Prone Upright  Total (4%) 8 Total 8 8 0 0 0    REM 5 5 0 0 0    NREM 3 3 0 0 0     AHI Total Obs Mix Cen  4.14 Apnea 1.08 0.72 0.36 0.00   Hypopnea 3.06 -- -- --  2.52 Hypopnea (4%) 1.44 -- -- --    Total Supine Side Prone Upright  Position AHI 4.14 13.13 0.51 0.00 0.00  REM AHI 8.42   NREM AHI 3.27   Position RDI 4.14 13.13 0.51 0.00 0.00  REM RDI 8.42   NREM RDI  3.27    4% Hypopnea Total Supine  Side Prone Upright  Position AHI (4%) 2.52 7.50 0.51 0.00 0.00  REM AHI (4%) 7.37   NREM AHI (4%) 1.53   Position RDI (4%) 2.52 7.50 0.51 0.00 0.00  REM RDI (4%) 7.37   NREM RDI (4%) 1.53    Desaturation Information Threshold: 2% <100% <90% <80% <70% <60% <50% <40%  Supine 92.0 1.0 0.0 0.0 0.0 0.0 0.0  Side 85.0 0.0 0.0 0.0 0.0 0.0 0.0  Prone 0.0 0.0 0.0 0.0 0.0 0.0 0.0  Upright 0.0 0.0 0.0 0.0 0.0 0.0 0.0  Total 177.0 1.0 0.0 0.0 0.0 0.0 0.0  Index 28.2 0.2 0.0 0.0 0.0 0.0 0.0   Threshold: 3% <100% <90% <80% <70% <60% <50% <40%  Supine 35.0 1.0 0.0 0.0 0.0 0.0 0.0  Side 16.0 0.0 0.0 0.0 0.0 0.0 0.0  Prone 0.0 0.0 0.0 0.0 0.0 0.0 0.0  Upright 0.0 0.0 0.0 0.0 0.0 0.0 0.0  Total 51.0 1.0 0.0 0.0 0.0 0.0 0.0  Index 8.1 0.2 0.0 0.0 0.0 0.0 0.0   Threshold: 4% <100% <90% <80% <70% <60% <50% <40%  Supine 12.0 1.0 0.0 0.0 0.0 0.0 0.0  Side 9.0 0.0 0.0 0.0 0.0 0.0 0.0  Prone 0.0 0.0 0.0 0.0 0.0 0.0 0.0  Upright 0.0 0.0 0.0 0.0 0.0 0.0 0.0  Total 21.0 1.0 0.0 0.0 0.0 0.0 0.0  Index 3.4 0.2 0.0 0.0 0.0 0.0 0.0   Threshold: 3% <100% <90% <80% <70% <60% <50% <40%  Supine 35 1 0 0 0 0 0  Side 16 0 0 0 0 0 0  Prone 0 0 0 0 0 0 0  Upright 0 0 0 0 0 0 0  Total 51 1 0 0 0 0 0   Awakening/Arousal Information # of Awakenings 27  Wake after sleep onset 104.31m  Wake after persistent sleep 91.27m   Arousal Assoc. Arousals Index  Apneas 2 0.4  Hypopneas 6 1.1  Leg Movements 12 2.2  Snore 0 0.0  PTT Arousals 0 0.0  Spontaneous 45 8.1  Total 65 11.7  Leg Movement Information PLMS LMs Index  Total LMs during PLMS 11 2.0  LMs w/ Microarousals 2 0.4   LM LMs Index  w/ Microarousal 9 1.6  w/ Awakening 3 0.5  w/ Resp Event 0 0.0  Spontaneous 8 1.4  Total 17 3.1     Desaturation threshold setting: 3% Minimum desaturation setting: 10 seconds SaO2 nadir: 89% The longest event was a 40 sec obstructive Hypopnea with a minimum SaO2 of 94%. The lowest  SaO2 was 89% associated with a 24 sec obstructive Hypopnea. EKG Rates EKG Avg Max Min  Awake 70 101 52  Asleep 66 91 53  EKG Events: Tachycardia

## 2022-05-26 ENCOUNTER — Telehealth: Payer: Self-pay | Admitting: Neurology

## 2022-05-26 NOTE — Telephone Encounter (Signed)
This patient saw Dr. Brett Fairy for sleep evaluation on 02/28/22.  I read the PSG from 05/17/2022 on Dr. Edwena Felty behalf:  Please advise patient that her sleep study did not show any specific abnormalities, in particular, no evidence of significant sleep apnea, no significant snoring, no significant leg twitching.  She had mildly reduced dream sleep and she did have trouble falling asleep and staying asleep.  We had to cancel the next day nap study because she did not achieve enough sleep at night.  If she would like to discuss further evaluation and management and details of her study with Dr. Brett Fairy, please assist with a follow-up appointment for next available with Dr. Brett Fairy.

## 2022-06-06 DIAGNOSIS — F902 Attention-deficit hyperactivity disorder, combined type: Secondary | ICD-10-CM | POA: Diagnosis not present

## 2022-06-06 DIAGNOSIS — F519 Sleep disorder not due to a substance or known physiological condition, unspecified: Secondary | ICD-10-CM | POA: Diagnosis not present

## 2022-06-06 DIAGNOSIS — F330 Major depressive disorder, recurrent, mild: Secondary | ICD-10-CM | POA: Diagnosis not present

## 2022-06-06 DIAGNOSIS — F411 Generalized anxiety disorder: Secondary | ICD-10-CM | POA: Diagnosis not present

## 2022-06-15 ENCOUNTER — Encounter: Payer: Self-pay | Admitting: Neurology

## 2022-06-20 DIAGNOSIS — R899 Unspecified abnormal finding in specimens from other organs, systems and tissues: Secondary | ICD-10-CM | POA: Diagnosis not present

## 2022-06-30 ENCOUNTER — Other Ambulatory Visit: Payer: Self-pay | Admitting: Neurology

## 2022-07-03 NOTE — Telephone Encounter (Signed)
LMVM for pt to return call (still taking? Nurtec).  If so need to do PA, need new insurance if has.

## 2022-07-04 ENCOUNTER — Encounter: Payer: Self-pay | Admitting: Neurology

## 2022-07-04 ENCOUNTER — Encounter: Payer: Self-pay | Admitting: *Deleted

## 2022-07-04 NOTE — Telephone Encounter (Signed)
PA needed for Nurtec. Key: [id number]. Awaiting determination from [place].

## 2022-07-04 NOTE — Telephone Encounter (Signed)
error 

## 2022-07-04 NOTE — Telephone Encounter (Signed)
Pt called back. Stated she is still taking medication and need PA. Pt stated she has the same insurance. Pt is going to upload her insurance card on my chart

## 2022-07-13 ENCOUNTER — Other Ambulatory Visit: Payer: Self-pay | Admitting: *Deleted

## 2022-07-13 MED ORDER — NURTEC 75 MG PO TBDP: 75.0000 mg | ORAL_TABLET | Freq: Every day | ORAL | 11 refills | Status: DC | PRN

## 2022-07-25 NOTE — Telephone Encounter (Signed)
LVM if pt calls back, schedule an appt. and if Dr. Juanita Craver is not pcp anymore to update.

## 2022-07-25 NOTE — Telephone Encounter (Signed)
Received a fax from [place] has been approved by only for a quantity of 8 tablets per 30 days (not 16 tablets as requested). Approval lasts through 07/03/23. Reference # [id number]

## 2022-07-31 DIAGNOSIS — F411 Generalized anxiety disorder: Secondary | ICD-10-CM | POA: Diagnosis not present

## 2022-07-31 DIAGNOSIS — F330 Major depressive disorder, recurrent, mild: Secondary | ICD-10-CM | POA: Diagnosis not present

## 2022-07-31 DIAGNOSIS — F519 Sleep disorder not due to a substance or known physiological condition, unspecified: Secondary | ICD-10-CM | POA: Diagnosis not present

## 2022-07-31 DIAGNOSIS — Z79899 Other long term (current) drug therapy: Secondary | ICD-10-CM | POA: Diagnosis not present

## 2022-08-09 ENCOUNTER — Other Ambulatory Visit: Payer: Self-pay | Admitting: Chiropractic Medicine

## 2022-08-09 ENCOUNTER — Ambulatory Visit
Admission: RE | Admit: 2022-08-09 | Discharge: 2022-08-09 | Disposition: A | Discharge status: Home / Self Care | Payer: BC Managed Care – PPO | Source: Ambulatory Visit | Attending: Chiropractic Medicine | Admitting: Chiropractic Medicine

## 2022-08-09 ENCOUNTER — Other Ambulatory Visit: Payer: BC Managed Care – PPO

## 2022-08-09 DIAGNOSIS — M2560 Stiffness of unspecified joint, not elsewhere classified: Secondary | ICD-10-CM | POA: Diagnosis not present

## 2022-08-09 DIAGNOSIS — M542 Cervicalgia: Secondary | ICD-10-CM

## 2022-08-09 DIAGNOSIS — M9903 Segmental and somatic dysfunction of lumbar region: Secondary | ICD-10-CM | POA: Diagnosis not present

## 2022-08-09 DIAGNOSIS — M545 Low back pain, unspecified: Secondary | ICD-10-CM

## 2022-08-09 DIAGNOSIS — M549 Dorsalgia, unspecified: Secondary | ICD-10-CM | POA: Diagnosis not present

## 2022-08-09 DIAGNOSIS — R102 Pelvic and perineal pain: Secondary | ICD-10-CM | POA: Diagnosis not present

## 2022-08-09 DIAGNOSIS — M9902 Segmental and somatic dysfunction of thoracic region: Secondary | ICD-10-CM | POA: Diagnosis not present

## 2022-08-09 DIAGNOSIS — M546 Pain in thoracic spine: Secondary | ICD-10-CM | POA: Diagnosis not present

## 2022-08-09 DIAGNOSIS — M9901 Segmental and somatic dysfunction of cervical region: Secondary | ICD-10-CM | POA: Diagnosis not present

## 2022-08-09 DIAGNOSIS — M9905 Segmental and somatic dysfunction of pelvic region: Secondary | ICD-10-CM | POA: Diagnosis not present

## 2022-08-14 DIAGNOSIS — M9905 Segmental and somatic dysfunction of pelvic region: Secondary | ICD-10-CM | POA: Diagnosis not present

## 2022-08-14 DIAGNOSIS — M546 Pain in thoracic spine: Secondary | ICD-10-CM | POA: Diagnosis not present

## 2022-08-14 DIAGNOSIS — M2560 Stiffness of unspecified joint, not elsewhere classified: Secondary | ICD-10-CM | POA: Diagnosis not present

## 2022-08-14 DIAGNOSIS — M62830 Muscle spasm of back: Secondary | ICD-10-CM | POA: Diagnosis not present

## 2022-08-14 DIAGNOSIS — M542 Cervicalgia: Secondary | ICD-10-CM | POA: Diagnosis not present

## 2022-08-14 DIAGNOSIS — M9902 Segmental and somatic dysfunction of thoracic region: Secondary | ICD-10-CM | POA: Diagnosis not present

## 2022-08-14 DIAGNOSIS — M9901 Segmental and somatic dysfunction of cervical region: Secondary | ICD-10-CM | POA: Diagnosis not present

## 2022-08-14 DIAGNOSIS — M9903 Segmental and somatic dysfunction of lumbar region: Secondary | ICD-10-CM | POA: Diagnosis not present

## 2022-08-16 DIAGNOSIS — M546 Pain in thoracic spine: Secondary | ICD-10-CM | POA: Diagnosis not present

## 2022-08-16 DIAGNOSIS — M2560 Stiffness of unspecified joint, not elsewhere classified: Secondary | ICD-10-CM | POA: Diagnosis not present

## 2022-08-16 DIAGNOSIS — M9902 Segmental and somatic dysfunction of thoracic region: Secondary | ICD-10-CM | POA: Diagnosis not present

## 2022-08-16 DIAGNOSIS — M9903 Segmental and somatic dysfunction of lumbar region: Secondary | ICD-10-CM | POA: Diagnosis not present

## 2022-08-16 DIAGNOSIS — M9905 Segmental and somatic dysfunction of pelvic region: Secondary | ICD-10-CM | POA: Diagnosis not present

## 2022-08-16 DIAGNOSIS — M9901 Segmental and somatic dysfunction of cervical region: Secondary | ICD-10-CM | POA: Diagnosis not present

## 2022-08-16 DIAGNOSIS — M545 Low back pain, unspecified: Secondary | ICD-10-CM | POA: Diagnosis not present

## 2022-08-21 DIAGNOSIS — J029 Acute pharyngitis, unspecified: Secondary | ICD-10-CM | POA: Diagnosis not present

## 2022-08-22 NOTE — Progress Notes (Signed)
PATIENT: Misty Ross DOB: 1990/12/06  REASON FOR VISIT: follow up HISTORY FROM: patient  Virtual Visit via Telephone Note  I connected with Georgiann Hahn on 08/29/22 at  8:00 AM EDT by telephone and verified that I am speaking with the correct person using two identifiers.   I discussed the limitations, risks, security and privacy concerns of performing an evaluation and management service by telephone and the availability of in person appointments. I also discussed with the patient that there may be a patient responsible charge related to this service. The patient expressed understanding and agreed to proceed.   History of Present Illness:  08/29/22 ALL: Misty Ross is a 32 y.o. female here today for follow up for migraines. She continues Ajovy. Nurtec not covered any longer. She is using rizatriptan for abortive therapy. She averages about 15 headache days with at least 8-9 migrainous days. She describes pain as mostly pressure. Rizatriptan works fairly well. She is seeing a chiropractor for neck pain. Lamotrigine increased to 150mg  daily and she continues metoprolol 75mg  daily.   PSG 05/17/2022 did not show any specific concerns of sleep breathing disorder. MSLT cancelled because of insufficient total sleep time of 5hr .   Gemtesa prescribed through urology.   From a thorough review of records, medications tried that can be used in migraine management include Ajovy, metoprolol(>6 months), nortriptyline (side effects), topiramate (> 6 months did not work), trazodone, Lamictal, escitalopram oxylate, Fioricet, Goody powder, Excedrin, ibuprofen, samples of Ubrelvy and Nurtec (not covered)  History (copied from Dr Trevor Mace previous note)  01/09/2022: Follow up for migraines and fatigue. Narcolepsy panel negative. Ajovy approved. Sleep team consulted for fatigue. The Ajovy shot has been fantastic. She is still getting a few headaches but overall everything is excellent. When she does get a  migraine at lest 50% improved and frequency is > 50% improved. Nurtec worked great, had samples, discussed acute management. Has 6 migraine days a month now and < 10 total headache days a month so will prescribe Nurtec. Also only been on Ajovy for 3 months, maximum efficacy in 6 months so will continue to get better. No other focal neurologic deficits, associated symptoms, inciting events or modifiable factors. Answered questions, discussed acute medications options, had samples of nurtec and ubrelvy and nurtec worked better.   HPI:  Misty Ross is a 32 y.o. female here as requested by Carilyn Goodpasture, NP for intractable migraines.  History of overactive bladder, panic attacks, tobacco use, anxiety, essential hypertension, excessive sleepiness, intractable migraine with aura without status migrainosus, anxiety, panic attacks, has a psychiatrist and therapist.  I reviewed Sabino Gasser notes which states patient is having dizziness, fatigue, tinnitus and migraines, she also has a history of significant anxiety, on her last appointment on September 08, 2021 they discussed the dizziness and migraines, her lab work in February 2023 were unremarkable, no evidence anemia, electrolyte imbalance, infection or thyroid disorders.  She was seen in [place] by neurologist.  She was previously giving samples by neurologist for prophylactic medication but never went back, she recently quit her job because of significant fatigue and excessive sleepiness.  She feels that she can sleep all the time, migraines lasting for days, she has daily migraines and they are constant and her vision is blurry, she does not take any medication for her migraines, she was last told by her neurologist not to take daily medications (I suspect this was analgesics and not prevention) she will periodically take over-the-counter medications and states this helps  much, her last eye exam was 5 years ago and she sporadically wears glasses, she is in bed  "all of the time and unable to work" per her mother, she requested temporary disability from work and mother is overwhelmed because he is paying on the bills for the daughter, she also reports joint stiffness.  She also has a psychiatrist and has seen them, she is taking half a Klonopin in the evenings and also think she has fibromyalgia I reviewed general examination which was normal and neurologic examination which was normal.  As an aside patient's "passing out" is laying in bed and falling asleep without realizing it, primary care did not think that this warranted aggressive evaluation at this time could be caused by medication she is taking in regular sleep schedule such as Klonopin and trazodone.   Per patient: Started having migraines around the age of 68. Migraines start behind her left eye, no precipitating events, occurring daily, worsening, start behind the left eye and migrate to the left temporal lobe, can occur on the right side of her head as well, persistent blurry vision of her left eye regardless of headaches(better with glasses, but she does get blurry vision with the headaches/migraines, states she saw an optometrist and had an exam, she reports severe headaches every day with photophobia, phonophobia and nausea vomiting, excessive fatigue,  hurts to move, sitting in a dark and pillow over eyes, and mood impairment because of headaches and not being able to work.  Migraines can be 10 out of 10 in pain and they are at least moderate to severe. They can last for days, weather affects her. She stopped taking fioricet and goodys. HUrts to move. Also has vertigo, she gets dizzy with and without migraines, worse when bending over,has morning headaches. Misty Ross and nurtec works well. No other focal neurologic deficits, associated symptoms, inciting events or modifiable factors. Father is with her and provides much information as well.   Sleeping a lot, she can sleep for days and awake for a few  hours just to eat and then goes back to bed, unknown if snoring, excessive daytime somnolence, can fall asleep in a lobby, she worries about driving too long and falling asleep, worsening, ongoing and worsening since February he sleeps excessive amounts.    Reviewed notes, labs and imaging from outside physicians, which showed: I reviewed a note from neurology Dr. Rosalyn Gess, frequent headaches caused her to lose her job, but 2 years ago she started getting headaches and found out that she had hypertension, she was started on medication for blood pressure which helped the headaches for a while but over the last 2 months she has had a headache every day (this is from an appointment in February 2021) PCP tried Fioricet, she reported at this appointment frequent headaches bilateral temples or behind eyes, primarily left side most severe patient describes as a band around her head, throbbing patient's most severe headaches and given a pain score of 10 out of 10, headaches are associated with phonophobia, photophobia, nausea, vomiting, presence of aura.   Migraines start behind her left eye, no precipitating events, occurring daily, start behind the left eye and migrate to the left temporal lobe, can occur on the right side of her head as well, persistent blurry vision of her left eye regardless of headaches, states she saw an optometrist for this who said she had "scarring" of her eye from high blood pressure, she reports severe headaches every day with photophobia, phonophobia and nausea  vomiting, excessive fatigue, and mood impairment because of headaches and not being able to work.  Migraines can be 10 out of 10 in pain and they are at least moderate to severe.  Taking Lopressor for elevated blood pressure which is also a migraine medication.  She often wakes up with a pounding sensation in her chest, she does not check her blood pressure at home, at the time she was taking Fioricet every 4 hours and she had tried  over-the-counter NSAIDs with no relief and Dr. Rosalyn Gess did discuss medication overuse headache with Fioricet and patient has stopped taking that apparently.  They tried Topamax and a prednisone taper to help while she decreases Fioricet.  MRI of the brain was normal.  A referral was placed to cardiology for hypertension work-up and complaining of chest pain.  She was started on topiramate and given Ubrelvy and Nurtec samples and a Medrol Dosepak.  It appears she went twice last appointment July 24, 2019 and did not return.   Taken by primary care include thyroid, CBC with differential, CMP, B12, vitamin D, ESR, ANA, CRP, CPK: Drawn in February 2023, TSH was normal, vitamin D was low at 23.8, CMP was unremarkable with BUN 19 and creatinine 0.97, CBC was unremarkable, B12 425, sed rate normal 20, CRP 5 normal, CK 57 normal, ANA IFA negative.   From a thorough review of records, medications tried that can be used in migraine management include metoprolol(>6 months), trazodone, Lamictal, escitalopram oxylate, Fioricet, Goody powder, Excedrin, ibuprofen, samples of Ubrelvy and Nurtec, nortriptyline(side effects), topiramate (> 6 months did not work)   I reviewed MRI of the brain order date 07/18/2019 I have reports no images but notes say MRI negative for any acute or abnormal findings, result normal.  Referring doctor was Irine Seal.  Dr. Heidi Dach neurologic exam was normal diagnosed with chronic migraine without aura, essential hypertension, Topamax (was on it for more than 3 months did not notice any improvements), prednisone taper (did not help)   Observations/Objective:  Generalized: Well developed, in no acute distress  Mentation: Alert oriented to time, place, history taking. Follows all commands speech and language fluent   Assessment and Plan:  32 y.o. year old female  has a past medical history of ADHD (attention deficit hyperactivity disorder), Allergy, Anxiety, Depression, Migraine, and  Seizures (HCC). here with    ICD-10-CM   1. Migraine without aura and without status migrainosus, not intractable  G43.009     2. Chronic migraine without aura, with intractable migraine, so stated, with status migrainosus  G43.711 Fremanezumab-vfrm (AJOVY) 225 MG/1.5ML SOAJ      Kerly has noted some improvement of migraine intensity with Ajovy, however, continues to have at least 15 headache days with 8-9 migraines per month. She would like to start Botox. We will continue Ajovy and rizatriptan for now. Healthy lifestyle habits encouraged. She will follow up pending Botox approval.   No orders of the defined types were placed in this encounter.   Meds ordered this encounter  Medications   Fremanezumab-vfrm (AJOVY) 225 MG/1.5ML SOAJ    Sig: Inject 225 mg into the skin every 30 (thirty) days.    Dispense:  3 mL    Refill:  3    Order Specific Question:   Supervising Provider    Answer:   Anson Fret [id number]   rizatriptan (MAXALT-MLT) 10 MG disintegrating tablet    Sig: Take 1 tablet (10 mg total) by mouth as needed for migraine. May repeat in  2 hours if needed. Take right at onset of migraine    Dispense:  9 tablet    Refill:  11    Order Specific Question:   Supervising Provider    Answer:   Anson Fret [[id number]]     Follow Up Instructions:  I discussed the assessment and treatment plan with the patient. The patient was provided an opportunity to ask questions and all were answered. The patient agreed with the plan and demonstrated an understanding of the instructions.   The patient was advised to call back or seek an in-person evaluation if the symptoms worsen or if the condition fails to improve as anticipated.  I provided 15 minutes of non-face-to-face time during this encounter. Patient located at their place of residence during Mychart visit. Provider is in the office.    Shawnie Dapper, NP

## 2022-08-22 NOTE — Patient Instructions (Signed)
Below is our plan:  We will continue Ajovy and rizatriptan for now. I will start paperwork to get Botox approved.   Please make sure you are staying well hydrated. I recommend 50-60 ounces daily. Well balanced diet and regular exercise encouraged. Consistent sleep schedule with 6-8 hours recommended.   Please continue follow up with care team as directed.   Follow up with me pending Botox approval.   You may receive a survey regarding today's visit. I encourage you to leave honest feed back as I do use this information to improve patient care. Thank you for seeing me today!   GENERAL HEADACHE INFORMATION:   Natural supplements: Magnesium Oxide or Magnesium Glycinate 500 mg at bed (up to 800 mg daily) Coenzyme Q10 300 mg in AM Vitamin B2- 200 mg twice a day   Add 1 supplement at a time since even natural supplements can have undesirable side effects. You can sometimes buy supplements cheaper (especially Coenzyme Q10) at www.WebmailGuide.co.zapuritan.com or at [employer].   Vitamins and herbs that show potential:   Magnesium: Magnesium (250 mg twice a day or 500 mg at bed) has a relaxant effect on smooth muscles such as blood vessels. Individuals suffering from frequent or daily headache usually have low magnesium levels which can be increase with daily supplementation of 400-750 mg. Three trials found 40-90% average headache reduction  when used as a preventative. Magnesium also demonstrated the benefit in menstrually related migraine.  Magnesium is part of the messenger system in the serotonin cascade and it is a good muscle relaxant.  It is also useful for constipation which can be a side effect of other medications used to treat migraine. Good sources include nuts, whole grains, and tomatoes. Side Effects: loose stool/diarrhea  Riboflavin (vitamin B 2) 200 mg twice a day. This vitamin assists nerve cells in the production of ATP a principal energy storing molecule.  It is necessary for many chemical reactions  in the body.  There have been at least 3 clinical trials of riboflavin using 400 mg per day all of which suggested that migraine frequency can be decreased.  All 3 trials showed significant improvement in over half of migraine sufferers.  The supplement is found in bread, cereal, milk, meat, and poultry.  Most Americans get more riboflavin than the recommended daily allowance, however riboflavin deficiency is not necessary for the supplements to help prevent headache. Side effects: energizing, green urine   Coenzyme Q10: This is present in almost all cells in the body and is critical component for the conversion of energy.  Recent studies have shown that a nutritional supplement of CoQ10 can reduce the frequency of migraine attacks by improving the energy production of cells as with riboflavin.  Doses of 150 mg twice a day have been shown to be effective.   Melatonin: Increasing evidence shows correlation between melatonin secretion and headache conditions.  Melatonin supplementation has decreased headache intensity and duration.  It is widely used as a sleep aid.  Sleep is natures way of dealing with migraine.  A dose of 3 mg is recommended to start for headaches including cluster headache. Higher doses up to 15 mg has been reviewed for use in Cluster headache and have been used. The rationale behind using melatonin for cluster is that many theories regarding the cause of Cluster headache center around the disruption of the normal circadian rhythm in the brain.  This helps restore the normal circadian rhythm.   HEADACHE DIET: Foods and beverages which may  trigger migraine Note that only 20% of headache patients are food sensitive. You will know if you are food sensitive if you get a headache consistently 20 minutes to 2 hours after eating a certain food. Only cut out a food if it causes headaches, otherwise you might remove foods you enjoy! What matters most for diet is to eat a well balanced healthy diet  full of vegetables and low fat protein, and to not miss meals.   Chocolate, other sweets ALL cheeses except cottage and cream cheese Dairy products, yogurt, sour cream, ice cream Liver Meat extracts (Bovril, Marmite, meat tenderizers) Meats or fish which have undergone aging, fermenting, pickling or smoking. These include: Hotdogs,salami,Lox,sausage, mortadellas,smoked salmon, pepperoni, Pickled herring Pods of broad bean (English beans, Chinese pea pods, [place] (fava) beans, lima and navy beans Ripe avocado, ripe banana Yeast extracts or active yeast preparations such as Brewer's or Fleishman's (commercial bakes goods are permitted) Tomato based foods, pizza (lasagna, etc.)   MSG (monosodium glutamate) is disguised as many things; look for these common aliases: Monopotassium glutamate Autolysed yeast Hydrolysed protein Sodium caseinate "flavorings" "all natural preservatives" Nutrasweet   Avoid all other foods that convincingly provoke headaches.   Resources: The Dizzy Adair LaundryCook, Heal Your Headache Diet, migrainestrong.com  https://zamora-andrews.com/https://www.migrainestrong.com/heal-your-headache-diet-the-migraine-diet/   Caffeine and Migraine For patients that have migraine, caffeine intake more than 3 days per week can lead to dependency and increased migraine frequency. I would recommend cutting back on your caffeine intake as best you can. The recommended amount of caffeine is 200-300 mg daily, although migraine patients may experience dependency at even lower doses. While you may notice an increase in headache temporarily, cutting back will be helpful for headaches in the long run. For more information on caffeine and migraine, visit: https://americanmigrainefoundation.org/resource-library/caffeine-and-migraine/   Headache Prevention Strategies:   1. Maintain a headache diary; learn to identify and avoid triggers.  - This can be a simple note where you log when you had a headache, associated symptoms,  and medications used - There are several smartphone apps developed to help track migraines: Migraine Buddy, Migraine Monitor, Curelator N1-Headache App   Common triggers include: Emotional triggers: Emotional/Upset family or friends Emotional/Upset occupation Business reversal/success Anticipation anxiety Crisis-serious Post-crisis periodNew job/position   Physical triggers: Vacation Day Weekend Strenuous Exercise High Altitude Location New Move Menstrual Day Physical Illness Oversleep/Not enough sleep Weather changes Light: Photophobia or light sesnitivity treatment involves a balance between desensitization and reduction in overly strong input. Use dark polarized glasses outside, but not inside. Avoid bright or fluorescent light, but do not dim environment to the point that going into a normally lit room hurts. Consider FL-41 tint lenses, which reduce the most irritating wavelengths without blocking too much light.  These can be obtained at axonoptics.com or theraspecs.com Foods: see list above.   2. Limit use of acute treatments (over-the-counter medications, triptans, etc.) to no more than 2 days per week or 10 days per month to prevent medication overuse headache (rebound headache).     3. Follow a regular schedule (including weekends and holidays): Don't skip meals. Eat a balanced diet. 8 hours of sleep nightly. Minimize stress. Exercise 30 minutes per day. Being overweight is associated with a 5 times increased risk of chronic migraine. Keep well hydrated and drink 6-8 glasses of water per day.   4. Initiate non-pharmacologic measures at the earliest onset of your headache. Rest and quiet environment. Relax and reduce stress. Breathe2Relax is a free app that can instruct you on  some simple relaxtion and breathing techniques. Http://Dawnbuse.com is a    free website that provides teaching videos on relaxation.  Also, there are  many apps that   can be downloaded for  "mindful" relaxation.  An app called YOGA NIDRA will help walk you through mindfulness. Another app called Calm can be downloaded to give you a structured mindfulness guide with daily reminders and skill development. Headspace for guided meditation Mindfulness Based Stress Reduction Online Course: www.palousemindfulness.com Cold compresses.   5. Don't wait!! Take the maximum allowable dosage of prescribed medication at the first sign of migraine.   6. Compliance:  Take prescribed medication regularly as directed and at the first sign of a migraine.   7. Communicate:  Call your physician when problems arise, especially if your headaches change, increase in frequency/severity, or become associated with neurological symptoms (weakness, numbness, slurred speech, etc.).   8. Headache/pain management therapies: Consider various complementary methods, including medication, behavioral therapy, psychological counselling, biofeedback, massage therapy, acupuncture, dry needling, and other modalities.  Such measures may reduce the need for medications. Counseling for pain management, where patients learn to function and ignore/minimize their pain, seems to work very well.   9. Recommend changing family's attention and focus away from patient's headaches. Instead, emphasize daily activities. If first question of day is 'How are your headaches/Do you have a headache today?', then patient will constantly think about headaches, thus making them worse. Goal is to re-direct attention away from headaches, toward daily activities and other distractions.   10. Helpful Websites: www.AmericanHeadacheSociety.org PatentHood.ch www.headaches.org TightMarket.nl www.achenet.org

## 2022-08-28 DIAGNOSIS — M9903 Segmental and somatic dysfunction of lumbar region: Secondary | ICD-10-CM | POA: Diagnosis not present

## 2022-08-28 DIAGNOSIS — F330 Major depressive disorder, recurrent, mild: Secondary | ICD-10-CM | POA: Diagnosis not present

## 2022-08-28 DIAGNOSIS — M9901 Segmental and somatic dysfunction of cervical region: Secondary | ICD-10-CM | POA: Diagnosis not present

## 2022-08-28 DIAGNOSIS — M9905 Segmental and somatic dysfunction of pelvic region: Secondary | ICD-10-CM | POA: Diagnosis not present

## 2022-08-28 DIAGNOSIS — M9902 Segmental and somatic dysfunction of thoracic region: Secondary | ICD-10-CM | POA: Diagnosis not present

## 2022-08-28 DIAGNOSIS — M546 Pain in thoracic spine: Secondary | ICD-10-CM | POA: Diagnosis not present

## 2022-08-28 DIAGNOSIS — F902 Attention-deficit hyperactivity disorder, combined type: Secondary | ICD-10-CM | POA: Diagnosis not present

## 2022-08-28 DIAGNOSIS — F411 Generalized anxiety disorder: Secondary | ICD-10-CM | POA: Diagnosis not present

## 2022-08-28 DIAGNOSIS — M2560 Stiffness of unspecified joint, not elsewhere classified: Secondary | ICD-10-CM | POA: Diagnosis not present

## 2022-08-28 DIAGNOSIS — F519 Sleep disorder not due to a substance or known physiological condition, unspecified: Secondary | ICD-10-CM | POA: Diagnosis not present

## 2022-08-28 DIAGNOSIS — M545 Low back pain, unspecified: Secondary | ICD-10-CM | POA: Diagnosis not present

## 2022-08-29 ENCOUNTER — Telehealth (INDEPENDENT_AMBULATORY_CARE_PROVIDER_SITE_OTHER): Payer: BC Managed Care – PPO | Admitting: Family Medicine

## 2022-08-29 ENCOUNTER — Telehealth: Payer: Self-pay | Admitting: *Deleted

## 2022-08-29 ENCOUNTER — Encounter: Payer: Self-pay | Admitting: Family Medicine

## 2022-08-29 DIAGNOSIS — G43711 Chronic migraine without aura, intractable, with status migrainosus: Secondary | ICD-10-CM

## 2022-08-29 DIAGNOSIS — G43009 Migraine without aura, not intractable, without status migrainosus: Secondary | ICD-10-CM | POA: Diagnosis not present

## 2022-08-29 MED ORDER — RIZATRIPTAN BENZOATE 10 MG PO TBDP: 10.0000 mg | ORAL_TABLET | ORAL | 11 refills | Status: DC | PRN

## 2022-08-29 MED ORDER — AJOVY 225 MG/1.5ML ~~LOC~~ SOAJ: 225.0000 mg | SUBCUTANEOUS | 3 refills | Status: DC

## 2022-08-29 NOTE — Telephone Encounter (Signed)
Needs Botox auth. New start.   Chronic Migraine CPT 64615    Botox J0585 Units:200   G43.711 Chronic Migraine without aura, intractable, with status migrainous  

## 2022-08-30 DIAGNOSIS — M9901 Segmental and somatic dysfunction of cervical region: Secondary | ICD-10-CM | POA: Diagnosis not present

## 2022-08-30 DIAGNOSIS — M542 Cervicalgia: Secondary | ICD-10-CM | POA: Diagnosis not present

## 2022-08-30 DIAGNOSIS — M9905 Segmental and somatic dysfunction of pelvic region: Secondary | ICD-10-CM | POA: Diagnosis not present

## 2022-08-30 DIAGNOSIS — M545 Low back pain, unspecified: Secondary | ICD-10-CM | POA: Diagnosis not present

## 2022-08-30 DIAGNOSIS — M9902 Segmental and somatic dysfunction of thoracic region: Secondary | ICD-10-CM | POA: Diagnosis not present

## 2022-08-30 DIAGNOSIS — M25512 Pain in left shoulder: Secondary | ICD-10-CM | POA: Diagnosis not present

## 2022-08-30 DIAGNOSIS — M9903 Segmental and somatic dysfunction of lumbar region: Secondary | ICD-10-CM | POA: Diagnosis not present

## 2022-08-30 DIAGNOSIS — M9907 Segmental and somatic dysfunction of upper extremity: Secondary | ICD-10-CM | POA: Diagnosis not present

## 2022-08-30 DIAGNOSIS — M2560 Stiffness of unspecified joint, not elsewhere classified: Secondary | ICD-10-CM | POA: Diagnosis not present

## 2022-09-01 ENCOUNTER — Other Ambulatory Visit: Payer: Self-pay | Admitting: Family Medicine

## 2022-09-01 ENCOUNTER — Other Ambulatory Visit (HOSPITAL_COMMUNITY): Payer: Self-pay

## 2022-09-01 DIAGNOSIS — G43711 Chronic migraine without aura, intractable, with status migrainosus: Secondary | ICD-10-CM

## 2022-09-01 NOTE — Telephone Encounter (Signed)
   Benefit Verification [id number] Submitted!

## 2022-09-01 NOTE — Telephone Encounter (Addendum)
Pharmacy Patient Advocate Encounter   Received notification from [facility] that prior authorization for Botox 200UNIT solution is required/requested.   PA submitted on 09/01/2022 to (ins) [facility] [employer] via [employer] or (Medicaid) confirmation # [id number]  Had to renew Pa and submit clinical questions on 09/15/2022  Status is pending

## 2022-09-06 DIAGNOSIS — M2560 Stiffness of unspecified joint, not elsewhere classified: Secondary | ICD-10-CM | POA: Diagnosis not present

## 2022-09-06 DIAGNOSIS — M546 Pain in thoracic spine: Secondary | ICD-10-CM | POA: Diagnosis not present

## 2022-09-06 DIAGNOSIS — M9903 Segmental and somatic dysfunction of lumbar region: Secondary | ICD-10-CM | POA: Diagnosis not present

## 2022-09-06 DIAGNOSIS — M542 Cervicalgia: Secondary | ICD-10-CM | POA: Diagnosis not present

## 2022-09-06 DIAGNOSIS — M9905 Segmental and somatic dysfunction of pelvic region: Secondary | ICD-10-CM | POA: Diagnosis not present

## 2022-09-06 DIAGNOSIS — M9901 Segmental and somatic dysfunction of cervical region: Secondary | ICD-10-CM | POA: Diagnosis not present

## 2022-09-06 DIAGNOSIS — M9902 Segmental and somatic dysfunction of thoracic region: Secondary | ICD-10-CM | POA: Diagnosis not present

## 2022-09-06 DIAGNOSIS — M5414 Radiculopathy, thoracic region: Secondary | ICD-10-CM | POA: Diagnosis not present

## 2022-09-08 DIAGNOSIS — M542 Cervicalgia: Secondary | ICD-10-CM | POA: Diagnosis not present

## 2022-09-08 DIAGNOSIS — M9905 Segmental and somatic dysfunction of pelvic region: Secondary | ICD-10-CM | POA: Diagnosis not present

## 2022-09-08 DIAGNOSIS — M9901 Segmental and somatic dysfunction of cervical region: Secondary | ICD-10-CM | POA: Diagnosis not present

## 2022-09-08 DIAGNOSIS — M9903 Segmental and somatic dysfunction of lumbar region: Secondary | ICD-10-CM | POA: Diagnosis not present

## 2022-09-08 DIAGNOSIS — M9902 Segmental and somatic dysfunction of thoracic region: Secondary | ICD-10-CM | POA: Diagnosis not present

## 2022-09-08 DIAGNOSIS — M50122 Cervical disc disorder at C5-C6 level with radiculopathy: Secondary | ICD-10-CM | POA: Diagnosis not present

## 2022-09-08 DIAGNOSIS — M546 Pain in thoracic spine: Secondary | ICD-10-CM | POA: Diagnosis not present

## 2022-09-08 DIAGNOSIS — M62830 Muscle spasm of back: Secondary | ICD-10-CM | POA: Diagnosis not present

## 2022-09-13 DIAGNOSIS — M9902 Segmental and somatic dysfunction of thoracic region: Secondary | ICD-10-CM | POA: Diagnosis not present

## 2022-09-13 DIAGNOSIS — M546 Pain in thoracic spine: Secondary | ICD-10-CM | POA: Diagnosis not present

## 2022-09-13 DIAGNOSIS — M9905 Segmental and somatic dysfunction of pelvic region: Secondary | ICD-10-CM | POA: Diagnosis not present

## 2022-09-13 DIAGNOSIS — R202 Paresthesia of skin: Secondary | ICD-10-CM | POA: Diagnosis not present

## 2022-09-13 DIAGNOSIS — M9901 Segmental and somatic dysfunction of cervical region: Secondary | ICD-10-CM | POA: Diagnosis not present

## 2022-09-13 DIAGNOSIS — M2560 Stiffness of unspecified joint, not elsewhere classified: Secondary | ICD-10-CM | POA: Diagnosis not present

## 2022-09-13 DIAGNOSIS — M50122 Cervical disc disorder at C5-C6 level with radiculopathy: Secondary | ICD-10-CM | POA: Diagnosis not present

## 2022-09-13 DIAGNOSIS — M9903 Segmental and somatic dysfunction of lumbar region: Secondary | ICD-10-CM | POA: Diagnosis not present

## 2022-09-15 ENCOUNTER — Other Ambulatory Visit (HOSPITAL_COMMUNITY): Payer: Self-pay

## 2022-09-18 NOTE — Telephone Encounter (Signed)
[employer] requested add info-sent info.

## 2022-09-20 ENCOUNTER — Other Ambulatory Visit (HOSPITAL_COMMUNITY): Payer: Self-pay

## 2022-09-20 DIAGNOSIS — M4040 Postural lordosis, site unspecified: Secondary | ICD-10-CM | POA: Diagnosis not present

## 2022-09-20 DIAGNOSIS — M542 Cervicalgia: Secondary | ICD-10-CM | POA: Diagnosis not present

## 2022-09-20 DIAGNOSIS — M50122 Cervical disc disorder at C5-C6 level with radiculopathy: Secondary | ICD-10-CM | POA: Diagnosis not present

## 2022-09-20 DIAGNOSIS — M546 Pain in thoracic spine: Secondary | ICD-10-CM | POA: Diagnosis not present

## 2022-09-20 DIAGNOSIS — M2560 Stiffness of unspecified joint, not elsewhere classified: Secondary | ICD-10-CM | POA: Diagnosis not present

## 2022-09-20 DIAGNOSIS — M9901 Segmental and somatic dysfunction of cervical region: Secondary | ICD-10-CM | POA: Diagnosis not present

## 2022-09-22 ENCOUNTER — Other Ambulatory Visit (HOSPITAL_COMMUNITY): Payer: Self-pay

## 2022-09-25 ENCOUNTER — Other Ambulatory Visit (HOSPITAL_COMMUNITY): Payer: Self-pay

## 2022-09-25 NOTE — Telephone Encounter (Signed)
Jillian/Angel- please call pt to schedule botox appt

## 2022-09-25 NOTE — Telephone Encounter (Signed)
Scheduled for 12/21/22 and added to wait list.

## 2022-09-25 NOTE — Telephone Encounter (Signed)
Pharmacy Patient Advocate Encounter- Botox BIV-Medical Benefit:  J code: [id number] CPT code: 60454 Dx Code: G43.711  PA was submitted to [employer] and has been approved through: 03/05/2023 Authorization# PA Case ID #: [id number]  Buy and Bill  Patient Is eligible for Botox Copay Card. Copay Card can make patient's cost as little as zero.

## 2022-09-26 DIAGNOSIS — F519 Sleep disorder not due to a substance or known physiological condition, unspecified: Secondary | ICD-10-CM | POA: Diagnosis not present

## 2022-09-26 DIAGNOSIS — F902 Attention-deficit hyperactivity disorder, combined type: Secondary | ICD-10-CM | POA: Diagnosis not present

## 2022-09-26 DIAGNOSIS — F411 Generalized anxiety disorder: Secondary | ICD-10-CM | POA: Diagnosis not present

## 2022-09-26 DIAGNOSIS — F331 Major depressive disorder, recurrent, moderate: Secondary | ICD-10-CM | POA: Diagnosis not present

## 2022-10-08 DIAGNOSIS — J40 Bronchitis, not specified as acute or chronic: Secondary | ICD-10-CM | POA: Diagnosis not present

## 2022-10-08 DIAGNOSIS — J019 Acute sinusitis, unspecified: Secondary | ICD-10-CM | POA: Diagnosis not present

## 2022-10-08 DIAGNOSIS — H66002 Acute suppurative otitis media without spontaneous rupture of ear drum, left ear: Secondary | ICD-10-CM | POA: Diagnosis not present

## 2022-11-20 DIAGNOSIS — I10 Essential (primary) hypertension: Secondary | ICD-10-CM | POA: Diagnosis not present

## 2022-11-20 DIAGNOSIS — F411 Generalized anxiety disorder: Secondary | ICD-10-CM | POA: Diagnosis not present

## 2022-11-20 DIAGNOSIS — E559 Vitamin D deficiency, unspecified: Secondary | ICD-10-CM | POA: Diagnosis not present

## 2022-11-20 DIAGNOSIS — G43119 Migraine with aura, intractable, without status migrainosus: Secondary | ICD-10-CM | POA: Diagnosis not present

## 2022-12-01 DIAGNOSIS — F519 Sleep disorder not due to a substance or known physiological condition, unspecified: Secondary | ICD-10-CM | POA: Diagnosis not present

## 2022-12-01 DIAGNOSIS — F411 Generalized anxiety disorder: Secondary | ICD-10-CM | POA: Diagnosis not present

## 2022-12-01 DIAGNOSIS — F902 Attention-deficit hyperactivity disorder, combined type: Secondary | ICD-10-CM | POA: Diagnosis not present

## 2022-12-01 DIAGNOSIS — F331 Major depressive disorder, recurrent, moderate: Secondary | ICD-10-CM | POA: Diagnosis not present

## 2022-12-20 NOTE — Progress Notes (Unsigned)
12/21/22 ALL: Misty Ross presents for first Botox procedure. She continues Ajovy and rizatriptan. She has 15 headache days with 8-9 migraines each month on current therapy.     Consent Form Botulism Toxin Injection For Chronic Migraine    Reviewed orally with patient, additionally signature is on file:  Botulism toxin has been approved by the Federal drug administration for treatment of chronic migraine. Botulism toxin does not cure chronic migraine and it may not be effective in some patients.  The administration of botulism toxin is accomplished by injecting a small amount of toxin into the muscles of the neck and head. Dosage must be titrated for each individual. Any benefits resulting from botulism toxin tend to wear off after 3 months with a repeat injection required if benefit is to be maintained. Injections are usually done every 3-4 months with maximum effect peak achieved by about 2 or 3 weeks. Botulism toxin is expensive and you should be sure of what costs you will incur resulting from the injection.  The side effects of botulism toxin use for chronic migraine may include:   -Transient, and usually mild, facial weakness with facial injections  -Transient, and usually mild, head or neck weakness with head/neck injections  -Reduction or loss of forehead facial animation due to forehead muscle weakness  -Eyelid drooping  -Dry eye  -Pain at the site of injection or bruising at the site of injection  -Double vision  -Potential unknown long term risks   Contraindications: You should not have Botox if you are pregnant, nursing, allergic to albumin, have an infection, skin condition, or muscle weakness at the site of the injection, or have myasthenia gravis, Lambert-Eaton syndrome, or ALS.  It is also possible that as with any injection, there may be an allergic reaction or no effect from the medication. Reduced effectiveness after repeated injections is sometimes seen and rarely  infection at the injection site may occur. All care will be taken to prevent these side effects. If therapy is given over a long time, atrophy and wasting in the muscle injected may occur. Occasionally the patient's become refractory to treatment because they develop antibodies to the toxin. In this event, therapy needs to be modified.  I have read the above information and consent to the administration of botulism toxin.    BOTOX PROCEDURE NOTE FOR MIGRAINE HEADACHE  Contraindications and precautions discussed with patient(above). Aseptic procedure was observed and patient tolerated procedure. Procedure performed by Shawnie Dapper, FNP-C.   The condition has existed for more than 6 months, and pt does not have a diagnosis of ALS, Myasthenia Gravis or Lambert-Eaton Syndrome.  Risks and benefits of injections discussed and pt agrees to proceed with the procedure.  Written consent obtained  These injections are medically necessary. Pt  receives good benefits from these injections. These injections do not cause sedations or hallucinations which the oral therapies may cause.   Description of procedure:  The patient was placed in a sitting position. The standard protocol was used for Botox as follows, with 5 units of Botox injected at each site:  -Procerus muscle, midline injection  -Corrugator muscle, bilateral injection  -Frontalis muscle, bilateral injection, with 2 sites each side, medial injection was performed in the upper one third of the frontalis muscle, in the region vertical from the medial inferior edge of the superior orbital rim. The lateral injection was again in the upper one third of the forehead vertically above the lateral limbus of the cornea, 1.5 cm lateral  to the medial injection site.  -Temporalis muscle injection, 4 sites, bilaterally. The first injection was 3 cm above the tragus of the ear, second injection site was 1.5 cm to 3 cm up from the first injection site in line with  the tragus of the ear. The third injection site was 1.5-3 cm forward between the first 2 injection sites. The fourth injection site was 1.5 cm posterior to the second injection site. 5th site laterally in the temporalis  muscleat the level of the outer canthus.  -Occipitalis muscle injection, 3 sites, bilaterally. The first injection was done one half way between the occipital protuberance and the tip of the mastoid process behind the ear. The second injection site was done lateral and superior to the first, 1 fingerbreadth from the first injection. The third injection site was 1 fingerbreadth superiorly and medially from the first injection site.  -Cervical paraspinal muscle injection, 2 sites, bilaterally. The first injection site was 1 cm from the midline of the cervical spine, 3 cm inferior to the lower border of the occipital protuberance. The second injection site was 1.5 cm superiorly and laterally to the first injection site.  -Trapezius muscle injection was performed at 3 sites, bilaterally. The first injection site was in the upper trapezius muscle halfway between the inflection point of the neck, and the acromion. The second injection site was one half way between the acromion and the first injection site. The third injection was done between the first injection site and the inflection point of the neck.   Will return for repeat injection in 3 months.   A total of 200 units of Botox was prepared, 155 units of Botox was injected as documented above, any Botox not injected was wasted. The patient tolerated the procedure well, there were no complications of the above procedure.

## 2022-12-21 ENCOUNTER — Ambulatory Visit (INDEPENDENT_AMBULATORY_CARE_PROVIDER_SITE_OTHER): Payer: BC Managed Care – PPO | Admitting: Family Medicine

## 2022-12-21 DIAGNOSIS — G43E19 Chronic migraine with aura, intractable, without status migrainosus: Secondary | ICD-10-CM

## 2022-12-21 MED ORDER — ONABOTULINUMTOXINA 200 UNITS IJ SOLR
155.0000 [IU] | Freq: Once | INTRAMUSCULAR | Status: AC
  Administered 2022-12-21: 155 [IU] via INTRAMUSCULAR

## 2022-12-21 NOTE — Progress Notes (Signed)
Botox- 200 units x 1 vial Lot: [id number] Expiration:04/2025 NDC: 0023-3921-02  Bacteriostatic 0.9% Sodium Chloride- * mL  Lot: [id number] Expiration: 03/22/2024 NDC:0409-1966-02  Dx:G43.E19 B/B Witnessed [id number] Basilio [place], [place]

## 2022-12-23 DIAGNOSIS — R12 Heartburn: Secondary | ICD-10-CM | POA: Diagnosis not present

## 2022-12-23 DIAGNOSIS — R5383 Other fatigue: Secondary | ICD-10-CM | POA: Diagnosis not present

## 2022-12-26 DIAGNOSIS — F519 Sleep disorder not due to a substance or known physiological condition, unspecified: Secondary | ICD-10-CM | POA: Diagnosis not present

## 2022-12-26 DIAGNOSIS — F331 Major depressive disorder, recurrent, moderate: Secondary | ICD-10-CM | POA: Diagnosis not present

## 2022-12-26 DIAGNOSIS — F902 Attention-deficit hyperactivity disorder, combined type: Secondary | ICD-10-CM | POA: Diagnosis not present

## 2022-12-26 DIAGNOSIS — F411 Generalized anxiety disorder: Secondary | ICD-10-CM | POA: Diagnosis not present

## 2022-12-27 DIAGNOSIS — F331 Major depressive disorder, recurrent, moderate: Secondary | ICD-10-CM | POA: Diagnosis not present

## 2022-12-27 DIAGNOSIS — F519 Sleep disorder not due to a substance or known physiological condition, unspecified: Secondary | ICD-10-CM | POA: Diagnosis not present

## 2022-12-27 DIAGNOSIS — F411 Generalized anxiety disorder: Secondary | ICD-10-CM | POA: Diagnosis not present

## 2022-12-27 DIAGNOSIS — F902 Attention-deficit hyperactivity disorder, combined type: Secondary | ICD-10-CM | POA: Diagnosis not present

## 2022-12-28 DIAGNOSIS — K219 Gastro-esophageal reflux disease without esophagitis: Secondary | ICD-10-CM | POA: Diagnosis not present

## 2022-12-28 DIAGNOSIS — R159 Full incontinence of feces: Secondary | ICD-10-CM | POA: Diagnosis not present

## 2022-12-28 DIAGNOSIS — R197 Diarrhea, unspecified: Secondary | ICD-10-CM | POA: Diagnosis not present

## 2023-01-08 DIAGNOSIS — M5416 Radiculopathy, lumbar region: Secondary | ICD-10-CM | POA: Diagnosis not present

## 2023-01-08 DIAGNOSIS — M7061 Trochanteric bursitis, right hip: Secondary | ICD-10-CM | POA: Diagnosis not present

## 2023-01-10 ENCOUNTER — Other Ambulatory Visit: Payer: Self-pay | Admitting: Neurology

## 2023-01-10 DIAGNOSIS — F331 Major depressive disorder, recurrent, moderate: Secondary | ICD-10-CM | POA: Diagnosis not present

## 2023-01-10 DIAGNOSIS — F411 Generalized anxiety disorder: Secondary | ICD-10-CM | POA: Diagnosis not present

## 2023-01-10 DIAGNOSIS — F902 Attention-deficit hyperactivity disorder, combined type: Secondary | ICD-10-CM | POA: Diagnosis not present

## 2023-01-10 DIAGNOSIS — F519 Sleep disorder not due to a substance or known physiological condition, unspecified: Secondary | ICD-10-CM | POA: Diagnosis not present

## 2023-01-12 DIAGNOSIS — K5909 Other constipation: Secondary | ICD-10-CM | POA: Diagnosis not present

## 2023-01-12 DIAGNOSIS — R1319 Other dysphagia: Secondary | ICD-10-CM | POA: Diagnosis not present

## 2023-01-12 DIAGNOSIS — R140 Abdominal distension (gaseous): Secondary | ICD-10-CM | POA: Diagnosis not present

## 2023-01-12 DIAGNOSIS — R072 Precordial pain: Secondary | ICD-10-CM | POA: Diagnosis not present

## 2023-01-15 NOTE — Telephone Encounter (Signed)
Rx refilled.

## 2023-01-19 ENCOUNTER — Other Ambulatory Visit: Payer: Self-pay

## 2023-01-19 ENCOUNTER — Emergency Department (HOSPITAL_BASED_OUTPATIENT_CLINIC_OR_DEPARTMENT_OTHER)
Admission: EM | Admit: 2023-01-19 | Discharge: 2023-01-19 | Disposition: A | Discharge status: Home / Self Care | Payer: BC Managed Care – PPO | Attending: Emergency Medicine | Admitting: Emergency Medicine

## 2023-01-19 ENCOUNTER — Emergency Department (HOSPITAL_BASED_OUTPATIENT_CLINIC_OR_DEPARTMENT_OTHER): Payer: BC Managed Care – PPO

## 2023-01-19 ENCOUNTER — Encounter (HOSPITAL_BASED_OUTPATIENT_CLINIC_OR_DEPARTMENT_OTHER): Payer: Self-pay | Admitting: Emergency Medicine

## 2023-01-19 DIAGNOSIS — M545 Low back pain, unspecified: Secondary | ICD-10-CM | POA: Diagnosis not present

## 2023-01-19 DIAGNOSIS — R1011 Right upper quadrant pain: Secondary | ICD-10-CM

## 2023-01-19 DIAGNOSIS — K5900 Constipation, unspecified: Secondary | ICD-10-CM | POA: Insufficient documentation

## 2023-01-19 LAB — URINALYSIS, ROUTINE W REFLEX MICROSCOPIC
Bilirubin Urine: NEGATIVE
Glucose, UA: NEGATIVE mg/dL
Hgb urine dipstick: NEGATIVE
Ketones, ur: NEGATIVE mg/dL
Leukocytes,Ua: NEGATIVE
Nitrite: NEGATIVE
Protein, ur: NEGATIVE mg/dL
Specific Gravity, Urine: <1.005 — ABNORMAL LOW (ref 1.005–1.030)
pH: 6.5 (ref 5.0–8.0)

## 2023-01-19 LAB — CBC
HCT: 39 % (ref 36.0–46.0)
Hemoglobin: 12.8 g/dL (ref 12.0–15.0)
MCH: 29.6 pg (ref 26.0–34.0)
MCHC: 32.8 g/dL (ref 30.0–36.0)
MCV: 90.1 fL (ref 80.0–100.0)
Platelets: 250 10*3/uL (ref 150–400)
RBC: 4.33 MIL/uL (ref 3.87–5.11)
RDW: 12.6 % (ref 11.5–15.5)
WBC: 11.8 10*3/uL — ABNORMAL HIGH (ref 4.0–10.5)
nRBC: 0 % (ref 0.0–0.2)

## 2023-01-19 LAB — COMPREHENSIVE METABOLIC PANEL
ALT: 13 U/L (ref 0–44)
AST: 16 U/L (ref 15–41)
Albumin: 4.3 g/dL (ref 3.5–5.0)
Alkaline Phosphatase: 34 U/L — ABNORMAL LOW (ref 38–126)
Anion gap: 10 (ref 5–15)
BUN: 13 mg/dL (ref 6–20)
CO2: 28 mmol/L (ref 22–32)
Calcium: 9.2 mg/dL (ref 8.9–10.3)
Chloride: 100 mmol/L (ref 98–111)
Creatinine, Ser: 1.02 mg/dL — ABNORMAL HIGH (ref 0.44–1.00)
GFR, Estimated: >60 mL/min (ref >60)
Glucose, Bld: 93 mg/dL (ref 70–99)
Potassium: 4.4 mmol/L (ref 3.5–5.1)
Sodium: 138 mmol/L (ref 135–145)
Total Bilirubin: 0.5 mg/dL (ref 0.3–1.2)
Total Protein: 6.6 g/dL (ref 6.5–8.1)

## 2023-01-19 LAB — RAPID URINE DRUG SCREEN, HOSP PERFORMED
Amphetamines: NOT DETECTED
Barbiturates: NOT DETECTED
Benzodiazepines: POSITIVE — AB
Cocaine: NOT DETECTED
Opiates: NOT DETECTED
Tetrahydrocannabinol: NOT DETECTED

## 2023-01-19 LAB — PREGNANCY, URINE: Preg Test, Ur: NEGATIVE

## 2023-01-19 LAB — LIPASE, BLOOD: Lipase: 10 U/L — ABNORMAL LOW (ref 11–51)

## 2023-01-19 MED ORDER — KETOROLAC TROMETHAMINE 30 MG/ML IJ SOLN
30.0000 mg | Freq: Once | INTRAMUSCULAR | Status: AC
  Administered 2023-01-19: 30 mg via INTRAVENOUS
  Filled 2023-01-19: qty 1

## 2023-01-19 NOTE — Discharge Instructions (Signed)
1.  At this time your CT scan does not show evidence of problems with your gallbladder or appendix.  Your scan suggests constipation.  Start taking MiraLAX over-the-counter daily per package instructions until you are having normal bowel movement. 2.  Continue to work with a gastroenterologist. 3.  Return to the emergency department if you get a fever, worsening or changing pain or other concerning symptoms.

## 2023-01-19 NOTE — ED Notes (Signed)
Spoke with lab to add on UDS

## 2023-01-19 NOTE — ED Notes (Signed)
 RN reviewed discharge instructions with pt. Pt verbalized understanding and had no further questions. VSS upon discharge.  

## 2023-01-19 NOTE — ED Notes (Signed)
Patient transported to CT 

## 2023-01-19 NOTE — ED Provider Notes (Signed)
[place] EMERGENCY DEPARTMENT AT [facility] Provider Note   CSN: [id number] Arrival date & time: 01/19/23  1548     History  Chief Complaint  Patient presents with   Abdominal Pain    Misty Ross is a 32 y.o. female.  HPI Patient reports that she just finished a Medrol Dosepak for lower back pain on the right that was radiating down towards her hip.  She reports that that seemed improved.  She reports this morning she woke up and did not feel very good with some abdominal pain.  She reports that the abdominal pain is more in her upper abdomen and somewhat into the upper back.  Reports she feels nauseated with it.  It is worse with certain movements.  This is different than the pain that she had previously had in her lower back.  She reports she has not had anything to eat today because she was not feeling well.  No pain burning or urgency with urination.  No problems that she knows of with her gallbladder.  No lower extremity swelling or calf pain.  No shortness of breath.    Home Medications Prior to Admission medications   Medication Sig Start Date End Date Taking? Authorizing Provider  clonazePAM (KLONOPIN) 0.5 MG tablet Take 0.5 mg by mouth as needed. 09/06/21   [provider]  escitalopram (LEXAPRO) 20 MG tablet Take 20 mg by mouth daily. 08/22/21   [provider]  Fremanezumab-vfrm (AJOVY) 225 MG/1.5ML SOAJ Inject 225 mg into the skin every 30 (thirty) days. 08/29/22   Lomax, Amy, NP  gabapentin (NEURONTIN) 100 MG capsule Take 100 mg by mouth 3 (three) times daily.    [provider]  lamoTRIgine (LAMICTAL) 25 MG tablet 1 tablet    [provider]  metoprolol tartrate (LOPRESSOR) 50 MG tablet Take 75 mg by mouth daily. 09/27/21   [provider]  norethindrone (MICRONOR) 0.35 MG tablet Take 1 tablet by mouth daily. 08/23/21   [provider]  ondansetron (ZOFRAN-ODT) 4 MG disintegrating tablet TAKE 1-2 TABLETS (4-8 MG  TOTAL) BY MOUTH EVERY 8 (EIGHT) HOURS AS NEEDED. GREAT FOR NAUSEA OR MOTION SICKNESS. CAN ALSO TAKE IT WITH RIZATRIPTAN OR NURTEC FOR MIGRAINES 01/15/23   Anson Fret, MD  Rimegepant Sulfate (NURTEC) 75 MG TBDP Take 1 tablet (75 mg total) by mouth daily as needed. For migraines. Take as close to onset of migraine as possible. One daily maximum. 07/13/22   Anson Fret, MD  rizatriptan (MAXALT-MLT) 10 MG disintegrating tablet Take 1 tablet (10 mg total) by mouth as needed for migraine. May repeat in 2 hours if needed. Take right at onset of migraine 08/29/22   Lomax, Amy, NP  Vibegron (GEMTESA) 75 MG TABS Take 75 mg by mouth daily. 02/28/22   Dohmeier, Porfirio Mylar, MD  Vitamin D, Ergocalciferol, (DRISDOL) 1.25 MG (50000 UNIT) CAPS capsule Take 50,000 Units by mouth once a week. 09/13/21   [provider]      Allergies    Benadryl [diphenhydramine]    Review of Systems   Review of Systems  Physical Exam Updated Vital Signs BP 130/79   Pulse 71   Temp 98.3 F (36.8 C) (Oral)   Resp 18   Ht 4\' 11"  (1.499 m)   Wt 70.3 kg   SpO2 98%   BMI 31.31 kg/m  Physical Exam Constitutional:      Comments: Nontoxic.  No respiratory distress.  Mental status clear.  HENT:  Mouth/Throat:     Pharynx: Oropharynx is clear.  Eyes:     Extraocular Movements: Extraocular movements intact.  Cardiovascular:     Rate and Rhythm: Normal rate and regular rhythm.  Pulmonary:     Effort: Pulmonary effort is normal.     Breath sounds: Normal breath sounds.  Abdominal:     Comments: Right upper quadrant tender to palpation.  Lower abdomen nontender.  Tenderness in CVA area on the right.  No rashes or soft tissue changes.  Musculoskeletal:        General: No swelling or tenderness. Normal range of motion.     Right lower leg: No edema.     Left lower leg: No edema.  Skin:    General: Skin is warm and dry.  Neurological:     General: No focal deficit present.     Mental Status: She is oriented  to person, place, and time.     Motor: No weakness.     Coordination: Coordination normal.     ED Results / Procedures / Treatments   Labs (all labs ordered are listed, but only abnormal results are displayed) Labs Reviewed  LIPASE, BLOOD - Abnormal; Notable for the following components:      Result Value   Lipase 10 (*)    All other components within normal limits  COMPREHENSIVE METABOLIC PANEL - Abnormal; Notable for the following components:   Creatinine, Ser 1.02 (*)    Alkaline Phosphatase 34 (*)    All other components within normal limits  CBC - Abnormal; Notable for the following components:   WBC 11.8 (*)    All other components within normal limits  URINALYSIS, ROUTINE W REFLEX MICROSCOPIC - Abnormal; Notable for the following components:   Color, Urine COLORLESS (*)    Specific Gravity, Urine <1.005 (*)    All other components within normal limits  RAPID URINE DRUG SCREEN, HOSP PERFORMED - Abnormal; Notable for the following components:   Benzodiazepines POSITIVE (*)    All other components within normal limits  PREGNANCY, URINE    EKG None  Radiology CT Renal Stone Study  Result Date: 01/19/2023 CLINICAL DATA:  Right upper quadrant pain EXAM: CT ABDOMEN AND PELVIS WITHOUT CONTRAST TECHNIQUE: Multidetector CT imaging of the abdomen and pelvis was performed following the standard protocol without IV contrast. RADIATION DOSE REDUCTION: This exam was performed according to the departmental dose-optimization program which includes automated exposure control, adjustment of the mA and/or kV according to patient size and/or use of iterative reconstruction technique. COMPARISON:  None Available. FINDINGS: Lower chest: Linear scarring or atelectasis in the lung bases. No effusions. Hepatobiliary: No focal hepatic abnormality. Gallbladder unremarkable. Pancreas: No focal abnormality or ductal dilatation. Spleen: No focal abnormality.  Normal size. Adrenals/Urinary Tract: No  adrenal abnormality. No focal renal abnormality. No stones or hydronephrosis. Urinary bladder is unremarkable. Stomach/Bowel: Moderate stool burden throughout the colon. Normal appendix. Stomach and small bowel decompressed. Vascular/Lymphatic: No evidence of aneurysm or adenopathy. Reproductive: Uterus and adnexa unremarkable.  No mass. Other: No free fluid or free air. Musculoskeletal: No acute bony abnormality. IMPRESSION: Large stool burden throughout the colon. No stones crash that no renal or ureteral stones. No hydronephrosis. No acute findings. Electronically Signed   By: Charlett Nose M.D.   On: 01/19/2023 20:14   US Abdomen Limited  Result Date: 01/19/2023 CLINICAL DATA:  Right upper quadrant pain. EXAM: ULTRASOUND ABDOMEN LIMITED RIGHT UPPER QUADRANT COMPARISON:  None Available. FINDINGS: Gallbladder: No gallstones or wall thickening  visualized. No pericholecystic fluid. The sonographer reported patient tenderness when the ultrasound probe was pressed on the patient's gallbladder, a positive sonographic Murphy's sign. Common bile duct: Diameter: 2 mm, within normal limits. No intrahepatic or extrahepatic biliary ductal dilatation. Liver: No focal lesion identified. Within normal limits in parenchymal echogenicity. Portal vein is patent on color Doppler imaging with normal direction of blood flow towards the liver. Other: None. IMPRESSION: Normal ultrasound appearance of the gallbladder without gallstones or inflammatory change. The sonographer reported a "positive sonographic Murphy's sign." Recommend clinical correlation. Electronically Signed   By: Neita Garnet M.D.   On: 01/19/2023 19:11    Procedures Procedures    Medications Ordered in ED Medications  ketorolac (TORADOL) 30 MG/ML injection 30 mg (30 mg Intravenous Given 01/19/23 1935)    ED Course/ Medical Decision Making/ A&P                                 Medical Decision Making Amount and/or Complexity of Data Reviewed Labs:  ordered. Radiology: ordered.  Risk Prescription drug management.   Patient has had onset of right upper quadrant pain with some associated flank pain today.  She denies similar history of pain.  Patient is nontoxic.  No vomiting or diarrhea associated.  Differential diagnosis includes kidney stone\pyelonephritis\biliary colic\cholecystitis\gastritis or peptic ulcer disease\lower pulmonary etiology such as pneumonia or PE.  At this time I have low suspicion for pneumonia or PE.  Patient does not have any lower extremity swelling or calf pain and no shortness of breath or chest pain.  Will proceed with ultrasound gallbladder as patient is predominantly tender to palpation right upper quadrant.  Gallbladder ultrasound no acute findings.  Urinalysis normal.  Complete metabolic panel with LFTs within normal limits.  Lipase 10.  White count 11.8.  Normal H&H.  CT abdomen pelvis normal except stool burden suggestive of constipation.  Patient reports that she has had decrease stool output since Monday and no bowel movement.  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  She is in the process of getting worked up with gastroenterology.  She reports she does vacillate from having diarrheal stool to constipation.  At this time we discussed the plan of using MiraLAX with constipation and she will continue to follow-up with GI.  We reviewed return precautions.  Patient is agreeable with plan.          Final Clinical Impression(s) / ED Diagnoses Final diagnoses:  Right upper quadrant abdominal pain  Constipation, unspecified constipation type    Rx / DC Orders ED Discharge Orders     None         Arby Barrette, MD 01/19/23 2040

## 2023-01-19 NOTE — ED Triage Notes (Signed)
Pt arrives to ED with c/o RUQ abdominal pain that started today.

## 2023-01-19 NOTE — ED Notes (Signed)
Patient transported to US 

## 2023-01-23 ENCOUNTER — Ambulatory Visit
Admission: RE | Admit: 2023-01-23 | Discharge: 2023-01-23 | Disposition: A | Discharge status: Home / Self Care | Payer: BC Managed Care – PPO | Source: Ambulatory Visit | Attending: Gastroenterology | Admitting: Gastroenterology

## 2023-01-23 ENCOUNTER — Other Ambulatory Visit: Payer: Self-pay | Admitting: Neurology

## 2023-01-23 ENCOUNTER — Other Ambulatory Visit: Payer: Self-pay | Admitting: Gastroenterology

## 2023-01-23 DIAGNOSIS — R1011 Right upper quadrant pain: Secondary | ICD-10-CM | POA: Diagnosis not present

## 2023-01-23 DIAGNOSIS — R350 Frequency of micturition: Secondary | ICD-10-CM | POA: Diagnosis not present

## 2023-01-23 DIAGNOSIS — K5900 Constipation, unspecified: Secondary | ICD-10-CM

## 2023-01-23 DIAGNOSIS — R1031 Right lower quadrant pain: Secondary | ICD-10-CM | POA: Diagnosis not present

## 2023-01-23 DIAGNOSIS — R109 Unspecified abdominal pain: Secondary | ICD-10-CM | POA: Diagnosis not present

## 2023-01-23 DIAGNOSIS — R102 Pelvic and perineal pain: Secondary | ICD-10-CM | POA: Diagnosis not present

## 2023-01-24 DIAGNOSIS — F519 Sleep disorder not due to a substance or known physiological condition, unspecified: Secondary | ICD-10-CM | POA: Diagnosis not present

## 2023-01-24 DIAGNOSIS — F331 Major depressive disorder, recurrent, moderate: Secondary | ICD-10-CM | POA: Diagnosis not present

## 2023-01-24 DIAGNOSIS — F411 Generalized anxiety disorder: Secondary | ICD-10-CM | POA: Diagnosis not present

## 2023-01-24 DIAGNOSIS — F902 Attention-deficit hyperactivity disorder, combined type: Secondary | ICD-10-CM | POA: Diagnosis not present

## 2023-01-25 NOTE — Telephone Encounter (Signed)
LVM  for pt to call back to discuss refill request

## 2023-01-26 DIAGNOSIS — M545 Low back pain, unspecified: Secondary | ICD-10-CM | POA: Diagnosis not present

## 2023-01-26 DIAGNOSIS — K219 Gastro-esophageal reflux disease without esophagitis: Secondary | ICD-10-CM | POA: Diagnosis not present

## 2023-01-26 DIAGNOSIS — R1011 Right upper quadrant pain: Secondary | ICD-10-CM | POA: Diagnosis not present

## 2023-01-29 ENCOUNTER — Telehealth: Payer: Self-pay

## 2023-01-29 ENCOUNTER — Telehealth: Payer: Self-pay | Admitting: Family Medicine

## 2023-01-29 ENCOUNTER — Other Ambulatory Visit (HOSPITAL_COMMUNITY): Payer: Self-pay

## 2023-01-29 NOTE — Telephone Encounter (Signed)
Pt called and LVM wanting to know the update on her Nurtec Rx. Please advise.

## 2023-01-29 NOTE — Telephone Encounter (Signed)
Pharmacy Patient Advocate Encounter   Received notification from Physician's Office that prior authorization for Nurtec 75MG  dispersible tablets is required/requested.   Insurance verification completed.   The patient is insured through [facility] .   Per test claim: PA required; PA started via CoverMyMeds. KEY [id number] . Waiting for clinical questions to populate.

## 2023-01-29 NOTE — Telephone Encounter (Signed)
PA request has been Submitted. New Encounter created for follow up. For additional info see Pharmacy Prior Auth telephone encounter from 01/29/2023.

## 2023-01-29 NOTE — Telephone Encounter (Signed)
Nurtec pa needed

## 2023-01-31 NOTE — Telephone Encounter (Signed)
Clinical questions have been submitted-awaiting determination. 

## 2023-02-06 DIAGNOSIS — F902 Attention-deficit hyperactivity disorder, combined type: Secondary | ICD-10-CM | POA: Diagnosis not present

## 2023-02-06 DIAGNOSIS — F411 Generalized anxiety disorder: Secondary | ICD-10-CM | POA: Diagnosis not present

## 2023-02-06 DIAGNOSIS — F331 Major depressive disorder, recurrent, moderate: Secondary | ICD-10-CM | POA: Diagnosis not present

## 2023-02-06 DIAGNOSIS — F519 Sleep disorder not due to a substance or known physiological condition, unspecified: Secondary | ICD-10-CM | POA: Diagnosis not present

## 2023-02-07 DIAGNOSIS — K2980 Duodenitis without bleeding: Secondary | ICD-10-CM | POA: Diagnosis not present

## 2023-02-07 DIAGNOSIS — R1310 Dysphagia, unspecified: Secondary | ICD-10-CM | POA: Diagnosis not present

## 2023-02-07 DIAGNOSIS — K2930 Chronic superficial gastritis without bleeding: Secondary | ICD-10-CM | POA: Diagnosis not present

## 2023-02-07 DIAGNOSIS — K2289 Other specified disease of esophagus: Secondary | ICD-10-CM | POA: Diagnosis not present

## 2023-02-07 DIAGNOSIS — K3189 Other diseases of stomach and duodenum: Secondary | ICD-10-CM | POA: Diagnosis not present

## 2023-02-09 ENCOUNTER — Other Ambulatory Visit (HOSPITAL_COMMUNITY): Payer: Self-pay

## 2023-02-09 NOTE — Telephone Encounter (Signed)
Pharmacy Patient Advocate Encounter  Received notification from [facility] that Prior Authorization for Nurtec has been APPROVED from 02/02/2023 to 07/03/2023. Ran test claim, Copay is $0.00. This test claim was processed through [facility]- copay amounts may vary at other pharmacies due to pharmacy/plan contracts, or as the patient moves through the different stages of their insurance plan.   PA #/Case ID/Reference #: Covered for 8 tablets/23 days

## 2023-02-12 DIAGNOSIS — Z6830 Body mass index (BMI) 30.0-30.9, adult: Secondary | ICD-10-CM | POA: Diagnosis not present

## 2023-02-12 DIAGNOSIS — Z01419 Encounter for gynecological examination (general) (routine) without abnormal findings: Secondary | ICD-10-CM | POA: Diagnosis not present

## 2023-02-17 ENCOUNTER — Emergency Department (HOSPITAL_BASED_OUTPATIENT_CLINIC_OR_DEPARTMENT_OTHER)
Admission: EM | Admit: 2023-02-17 | Discharge: 2023-02-17 | Disposition: A | Discharge status: Home / Self Care | Payer: BC Managed Care – PPO | Attending: Emergency Medicine | Admitting: Emergency Medicine

## 2023-02-17 ENCOUNTER — Encounter (HOSPITAL_BASED_OUTPATIENT_CLINIC_OR_DEPARTMENT_OTHER): Payer: Self-pay

## 2023-02-17 DIAGNOSIS — M25512 Pain in left shoulder: Secondary | ICD-10-CM | POA: Diagnosis not present

## 2023-02-17 DIAGNOSIS — R52 Pain, unspecified: Secondary | ICD-10-CM

## 2023-02-17 DIAGNOSIS — M791 Myalgia, unspecified site: Secondary | ICD-10-CM | POA: Diagnosis not present

## 2023-02-17 DIAGNOSIS — Z79899 Other long term (current) drug therapy: Secondary | ICD-10-CM | POA: Diagnosis not present

## 2023-02-17 DIAGNOSIS — M79604 Pain in right leg: Secondary | ICD-10-CM | POA: Diagnosis not present

## 2023-02-17 DIAGNOSIS — M545 Low back pain, unspecified: Secondary | ICD-10-CM | POA: Insufficient documentation

## 2023-02-17 DIAGNOSIS — M79642 Pain in left hand: Secondary | ICD-10-CM | POA: Insufficient documentation

## 2023-02-17 DIAGNOSIS — M25511 Pain in right shoulder: Secondary | ICD-10-CM | POA: Diagnosis not present

## 2023-02-17 DIAGNOSIS — M79641 Pain in right hand: Secondary | ICD-10-CM | POA: Diagnosis not present

## 2023-02-17 DIAGNOSIS — D649 Anemia, unspecified: Secondary | ICD-10-CM | POA: Diagnosis not present

## 2023-02-17 DIAGNOSIS — M7989 Other specified soft tissue disorders: Secondary | ICD-10-CM | POA: Diagnosis not present

## 2023-02-17 DIAGNOSIS — M79605 Pain in left leg: Secondary | ICD-10-CM | POA: Diagnosis not present

## 2023-02-17 HISTORY — DX: Essential (primary) hypertension: I10

## 2023-02-17 LAB — COMPREHENSIVE METABOLIC PANEL
ALT: 28 U/L (ref 0–44)
AST: 36 U/L (ref 15–41)
Albumin: 3.5 g/dL (ref 3.5–5.0)
Alkaline Phosphatase: 30 U/L — ABNORMAL LOW (ref 38–126)
Anion gap: 5 (ref 5–15)
BUN: 15 mg/dL (ref 6–20)
CO2: 28 mmol/L (ref 22–32)
Calcium: 8.7 mg/dL — ABNORMAL LOW (ref 8.9–10.3)
Chloride: 105 mmol/L (ref 98–111)
Creatinine, Ser: 1 mg/dL (ref 0.44–1.00)
GFR, Estimated: >60 mL/min (ref >60)
Glucose, Bld: 98 mg/dL (ref 70–99)
Potassium: 4.2 mmol/L (ref 3.5–5.1)
Sodium: 138 mmol/L (ref 135–145)
Total Bilirubin: 0.3 mg/dL (ref 0.3–1.2)
Total Protein: 5.3 g/dL — ABNORMAL LOW (ref 6.5–8.1)

## 2023-02-17 LAB — CBC WITH DIFFERENTIAL/PLATELET
Abs Immature Granulocytes: 0.01 10*3/uL (ref 0.00–0.07)
Basophils Absolute: 0 10*3/uL (ref 0.0–0.1)
Basophils Relative: 0 %
Eosinophils Absolute: 0.2 10*3/uL (ref 0.0–0.5)
Eosinophils Relative: 3 %
HCT: 34 % — ABNORMAL LOW (ref 36.0–46.0)
Hemoglobin: 11.3 g/dL — ABNORMAL LOW (ref 12.0–15.0)
Immature Granulocytes: 0 %
Lymphocytes Relative: 30 %
Lymphs Abs: 1.4 10*3/uL (ref 0.7–4.0)
MCH: 29.2 pg (ref 26.0–34.0)
MCHC: 33.2 g/dL (ref 30.0–36.0)
MCV: 87.9 fL (ref 80.0–100.0)
Monocytes Absolute: 0.6 10*3/uL (ref 0.1–1.0)
Monocytes Relative: 13 %
Neutro Abs: 2.6 10*3/uL (ref 1.7–7.7)
Neutrophils Relative %: 54 %
Platelets: 242 10*3/uL (ref 150–400)
RBC: 3.87 MIL/uL (ref 3.87–5.11)
RDW: 12.2 % (ref 11.5–15.5)
WBC: 4.8 10*3/uL (ref 4.0–10.5)
nRBC: 0 % (ref 0.0–0.2)

## 2023-02-17 LAB — CK: Total CK: 99 U/L (ref 38–234)

## 2023-02-17 LAB — MAGNESIUM: Magnesium: 2 mg/dL (ref 1.7–2.4)

## 2023-02-17 NOTE — ED Notes (Addendum)
Errror

## 2023-02-17 NOTE — ED Notes (Signed)
Pt attempted to give urine sample, pt unable to void att.

## 2023-02-17 NOTE — ED Notes (Signed)
Pt unable to urinate at this time, Victory Dakin PA made aware

## 2023-02-17 NOTE — ED Provider Notes (Signed)
[place] EMERGENCY DEPARTMENT AT [facility] Provider Note   CSN: [id number] Arrival date & time: 02/17/23  1212     History  Chief Complaint  Patient presents with   Multiple complaints    Anaissa Macfadden is a 32 y.o. female.  HPI     Home Medications Prior to Admission medications   Medication Sig Start Date End Date Taking? Authorizing Provider  clonazePAM (KLONOPIN) 0.5 MG tablet Take 0.5 mg by mouth as needed. 09/06/21   [provider]  escitalopram (LEXAPRO) 20 MG tablet Take 20 mg by mouth daily. 08/22/21   [provider]  Fremanezumab-vfrm (AJOVY) 225 MG/1.5ML SOAJ Inject 225 mg into the skin every 30 (thirty) days. 08/29/22   Lomax, Amy, NP  gabapentin (NEURONTIN) 100 MG capsule Take 100 mg by mouth 3 (three) times daily.    [provider]  lamoTRIgine (LAMICTAL) 25 MG tablet 1 tablet    [provider]  metoprolol tartrate (LOPRESSOR) 50 MG tablet Take 75 mg by mouth daily. 09/27/21   [provider]  norethindrone (MICRONOR) 0.35 MG tablet Take 1 tablet by mouth daily. 08/23/21   [provider]  ondansetron (ZOFRAN-ODT) 4 MG disintegrating tablet TAKE 1-2 TABLETS (4-8 MG TOTAL) BY MOUTH EVERY 8 (EIGHT) HOURS AS NEEDED. GREAT FOR NAUSEA OR MOTION SICKNESS. CAN ALSO TAKE IT WITH RIZATRIPTAN OR NURTEC FOR MIGRAINES 01/15/23   Anson Fret, MD  Rimegepant Sulfate (NURTEC) 75 MG TBDP Take 1 tablet (75 mg total) by mouth daily as needed. For migraines. Take as close to onset of migraine as possible. One daily maximum. 07/13/22   Anson Fret, MD  rizatriptan (MAXALT-MLT) 10 MG disintegrating tablet Take 1 tablet (10 mg total) by mouth as needed for migraine. May repeat in 2 hours if needed. Take right at onset of migraine 08/29/22   Lomax, Amy, NP  Vibegron (GEMTESA) 75 MG TABS Take 75 mg by mouth daily. 02/28/22   Dohmeier, Porfirio Mylar, MD  Vitamin D, Ergocalciferol, (DRISDOL) 1.25 MG (50000 UNIT) CAPS capsule Take 50,000  Units by mouth once a week. 09/13/21   [provider]      Allergies    Benadryl [diphenhydramine]    Review of Systems   Review of Systems  Physical Exam Updated Vital Signs BP 107/69 (BP Location: Right Arm)   Pulse 78   Temp 98.1 F (36.7 C) (Oral)   Resp 18   LMP  (LMP Unknown) Comment: Amenorrhea from her B.C. pills  SpO2 98%  Physical Exam  ED Results / Procedures / Treatments   Labs (all labs ordered are listed, but only abnormal results are displayed) Labs Reviewed  CBC WITH DIFFERENTIAL/PLATELET - Abnormal; Notable for the following components:      Result Value   Hemoglobin 11.3 (*)    HCT 34.0 (*)    All other components within normal limits  COMPREHENSIVE METABOLIC PANEL - Abnormal; Notable for the following components:   Calcium 8.7 (*)    Total Protein 5.3 (*)    Alkaline Phosphatase 30 (*)    All other components within normal limits  MAGNESIUM  CK  URINALYSIS, ROUTINE W REFLEX MICROSCOPIC  PREGNANCY, URINE    EKG None  Radiology No results found.  Procedures Procedures  {Document cardiac monitor, telemetry assessment procedure when appropriate:1}  Medications Ordered in ED Medications - No data to display  ED Course/ Medical Decision Making/ A&P   {   Click here for ABCD2, HEART and other calculatorsREFRESH Note  before signing :1}                              Medical Decision Making Amount and/or Complexity of Data Reviewed Labs: ordered.   32 y.o. female presents to the ER for evaluation of ***. Differential diagnosis includes but is not limited to ***. Vital signs ***. Physical exam as noted above.   I independently reviewed and interpreted the patient's labs. ***.  After consideration of the diagnostic results and the patients response to treatment, I feel that *** .   Emergency department workup does*** not suggest an emergent condition requiring admission or immediate intervention beyond what has been performed at this  time.   ***We discussed the results of the labs/imaging. The plan is ***. We discussed strict return precautions and red flag symptoms. The patient verbalized their understanding and agrees to the plan. The patient is stable and being discharged home in good condition.  ***Portions of this report may have been transcribed using voice recognition software. Every effort was made to ensure accuracy; however, inadvertent computerized transcription errors may be present.   I discussed this case with my attending physician who cosigned this note including patient's presenting symptoms, physical exam, and planned diagnostics and interventions. Attending physician stated agreement with plan or made changes to plan which were implemented.   Attending physician assessed patient at bedside.  Final Clinical Impression(s) / ED Diagnoses Final diagnoses:  Bilateral hand swelling    Rx / DC Orders ED Discharge Orders     None

## 2023-02-17 NOTE — Discharge Instructions (Addendum)
You were seen in the ER for evaluation of your hand swelling. Your lab work shows that you have some anemia. Please make sure you follow up with your PCP about this. Additionally, please make sure you are taking you medications as prescribed. If you have any worsening swelling, color changes, fevers, weakness, numbness, tingling, please return to the ER for re-evaluation. If you have any concerns, new or worsening symptoms, please return to the nearest ER for re-evaluation.   Contact a health care provider if: Your pain is not controlled by medicine. Your pain does not improve or gets worse. You have side effects from pain medicines. Get help right away if: You have severe pain. You have trouble breathing. You faint, or another person sees you faint. You have chest pain or pressure that lasts for more than a few minutes, or if you have other symptoms along with chest pain, including: Pain or discomfort in one or both arms, your back, neck, jaw, or stomach. Shortness of breath. A cold sweat. Nausea. Feeling light-headed. These symptoms may be an emergency. Get help right away. Call 911. Do not wait to see if the symptoms will go away. Do not drive yourself to the hospital.

## 2023-02-17 NOTE — ED Triage Notes (Signed)
She c/o, for about a month now a general feeling of "shakiness" combined with some generalized swelling and multiple joint and muscle aches. She denies fever, nor any other sign of current illness.

## 2023-02-24 DIAGNOSIS — M2550 Pain in unspecified joint: Secondary | ICD-10-CM | POA: Diagnosis not present

## 2023-02-27 DIAGNOSIS — M2550 Pain in unspecified joint: Secondary | ICD-10-CM | POA: Diagnosis not present

## 2023-02-27 NOTE — Telephone Encounter (Signed)
Faxed continuation form along with OV notes to [facility].

## 2023-02-27 NOTE — Telephone Encounter (Signed)
Completed [employer] PA continuation form and placed in nurse pod for NP signature.

## 2023-02-28 NOTE — Telephone Encounter (Signed)
Received approval fax from [facility]. Auth: [id number] (03/06/23-02/05/24)

## 2023-03-02 DIAGNOSIS — F331 Major depressive disorder, recurrent, moderate: Secondary | ICD-10-CM | POA: Diagnosis not present

## 2023-03-02 DIAGNOSIS — F902 Attention-deficit hyperactivity disorder, combined type: Secondary | ICD-10-CM | POA: Diagnosis not present

## 2023-03-02 DIAGNOSIS — F519 Sleep disorder not due to a substance or known physiological condition, unspecified: Secondary | ICD-10-CM | POA: Diagnosis not present

## 2023-03-02 DIAGNOSIS — F411 Generalized anxiety disorder: Secondary | ICD-10-CM | POA: Diagnosis not present

## 2023-03-07 DIAGNOSIS — F519 Sleep disorder not due to a substance or known physiological condition, unspecified: Secondary | ICD-10-CM | POA: Diagnosis not present

## 2023-03-07 DIAGNOSIS — F331 Major depressive disorder, recurrent, moderate: Secondary | ICD-10-CM | POA: Diagnosis not present

## 2023-03-07 DIAGNOSIS — F411 Generalized anxiety disorder: Secondary | ICD-10-CM | POA: Diagnosis not present

## 2023-03-07 DIAGNOSIS — F902 Attention-deficit hyperactivity disorder, combined type: Secondary | ICD-10-CM | POA: Diagnosis not present

## 2023-03-15 NOTE — Progress Notes (Deleted)
03/15/23 ALL: Misty Ross returns for second Botox procedure. She reports   12/21/2022 ALL: Misty Ross presents for first Botox procedure. She continues Ajovy and rizatriptan. She has 15 headache days with 8-9 migraines each month on current therapy.     Consent Form Botulism Toxin Injection For Chronic Migraine    Reviewed orally with patient, additionally signature is on file:  Botulism toxin has been approved by the Federal drug administration for treatment of chronic migraine. Botulism toxin does not cure chronic migraine and it may not be effective in some patients.  The administration of botulism toxin is accomplished by injecting a small amount of toxin into the muscles of the neck and head. Dosage must be titrated for each individual. Any benefits resulting from botulism toxin tend to wear off after 3 months with a repeat injection required if benefit is to be maintained. Injections are usually done every 3-4 months with maximum effect peak achieved by about 2 or 3 weeks. Botulism toxin is expensive and you should be sure of what costs you will incur resulting from the injection.  The side effects of botulism toxin use for chronic migraine may include:   -Transient, and usually mild, facial weakness with facial injections  -Transient, and usually mild, head or neck weakness with head/neck injections  -Reduction or loss of forehead facial animation due to forehead muscle weakness  -Eyelid drooping  -Dry eye  -Pain at the site of injection or bruising at the site of injection  -Double vision  -Potential unknown long term risks   Contraindications: You should not have Botox if you are pregnant, nursing, allergic to albumin, have an infection, skin condition, or muscle weakness at the site of the injection, or have myasthenia gravis, Lambert-Eaton syndrome, or ALS.  It is also possible that as with any injection, there may be an allergic reaction or no effect from the medication.  Reduced effectiveness after repeated injections is sometimes seen and rarely infection at the injection site may occur. All care will be taken to prevent these side effects. If therapy is given over a long time, atrophy and wasting in the muscle injected may occur. Occasionally the patient's become refractory to treatment because they develop antibodies to the toxin. In this event, therapy needs to be modified.  I have read the above information and consent to the administration of botulism toxin.    BOTOX PROCEDURE NOTE FOR MIGRAINE HEADACHE  Contraindications and precautions discussed with patient(above). Aseptic procedure was observed and patient tolerated procedure. Procedure performed by Shawnie Dapper, FNP-C.   The condition has existed for more than 6 months, and pt does not have a diagnosis of ALS, Myasthenia Gravis or Lambert-Eaton Syndrome.  Risks and benefits of injections discussed and pt agrees to proceed with the procedure.  Written consent obtained  These injections are medically necessary. Pt  receives good benefits from these injections. These injections do not cause sedations or hallucinations which the oral therapies may cause.   Description of procedure:  The patient was placed in a sitting position. The standard protocol was used for Botox as follows, with 5 units of Botox injected at each site:  -Procerus muscle, midline injection  -Corrugator muscle, bilateral injection  -Frontalis muscle, bilateral injection, with 2 sites each side, medial injection was performed in the upper one third of the frontalis muscle, in the region vertical from the medial inferior edge of the superior orbital rim. The lateral injection was again in the upper one third of the  forehead vertically above the lateral limbus of the cornea, 1.5 cm lateral to the medial injection site.  -Temporalis muscle injection, 4 sites, bilaterally. The first injection was 3 cm above the tragus of the ear, second  injection site was 1.5 cm to 3 cm up from the first injection site in line with the tragus of the ear. The third injection site was 1.5-3 cm forward between the first 2 injection sites. The fourth injection site was 1.5 cm posterior to the second injection site. 5th site laterally in the temporalis  muscleat the level of the outer canthus.  -Occipitalis muscle injection, 3 sites, bilaterally. The first injection was done one half way between the occipital protuberance and the tip of the mastoid process behind the ear. The second injection site was done lateral and superior to the first, 1 fingerbreadth from the first injection. The third injection site was 1 fingerbreadth superiorly and medially from the first injection site.  -Cervical paraspinal muscle injection, 2 sites, bilaterally. The first injection site was 1 cm from the midline of the cervical spine, 3 cm inferior to the lower border of the occipital protuberance. The second injection site was 1.5 cm superiorly and laterally to the first injection site.  -Trapezius muscle injection was performed at 3 sites, bilaterally. The first injection site was in the upper trapezius muscle halfway between the inflection point of the neck, and the acromion. The second injection site was one half way between the acromion and the first injection site. The third injection was done between the first injection site and the inflection point of the neck.   Will return for repeat injection in 3 months.   A total of 200 units of Botox was prepared, 155 units of Botox was injected as documented above, any Botox not injected was wasted. The patient tolerated the procedure well, there were no complications of the above procedure.

## 2023-03-20 ENCOUNTER — Ambulatory Visit: Payer: BC Managed Care – PPO | Admitting: Family Medicine

## 2023-03-20 DIAGNOSIS — G43E19 Chronic migraine with aura, intractable, without status migrainosus: Secondary | ICD-10-CM

## 2023-03-26 DIAGNOSIS — R202 Paresthesia of skin: Secondary | ICD-10-CM | POA: Diagnosis not present

## 2023-03-29 ENCOUNTER — Encounter: Payer: Self-pay | Admitting: Family Medicine

## 2023-03-31 DIAGNOSIS — F17210 Nicotine dependence, cigarettes, uncomplicated: Secondary | ICD-10-CM | POA: Diagnosis not present

## 2023-03-31 DIAGNOSIS — R451 Restlessness and agitation: Secondary | ICD-10-CM | POA: Diagnosis not present

## 2023-03-31 DIAGNOSIS — M792 Neuralgia and neuritis, unspecified: Secondary | ICD-10-CM | POA: Diagnosis not present

## 2023-03-31 DIAGNOSIS — H538 Other visual disturbances: Secondary | ICD-10-CM | POA: Diagnosis not present

## 2023-03-31 DIAGNOSIS — R202 Paresthesia of skin: Secondary | ICD-10-CM | POA: Diagnosis not present

## 2023-03-31 DIAGNOSIS — Z8619 Personal history of other infectious and parasitic diseases: Secondary | ICD-10-CM | POA: Diagnosis not present

## 2023-03-31 DIAGNOSIS — Z820 Family history of epilepsy and other diseases of the nervous system: Secondary | ICD-10-CM | POA: Diagnosis not present

## 2023-03-31 DIAGNOSIS — Z5321 Procedure and treatment not carried out due to patient leaving prior to being seen by health care provider: Secondary | ICD-10-CM | POA: Diagnosis not present

## 2023-03-31 DIAGNOSIS — R531 Weakness: Secondary | ICD-10-CM | POA: Diagnosis not present

## 2023-04-02 ENCOUNTER — Other Ambulatory Visit: Payer: Self-pay

## 2023-04-02 ENCOUNTER — Telehealth: Payer: Self-pay | Admitting: *Deleted

## 2023-04-02 NOTE — Telephone Encounter (Signed)
PA Ajovy needed per pt

## 2023-04-03 DIAGNOSIS — M545 Low back pain, unspecified: Secondary | ICD-10-CM | POA: Diagnosis not present

## 2023-04-03 DIAGNOSIS — F331 Major depressive disorder, recurrent, moderate: Secondary | ICD-10-CM | POA: Diagnosis not present

## 2023-04-03 DIAGNOSIS — F411 Generalized anxiety disorder: Secondary | ICD-10-CM | POA: Diagnosis not present

## 2023-04-03 DIAGNOSIS — M25551 Pain in right hip: Secondary | ICD-10-CM | POA: Diagnosis not present

## 2023-04-05 DIAGNOSIS — G43E19 Chronic migraine with aura, intractable, without status migrainosus: Secondary | ICD-10-CM | POA: Diagnosis not present

## 2023-04-06 MED ORDER — AJOVY 225 MG/1.5ML ~~LOC~~ SOAJ: 1.5000 mL | Freq: Once | SUBCUTANEOUS | Status: AC

## 2023-04-06 NOTE — Telephone Encounter (Signed)
Pt presented to the office to pick up a Ajovy sample. Signed out and provided to the pt

## 2023-04-10 ENCOUNTER — Other Ambulatory Visit (HOSPITAL_COMMUNITY): Payer: Self-pay

## 2023-04-10 ENCOUNTER — Telehealth: Payer: Self-pay

## 2023-04-10 NOTE — Telephone Encounter (Signed)
PA request has been Submitted. New Encounter created for follow up. For additional info see Pharmacy Prior Auth telephone encounter from 04/10/2023.

## 2023-04-10 NOTE — Telephone Encounter (Signed)
Pharmacy Patient Advocate Encounter   Received notification from Physician's Office that prior authorization for AJOVY (fremanezumab-vfrm) injection 225MG /1.5ML auto-injectors is required/requested.   Insurance verification completed.   The patient is insured through [facility] .   Per test claim: PA required; PA started via CoverMyMeds. KEY [id number] . Waiting for clinical questions to populate.

## 2023-04-11 NOTE — Telephone Encounter (Signed)
Clinical questions have been submitted-awaiting determination. 

## 2023-04-12 ENCOUNTER — Other Ambulatory Visit (HOSPITAL_COMMUNITY): Payer: Self-pay

## 2023-04-12 NOTE — Telephone Encounter (Signed)
Sent mychart to pt about approval

## 2023-04-12 NOTE — Telephone Encounter (Signed)
Pharmacy Patient Advocate Encounter  Received notification from [facility] that Prior Authorization for AJOVY (fremanezumab-vfrm) injection 225MG /1.5ML auto-injectors has been APPROVED from 04/11/2023 to 04/10/2024. Ran test claim, Copay is $0. This test claim was processed through [facility]- copay amounts may vary at other pharmacies due to pharmacy/plan contracts, or as the patient moves through the different stages of their insurance plan.   PA #/Case ID/Reference #: PA Case ID #: [id number]

## 2023-04-17 DIAGNOSIS — K219 Gastro-esophageal reflux disease without esophagitis: Secondary | ICD-10-CM | POA: Diagnosis not present

## 2023-04-17 DIAGNOSIS — F519 Sleep disorder not due to a substance or known physiological condition, unspecified: Secondary | ICD-10-CM | POA: Diagnosis not present

## 2023-04-17 DIAGNOSIS — K5909 Other constipation: Secondary | ICD-10-CM | POA: Diagnosis not present

## 2023-04-17 DIAGNOSIS — F411 Generalized anxiety disorder: Secondary | ICD-10-CM | POA: Diagnosis not present

## 2023-05-03 DIAGNOSIS — L700 Acne vulgaris: Secondary | ICD-10-CM | POA: Diagnosis not present

## 2023-05-09 DIAGNOSIS — F411 Generalized anxiety disorder: Secondary | ICD-10-CM | POA: Diagnosis not present

## 2023-05-09 DIAGNOSIS — F519 Sleep disorder not due to a substance or known physiological condition, unspecified: Secondary | ICD-10-CM | POA: Diagnosis not present

## 2023-05-09 DIAGNOSIS — F331 Major depressive disorder, recurrent, moderate: Secondary | ICD-10-CM | POA: Diagnosis not present

## 2023-05-09 DIAGNOSIS — F902 Attention-deficit hyperactivity disorder, combined type: Secondary | ICD-10-CM | POA: Diagnosis not present

## 2023-05-18 DIAGNOSIS — R609 Edema, unspecified: Secondary | ICD-10-CM | POA: Diagnosis not present

## 2023-05-31 DIAGNOSIS — G629 Polyneuropathy, unspecified: Secondary | ICD-10-CM | POA: Diagnosis not present

## 2023-05-31 DIAGNOSIS — M79672 Pain in left foot: Secondary | ICD-10-CM | POA: Diagnosis not present

## 2023-05-31 DIAGNOSIS — R5383 Other fatigue: Secondary | ICD-10-CM | POA: Diagnosis not present

## 2023-05-31 DIAGNOSIS — M79642 Pain in left hand: Secondary | ICD-10-CM | POA: Diagnosis not present

## 2023-05-31 DIAGNOSIS — M791 Myalgia, unspecified site: Secondary | ICD-10-CM | POA: Diagnosis not present

## 2023-05-31 DIAGNOSIS — M25572 Pain in left ankle and joints of left foot: Secondary | ICD-10-CM | POA: Diagnosis not present

## 2023-05-31 DIAGNOSIS — M79671 Pain in right foot: Secondary | ICD-10-CM | POA: Diagnosis not present

## 2023-05-31 DIAGNOSIS — M79641 Pain in right hand: Secondary | ICD-10-CM | POA: Diagnosis not present

## 2023-05-31 DIAGNOSIS — M359 Systemic involvement of connective tissue, unspecified: Secondary | ICD-10-CM | POA: Diagnosis not present

## 2023-05-31 DIAGNOSIS — M25571 Pain in right ankle and joints of right foot: Secondary | ICD-10-CM | POA: Diagnosis not present

## 2023-06-03 DIAGNOSIS — M62830 Muscle spasm of back: Secondary | ICD-10-CM | POA: Diagnosis not present

## 2023-06-03 DIAGNOSIS — M5441 Lumbago with sciatica, right side: Secondary | ICD-10-CM | POA: Diagnosis not present

## 2023-06-12 DIAGNOSIS — K219 Gastro-esophageal reflux disease without esophagitis: Secondary | ICD-10-CM | POA: Diagnosis not present

## 2023-06-12 DIAGNOSIS — M2550 Pain in unspecified joint: Secondary | ICD-10-CM | POA: Diagnosis not present

## 2023-06-12 DIAGNOSIS — K5909 Other constipation: Secondary | ICD-10-CM | POA: Diagnosis not present

## 2023-06-12 DIAGNOSIS — M792 Neuralgia and neuritis, unspecified: Secondary | ICD-10-CM | POA: Diagnosis not present

## 2023-06-14 ENCOUNTER — Ambulatory Visit: Payer: BC Managed Care – PPO | Admitting: Family Medicine

## 2023-06-24 DIAGNOSIS — R350 Frequency of micturition: Secondary | ICD-10-CM | POA: Diagnosis not present

## 2023-06-24 DIAGNOSIS — Z20822 Contact with and (suspected) exposure to covid-19: Secondary | ICD-10-CM | POA: Diagnosis not present

## 2023-06-24 DIAGNOSIS — R5383 Other fatigue: Secondary | ICD-10-CM | POA: Diagnosis not present

## 2023-06-25 NOTE — Progress Notes (Signed)
 06/28/23 ALL: Misty Ross returns for Botox . First procedure 12/2022. She reports significant improvement in headaches after first procedure. She was doing great. Missed procedure 02/2023 but reports migraines remained well managed until the past 2-3 weeks. She feels weather changes have triggered more headaches. She tolerated last procedure well. She continues Ajovy  and rizatriptan .   12/21/2022 ALL: Misty Ross presents for first Botox  procedure. She continues Ajovy  and rizatriptan . She has 15 headache days with 8-9 migraines each month on current therapy.     Consent Form Botulism Toxin Injection For Chronic Migraine    Reviewed orally with patient, additionally signature is on file:  Botulism toxin has been approved by the Federal drug administration for treatment of chronic migraine. Botulism toxin does not cure chronic migraine and it may not be effective in some patients.  The administration of botulism toxin is accomplished by injecting a small amount of toxin into the muscles of the neck and head. Dosage must be titrated for each individual. Any benefits resulting from botulism toxin tend to wear off after 3 months with a repeat injection required if benefit is to be maintained. Injections are usually done every 3-4 months with maximum effect peak achieved by about 2 or 3 weeks. Botulism toxin is expensive and you should be sure of what costs you will incur resulting from the injection.  The side effects of botulism toxin use for chronic migraine may include:   -Transient, and usually mild, facial weakness with facial injections  -Transient, and usually mild, head or neck weakness with head/neck injections  -Reduction or loss of forehead facial animation due to forehead muscle weakness  -Eyelid drooping  -Dry eye  -Pain at the site of injection or bruising at the site of injection  -Double vision  -Potential unknown long term risks   Contraindications: You should not have Botox  if you  are pregnant, nursing, allergic to albumin, have an infection, skin condition, or muscle weakness at the site of the injection, or have myasthenia gravis, Lambert-Eaton syndrome, or ALS.  It is also possible that as with any injection, there may be an allergic reaction or no effect from the medication. Reduced effectiveness after repeated injections is sometimes seen and rarely infection at the injection site may occur. All care will be taken to prevent these side effects. If therapy is given over a long time, atrophy and wasting in the muscle injected may occur. Occasionally the patient's become refractory to treatment because they develop antibodies to the toxin. In this event, therapy needs to be modified.  I have read the above information and consent to the administration of botulism toxin.    BOTOX  PROCEDURE NOTE FOR MIGRAINE HEADACHE  Contraindications and precautions discussed with patient(above). Aseptic procedure was observed and patient tolerated procedure. Procedure performed by Greig Forbes, FNP-C.   The condition has existed for more than 6 months, and pt does not have a diagnosis of ALS, Myasthenia Gravis or Lambert-Eaton Syndrome.  Risks and benefits of injections discussed and pt agrees to proceed with the procedure.  Written consent obtained  These injections are medically necessary. Pt  receives good benefits from these injections. These injections do not cause sedations or hallucinations which the oral therapies may cause.   Description of procedure:  The patient was placed in a sitting position. The standard protocol was used for Botox  as follows, with 5 units of Botox  injected at each site:  -Procerus muscle, midline injection  -Corrugator muscle, bilateral injection  -Frontalis muscle, bilateral injection,  with 2 sites each side, medial injection was performed in the upper one third of the frontalis muscle, in the region vertical from the medial inferior edge of the  superior orbital rim. The lateral injection was again in the upper one third of the forehead vertically above the lateral limbus of the cornea, 1.5 cm lateral to the medial injection site.  -Temporalis muscle injection, 4 sites, bilaterally. The first injection was 3 cm above the tragus of the ear, second injection site was 1.5 cm to 3 cm up from the first injection site in line with the tragus of the ear. The third injection site was 1.5-3 cm forward between the first 2 injection sites. The fourth injection site was 1.5 cm posterior to the second injection site. 5th site laterally in the temporalis  muscleat the level of the outer canthus.  -Occipitalis muscle injection, 3 sites, bilaterally. The first injection was done one half way between the occipital protuberance and the tip of the mastoid process behind the ear. The second injection site was done lateral and superior to the first, 1 fingerbreadth from the first injection. The third injection site was 1 fingerbreadth superiorly and medially from the first injection site.  -Cervical paraspinal muscle injection, 2 sites, bilaterally. The first injection site was 1 cm from the midline of the cervical spine, 3 cm inferior to the lower border of the occipital protuberance. The second injection site was 1.5 cm superiorly and laterally to the first injection site.  -Trapezius muscle injection was performed at 3 sites, bilaterally. The first injection site was in the upper trapezius muscle halfway between the inflection point of the neck, and the acromion. The second injection site was one half way between the acromion and the first injection site. The third injection was done between the first injection site and the inflection point of the neck.   Will return for repeat injection in 3 months.   A total of 200 units of Botox  was prepared, 155 units of Botox  was injected as documented above, any Botox  not injected was wasted. The patient tolerated the  procedure well, there were no complications of the above procedure.

## 2023-06-28 ENCOUNTER — Ambulatory Visit: Payer: BC Managed Care – PPO | Admitting: Family Medicine

## 2023-06-28 ENCOUNTER — Encounter: Payer: Self-pay | Admitting: Family Medicine

## 2023-06-28 VITALS — BP 134/77 | HR 84

## 2023-06-28 DIAGNOSIS — G43711 Chronic migraine without aura, intractable, with status migrainosus: Secondary | ICD-10-CM

## 2023-06-28 DIAGNOSIS — G43E19 Chronic migraine with aura, intractable, without status migrainosus: Secondary | ICD-10-CM

## 2023-06-28 MED ORDER — RIZATRIPTAN BENZOATE 10 MG PO TBDP: 10.0000 mg | ORAL_TABLET | ORAL | 11 refills | Status: DC | PRN

## 2023-06-28 MED ORDER — ONABOTULINUMTOXINA 200 UNITS IJ SOLR
155.0000 [IU] | Freq: Once | INTRAMUSCULAR | Status: AC
  Administered 2023-06-28: 155 [IU] via INTRAMUSCULAR

## 2023-06-28 MED ORDER — AJOVY 225 MG/1.5ML ~~LOC~~ SOAJ
225.0000 mg | SUBCUTANEOUS | 3 refills | Status: DC
Start: 2023-06-28 — End: 2024-01-02

## 2023-06-28 NOTE — Progress Notes (Signed)
 Botox - 200 units x 1 vial Lot: [id number] Expiration: 08/2025 NDC: 3818-2993-71   Bacteriostatic 0.9% Sodium Chloride - 4mL total [id number] Expiration: 08/21/23 NDC: 0175-1025-85   Dx: I77.E19 BB   Witnessed by: Clifton Dames

## 2023-07-03 DIAGNOSIS — M2550 Pain in unspecified joint: Secondary | ICD-10-CM | POA: Diagnosis not present

## 2023-07-03 DIAGNOSIS — F411 Generalized anxiety disorder: Secondary | ICD-10-CM | POA: Diagnosis not present

## 2023-07-03 DIAGNOSIS — G4710 Hypersomnia, unspecified: Secondary | ICD-10-CM | POA: Diagnosis not present

## 2023-07-03 DIAGNOSIS — M5430 Sciatica, unspecified side: Secondary | ICD-10-CM | POA: Diagnosis not present

## 2023-07-10 ENCOUNTER — Telehealth: Payer: Self-pay | Admitting: Family Medicine

## 2023-07-10 DIAGNOSIS — F411 Generalized anxiety disorder: Secondary | ICD-10-CM | POA: Diagnosis not present

## 2023-07-10 DIAGNOSIS — F519 Sleep disorder not due to a substance or known physiological condition, unspecified: Secondary | ICD-10-CM | POA: Diagnosis not present

## 2023-07-10 DIAGNOSIS — F331 Major depressive disorder, recurrent, moderate: Secondary | ICD-10-CM | POA: Diagnosis not present

## 2023-07-10 DIAGNOSIS — Z5181 Encounter for therapeutic drug level monitoring: Secondary | ICD-10-CM | POA: Diagnosis not present

## 2023-07-10 DIAGNOSIS — F902 Attention-deficit hyperactivity disorder, combined type: Secondary | ICD-10-CM | POA: Diagnosis not present

## 2023-07-10 MED ORDER — AJOVY 225 MG/1.5ML ~~LOC~~ SOAJ: 225.0000 mg | SUBCUTANEOUS | 0 refills | Status: DC

## 2023-07-10 NOTE — Addendum Note (Signed)
 Addended by: Raynald Kemp A on: 07/10/2023 03:00 PM   Modules accepted: Orders

## 2023-07-10 NOTE — Telephone Encounter (Addendum)
 Spoke to patient states wasted half of Ajovy medication . Pt states didn't get full dose . Per Amy,NP ok to give 1 sample . Placed sample in refrigerator Pt will pick up this afternoon

## 2023-07-10 NOTE — Telephone Encounter (Signed)
 At 12:19 Pt left a vm stating she by accident wasted her Fremanezumab-vfrm (AJOVY) 225 MG/1.5ML SOAJ .  She is asking for a call to discuss with RN

## 2023-07-18 DIAGNOSIS — R5383 Other fatigue: Secondary | ICD-10-CM | POA: Diagnosis not present

## 2023-07-18 DIAGNOSIS — G629 Polyneuropathy, unspecified: Secondary | ICD-10-CM | POA: Diagnosis not present

## 2023-07-18 DIAGNOSIS — M797 Fibromyalgia: Secondary | ICD-10-CM | POA: Diagnosis not present

## 2023-07-18 DIAGNOSIS — M359 Systemic involvement of connective tissue, unspecified: Secondary | ICD-10-CM | POA: Diagnosis not present

## 2023-07-24 ENCOUNTER — Encounter: Payer: Self-pay | Admitting: Neurology

## 2023-07-24 ENCOUNTER — Ambulatory Visit: Payer: BC Managed Care – PPO | Admitting: Neurology

## 2023-07-24 VITALS — BP 130/90 | HR 67 | Ht 59.0 in | Wt 151.0 lb

## 2023-07-24 DIAGNOSIS — G5603 Carpal tunnel syndrome, bilateral upper limbs: Secondary | ICD-10-CM | POA: Diagnosis not present

## 2023-07-24 DIAGNOSIS — M7918 Myalgia, other site: Secondary | ICD-10-CM | POA: Diagnosis not present

## 2023-07-24 DIAGNOSIS — M5431 Sciatica, right side: Secondary | ICD-10-CM

## 2023-07-24 NOTE — Progress Notes (Signed)
 [facility]    Provider:  Dr Lucia Gaskins Requesting Provider: Carilyn Goodpasture, NP Primary Care Provider:  Carilyn Goodpasture, NP  CC:  migraines, hand numbness, sciatica  07/24/2023: She went to rheumatology and was dxed with fibromyalgia but when she sleeps she can;t sleep on either side but her hands fall asleepp. Mostly thimb and forefiner, in the mornings her hands are locked. She has sciatica, that started 8 months ago, she woke up with low back pain, on the right, shoots to the back of the knee, comes and goes, no triggers, just on the right side. She has had steroids a few times that have helped, been under the care of multiple physicians including rheumatology and primary care and she has tried conservative measures, OTC meds and stretching and yoga and going to the gym and stretching. + weakness right leg. No other focal neurologic deficits, associated symptoms, inciting events or modifiable factors.   Patient complains of symptoms per HPI as well as the following symptoms: none . Pertinent negatives and positives per HPI. All others negative   01/09/2022: Follow up for migraines and fatigue. Narcolepsy panel negative. Ajovy approved. Sleep team consulted for fatigue. The Ajovy shot has been fantastic. She is still getting a few headaches but overall everything is excellent. When she does get a migraine at lest 50% improved and frequency is > 50% improved. Nurtec worked great, had samples, discussed acute management. Has 6 migraine days a month now and < 10 total headache days a month so will prescribe Nurtec. Also only been on Ajovy for 3 months, maximum efficacy in 6 months so will continue to get better. No other focal neurologic deficits, associated symptoms, inciting events or modifiable factors. Answered questions, discussed acute medications options, had samples of nurtec and ubrelvy and nurtec worked better.  Patient complains of symptoms per HPI as well as the following  symptoms: migraines . Pertinent negatives and positives per HPI. All others negative   HPI 10/04/2021:  Misty Ross is a 33 y.o. female here as requested by Carilyn Goodpasture, NP for intractable migraines.  History of overactive bladder, drug abuse, panic attacks, tobacco use, anxiety, essential hypertension, excessive sleepiness, intractable migraine with aura without status migrainosus, anxiety, panic attacks, has a psychiatrist and therapist.  I reviewed Sabino Gasser notes which states patient is having dizziness, fatigue, tinnitus and migraines, she also has a history of significant anxiety, on her last appointment on September 08, 2021 they discussed the dizziness and migraines, her lab work in February 2023 were unremarkable, no evidence anemia, electrolyte imbalance, infection or thyroid disorders.  She was seen in [place] by neurologist.  She was previously giving samples by neurologist for prophylactic medication but never went back, she recently quit her job because of significant fatigue and excessive sleepiness.  She feels that she can sleep all the time, migraines lasting for days, she has daily migraines and they are constant and her vision is blurry, she does not take any medication for her migraines, she was last told by her neurologist not to take daily medications (I suspect this was analgesics and not prevention) she will periodically take over-the-counter medications and states this helps much, her last eye exam was 5 years ago and she sporadically wears glasses, she is in bed "all of the time and unable to work" per her mother, she requested temporary disability from work and mother is overwhelmed because he is paying on the bills for the daughter, she also reports joint stiffness.  She  also has a psychiatrist and has seen them, she is taking half a Klonopin in the evenings and also think she has fibromyalgia I reviewed general examination which was normal and neurologic examination which was  normal.  As an aside patient's "passing out" is laying in bed and falling asleep without realizing it, primary care did not think that this warranted aggressive evaluation at this time could be caused by medication she is taking in regular sleep schedule such as Klonopin and trazodone.  Per patient: Started having migraines around the age of 52. Migraines start behind her left eye, no precipitating events, occurring daily, worsening, start behind the left eye and migrate to the left temporal lobe, can occur on the right side of her head as well, persistent blurry vision of her left eye regardless of headaches(better with glasses, but she does get blurry vision with the headaches/migraines, states she saw an optometrist and had an exam, she reports severe headaches every day with photophobia, phonophobia and nausea vomiting, excessive fatigue,  hurts to move, sitting in a dark and pillow over eyes, and mood impairment because of headaches and not being able to work.  Migraines can be 10 out of 10 in pain and they are at least moderate to severe. They can last for days, weather affects her. She stopped taking fioricet and goodys. HUrts to move. Also has vertigo, she gets dizzy with and without migraines, worse when bending over,has morning headaches. Bernita Raisin and nurtec works well. No other focal neurologic deficits, associated symptoms, inciting events or modifiable factors. Father is with her and provides much information as well.  Sleeping a lot, she can sleep for days and awake for a few hours just to eat and then goes back to bed, unknown if snoring, excessive daytime somnolence, can fall asleep in a lobby, she worries about driving too long and falling asleep, worsening, ongoing and worsening since February he sleeps excessive amounts.   Reviewed notes, labs and imaging from outside physicians, which showed: I reviewed a note from neurology Dr. Rosalyn Gess, frequent headaches caused her to lose her job, but 2  years ago she started getting headaches and found out that she had hypertension, she was started on medication for blood pressure which helped the headaches for a while but over the last 2 months she has had a headache every day (this is from an appointment in February 2021) PCP tried Fioricet, she reported at this appointment frequent headaches bilateral temples or behind eyes, primarily left side most severe patient describes as a band around her head, throbbing patient's most severe headaches and given a pain score of 10 out of 10, headaches are associated with phonophobia, photophobia, nausea, vomiting, presence of aura.  Migraines start behind her left eye, no precipitating events, occurring daily, start behind the left eye and migrate to the left temporal lobe, can occur on the right side of her head as well, persistent blurry vision of her left eye regardless of headaches, states she saw an optometrist for this who said she had "scarring" of her eye from high blood pressure, she reports severe headaches every day with photophobia, phonophobia and nausea vomiting, excessive fatigue, and mood impairment because of headaches and not being able to work.  Migraines can be 10 out of 10 in pain and they are at least moderate to severe.  Taking Lopressor for elevated blood pressure which is also a migraine medication.  She often wakes up with a pounding sensation in her chest, she does  not check her blood pressure at home, at the time she was taking Fioricet every 4 hours and she had tried over-the-counter NSAIDs with no relief and Dr. Rosalyn Gess did discuss medication overuse headache with Fioricet and patient has stopped taking that apparently.  They tried Topamax and a prednisone taper to help while she decreases Fioricet.  MRI of the brain was normal.  A referral was placed to cardiology for hypertension work-up and complaining of chest pain.  She was started on topiramate and given Ubrelvy and Nurtec samples and  a Medrol Dosepak.  It appears she went twice last appointment July 24, 2019 and did not return.  Taken by primary care include thyroid, CBC with differential, CMP, B12, vitamin D, ESR, ANA, CRP, CPK: Drawn in February 2023, TSH was normal, vitamin D was low at 23.8, CMP was unremarkable with BUN 19 and creatinine 0.97, CBC was unremarkable, B12 425, sed rate normal 20, CRP 5 normal, CK 57 normal, ANA IFA negative.  From a thorough review of records, medications tried that can be used in migraine management include metoprolol(>6 months), trazodone, Lamictal, escitalopram oxylate, Fioricet, Goody powder, Excedrin, ibuprofen, samples of Ubrelvy and Nurtec, nortriptyline(side effects), topiramate (> 6 months did not work), sumatriptan, rizatriptan  I reviewed MRI of the brain order date 07/18/2019 I have reports no images but notes say MRI negative for any acute or abnormal findings, result normal.  Referring doctor was Irine Seal.  Dr. 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 Pertinent negatives and positives per HPI. All others negative.   Social History   Socioeconomic History   Marital status: Single    Spouse name: Not on file   Number of children: Not on file   Years of education: Not on file   Highest education level: Not on file  Occupational History   Not on file  Tobacco Use   Smoking status: Former    Current packs/day: 0.50    Types: Cigarettes   Smokeless tobacco: Never  Vaping Use   Vaping status: Every Day   Substances: Nicotine  Substance and Sexual Activity   Alcohol use: Not Currently    Comment: one beer per day   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   Sexual  activity: Not on file  Other Topics Concern   Not on file  Social History Narrative   Right handed   Caffeine: 1-2 cups per day    Lives with her family   Social Drivers of Corporate investment banker Strain: Not on file  Food Insecurity: Not on file  Transportation Needs: Not on file  Physical Activity: Not on file  Stress: Not on file  Social Connections: Not on file  Intimate Partner Violence: Not At Risk (03/31/2023)   Received from [facility]   HITS    Over the last 12 months how often did your partner physically hurt you?: Never    Over the last 12 months how often did your partner insult you or talk down to you?: Never    Over the last 12 months how often did your partner threaten you with physical harm?: Never    Over the last 12 months how often did your partner scream or  curse at you?: Never    Family History  Problem Relation Age of Onset   Hyperlipidemia Father    Migraines Father    Multiple sclerosis Brother    Cancer Maternal Grandmother    Diabetes Maternal Grandmother    Cancer Maternal Grandfather    Heart disease Paternal Grandmother    Cancer Paternal Grandfather     Past Medical History:  Diagnosis Date   ADHD (attention deficit hyperactivity disorder)    Allergy    Anxiety    Depression    Eczema    Fibromyalgia    Hypertension    Migraine    Seizures (HCC)     Patient Active Problem List   Diagnosis Date Noted   Migraine without aura and without status migrainosus, not intractable 01/09/2022   Depression, major, recurrent (HCC) 07/17/2014   Suicide ideation 07/17/2014   Polysubstance dependence (HCC) 07/17/2014   Major depressive disorder, recurrent, severe without psychotic features (HCC)    Aspiration pneumonia (HCC) 05/24/2014   Acute encephalopathy 05/23/2014   Suicidal ideation 05/23/2014   Polysubstance abuse (HCC) 05/23/2014   Acute respiratory failure with hypoxemia (HCC) 05/23/2014   Suicide attempt (HCC) 05/23/2014    Drug overdose, intentional (HCC)    Tobacco use disorder 03/28/2007   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     diclofenac (VOLTAREN) 75 MG EC tablet Take 75 mg by mouth 2 (two) times daily as needed.     escitalopram (LEXAPRO) 20 MG tablet Take 20 mg by mouth daily.     Fremanezumab-vfrm (AJOVY) 225 MG/1.5ML SOAJ Inject 225 mg into the skin every 30 (thirty) days. 3 mL 3   Fremanezumab-vfrm (AJOVY) 225 MG/1.5ML SOAJ Inject 225 mg into the skin every 30 (thirty) days. 1.5 mL 0   gabapentin (NEURONTIN) 100 MG capsule Take 100 mg by mouth 3 (three) times daily.     lamoTRIgine (LAMICTAL) 200 MG tablet Take 200 mg by mouth daily.     metoprolol tartrate (LOPRESSOR) 50 MG tablet Take 75 mg by mouth 2 (two) times daily.     norethindrone (MICRONOR) 0.35 MG tablet Take 1 tablet by mouth daily.     ondansetron (ZOFRAN-ODT) 4 MG disintegrating tablet TAKE 1-2 TABLETS (4-8 MG TOTAL) BY MOUTH EVERY 8 (EIGHT) HOURS AS NEEDED. GREAT FOR NAUSEA OR MOTION SICKNESS. CAN ALSO TAKE IT WITH RIZATRIPTAN OR NURTEC FOR MIGRAINES 30 tablet 3   pantoprazole (PROTONIX) 40 MG tablet Take 40 mg by mouth every morning.     rizatriptan (MAXALT-MLT) 10 MG disintegrating tablet Take 1 tablet (10 mg total) by mouth as needed for migraine. May repeat in 2 hours if needed. Take right at onset of migraine 9 tablet 11   solifenacin (VESICARE) 5 MG tablet Take 5 mg by mouth daily.     Vitamin D, Ergocalciferol, (DRISDOL) 1.25 MG (50000 UNIT) CAPS capsule Take 50,000 Units by mouth once a week.     Rimegepant Sulfate (NURTEC) 75 MG TBDP Take 1 tablet (75 mg total) by mouth daily as needed.  For migraines. Take as close to onset of migraine as possible. One daily maximum. (Patient not taking: Reported on 07/24/2023) 16 tablet 11  No current facility-administered medications for this visit.    Allergies as of 07/24/2023 - Review Complete 07/24/2023  Allergen Reaction Noted   Benadryl [diphenhydramine]  02/28/2022    Vitals: BP (!) 130/90 (BP Location: Left Arm, Patient Position: Sitting, Cuff Size: Normal)   Pulse 67   Ht 4\' 11"  (1.499 m)   Wt 151 lb (68.5 kg)   BMI 30.50 kg/m  Last Weight:  Wt Readings from Last 1 Encounters:  07/24/23 151 lb (68.5 kg)   Last Height:   Ht Readings from Last 1 Encounters:  07/24/23 4\' 11"  (1.499 m)     Physical exam: Exam: NAD, pleasant                  Speech:    Speech is normal; fluent and spontaneous with normal comprehension.  Cognition:    The patient is oriented to person, place, and time;     recent and remote memory intact;     language fluent;    Cranial Nerves:    The pupils are equal, round, and reactive to light.Trigeminal sensation is intact and the muscles of mastication are normal. The face is symmetric. The palate elevates in the midline. Hearing intact. Voice is normal. Shoulder shrug is normal. The tongue has normal motion without fasciculations.   Coordination:  No dysmetria  Motor Observation:    No asymmetry, no atrophy, and no involuntary movements noted. Tone:    Normal muscle tone.     Strength:right leg flexion weakness 3+/5, otherwise strength is V/V in the upper and lower limbs.      Sensation: intact to LT     +mcphalen's sign Right leg flexion weakness  Assessment/Plan:  Patient with intractable chronic migraines and excessive fatigue. Here today however for hand numbness and right sciatica  +mcphalen's maneuver. Numbness in median distribution especially at night and in the mornings. Wear wrist braces and can come back for emg/ncs 9schedule 4/17) if still symptomatic Sciatica:  Physical Therapy: Lumbar myofascial pain, forward posture contributing to migraines and cervicalgia. Please evaluate and treat including dry needling, stretching, strengthening, manual therapy/massage, heating, TENS unit, exercising for scapular stabilization, pectoral stretching and rhomboid strengthening as clinically warranted as well as any other modality as recommended by evaluation. If PT does not help, can order MRI lumbar spine: She has had steroids a few times that have helped, been under the care of multiple physicians including rheumatology and primary care and she has tried conservative measures, OTC meds and stretching and yoga and going to the gym and stretching. + weakness right leg on exam in sciatic distribution (leg flexion)  Orders Placed This Encounter  Procedures   Ambulatory referral to Physical Therapy   NCV with EMG(electromyography)   PRIOR ASSESSMENTS AND PLAN FOR MIGRAINES - The Ajovy shot has been fantastic. She is still getting a few headaches but overall everything is excellent. When she does get a migraine at least 50% improved and frequency is > 50% improved. Nurtec worked great, had samples, discussed acute management. Has 6 migraine days a month now and < 10 total headache days a month so will prescribe Nurtec. Also only been on Ajovy for 3 months, maximum efficacy in 6 months so will continue to get better -Remainder of the headaches can take nurtec as needed when you have a headache there is also an indication for every other day use fyi -Can also try Rizatriptan acutely again: Please take one tablet at the onset of your headache. If it  does not improve the symptoms please take one additional tablet. Do not take more then 2 tablets in 24hrs. Do not take use more then 2 to 3 times in a week. -Ondansetron: Great for nausea or motion sickness. Can also take it with Rizatriptan or nurtec - Continue the Ajovy for prevention once monthly   Sleep evaluation: Narcolepsy  panel negative. Ajovy approved. Sleep team consulted for fatigue. Sleeping a lot, she can sleep for days and awake for a few hours just to eat and then goes back to bed, unknown if snoring, excessive daytime somnolence, can fall asleep in a lobby, she worries about driving too long and falling asleep, worsening, ongoing for years since before 2021, and worsening since February she sleeps excessive amounts for days on end. She wakes up and her arms are frozen, very vivid dreams, she has not lost tone but she has sleep paralysis and used to be scary for her.   From a thorough review of records, medications tried that can be used in migraine management include metoprolol(>6 months), trazodone, Lamictal, escitalopram oxylate, Fioricet, Goody powder, Excedrin, ibuprofen, samples of Ubrelvy and Nurtec, nortriptyline(side effects), topiramate (> 6 months did not work), sumatriptan, rizatriptan  Discussed: To prevent or relieve headaches, try the following: Cool Compress. Lie down and place a cool compress on your head.  Avoid headache triggers. If certain foods or odors seem to have triggered your migraines in the past, avoid them. A headache diary might help you identify triggers.  Include physical activity in your daily routine. Try a daily walk or other moderate aerobic exercise.  Manage stress. Find healthy ways to cope with the stressors, such as delegating tasks on your to-do list.  Practice relaxation techniques. Try deep breathing, yoga, massage and visualization.  Eat regularly. Eating regularly scheduled meals and maintaining a healthy diet might help prevent headaches. Also, drink plenty of fluids.  Follow a regular sleep schedule. Sleep deprivation might contribute to headaches Consider biofeedback. With this mind-body technique, you learn to control certain bodily functions -- such as muscle tension, heart rate and blood pressure -- to prevent headaches or reduce headache pain.    Proceed to  emergency room if you experience new or worsening symptoms or symptoms do not resolve, if you have new neurologic symptoms or if headache is severe, or for any concerning symptom.   Cc: Carilyn Goodpasture, NP,  Carilyn Goodpasture, NP  Naomie Dean, MD  [facility] [address] [place], [place] [address]  Phone [phone] Fax [phone]  I spent over 30 minutes of face-to-face and non-face-to-face time with patient on the  1. Bilateral carpal tunnel syndrome   2. 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  This included previsit chart review, lab review, study review, order entry, electronic health record documentation, patient education on the different diagnostic and therapeutic options, counseling and coordination of care, risks and benefits of management, compliance, or risk factor reduction

## 2023-07-24 NOTE — Patient Instructions (Addendum)
 Carpal Tunnel Syndrome Send to physical therapy - [facility]   Pinched Nerve in the Wrist (Carpal Tunnel Syndrome): What to Know  Pinched nerve in the wrist (carpal tunnel syndrome, or CTS) is a nerve problem that causes pain, numbness, and weakness in the wrist, hand, and fingers. The carpal tunnel is a narrow space that is on the palm side of your wrist. Repeated wrist motions or certain diseases may cause swelling in the tunnel. This swelling can pinch the main nerve in the wrist (the median nerve). What are the causes? CTS may be caused by: Moving your hand and wrist over and over again while doing a task. Hurting the wrist. Arthritis. A pocket of fluid (cyst) or a growth (tumor) in the carpal tunnel. Fluid buildup when you are pregnant. Use of tools that vibrate. In some cases, the cause of CTS is not known. What increases the risk? You're more likely to have CTS if: You have a job that makes you do these things: Move your hand firmly over and over again. Work with tools that vibrate, such as drills or sanders. You're female. You have diabetes, obesity, thyroid problems, or kidney failure. What are the signs or symptoms? Symptoms of this condition include: A tingling feeling in your fingers. You may feel this pain in the thumb, index finger, or middle finger. Tingling or loss of feeling in your hand. Pain in your entire arm. This pain may get worse when you bend your wrist and elbow for a long time. Pain in your wrist that goes up your arm to your shoulder. Pain that goes down into your palm or fingers. Weakness in your hands. You may find it hard to grab and hold items. Your symptoms may feel worse during the night. How is this diagnosed? CTS is diagnosed with a medical history and physical exam. Tests and imaging may also be done to: Check the electrical signals sent by your nerves into the muscles. Check how well electrical signals pass through your nerves. Check possible  causes of your CTS. These include X-rays, ultrasound, and MRI. How is this treated? CTS may be treated with: Lifestyle changes. You will be asked to stop or change the activity that caused your problem. Physical therapy. This may include: Exercises that stretch and strengthen the muscles and tendons in the wrist and hand. Nerve gliding or flossing exercises. These help keep nerves moving smoothly through the tissues around them. Occupational therapy. You'll learn how to use your hand again. Medicines for pain and swelling. You may have injections in your wrist. A wrist splint or brace. Surgery. Follow these instructions at home: If you have a splint or brace: Wear the splint or brace as told. Take it off only if your provider says you can. Check the skin around it every day. Tell your provider if you see problems. Loosen the splint or brace if your fingers tingle, are numb, or turn cold and blue. Keep the splint or brace clean and dry. If the splint or brace isn't waterproof: Do not let it get wet. Cover it when you take a bath or shower. Use a cover that doesn't let any water in. Managing pain, stiffness, and swelling  Use ice or an ice pack as told. If you have a splint or brace that you can take off, remove it only as told. Place a towel between your skin and the ice. Leave the ice on for 20 minutes, 2-3 times a day. If your skin turns red, take  off the ice right away to prevent skin damage. The risk of damage is higher if you can't feel pain, heat, or cold. Move your fingers often to reduce stiffness and swelling. General instructions Take your medicines only as told. Rest your wrist and hand from activity that may cause pain. If your CTS is caused by things you do at work, talk with your employer about making changes. For example, you may need a wrist pad to use while typing. Exercise as told. Follow instructions on how to do nerve gliding or flossing exercises. These help keep  nerves in moving smoothly through the tissues around them. Keep all follow-up visits. This is important. Where to find more information American Academy of Orthopedic Surgeons: orthoinfo.aaos.[employer] of Neurological Disorders and Stroke: BasicFM.no Contact a health care provider if: You have new symptoms. Your pain is not controlled with medicines. Your symptoms get worse. Get help right away if: Your hand or wrist tingles or is numb, and the symptoms become very bad. This information is not intended to replace advice given to you by your health care provider. Make sure you discuss any questions you have with your health care provider. Document Revised: 03/20/2023 Document Reviewed: 01/05/2023 Elsevier Patient Education  2024 [employer] Inc.  Electromyoneurogram Electromyoneurogram is a test to check how well your muscles and nerves are working. This procedure includes the combined use of electromyogram (EMG) and nerve conduction study (NCS). EMG is used to evaluate muscles and the nerves that control those muscles. NCS, which is also called electroneurogram, measures how well your nerves conduct electricity. The procedures should be done together to check if your muscles and nerves are healthy. If the results of the tests are abnormal, this may indicate disease or injury, such as a neuromuscular disease or peripheral nerve damage. Tell a health care provider about: Any allergies you have. All medicines you are taking, including vitamins, herbs, eye drops, creams, and over-the-counter medicines. Any bleeding problems you have. Any surgeries you have had. Any medical conditions you have. What are the risks? Generally, this is a safe procedure. However, problems may occur, including: Bleeding or bruising. Infection where the electrodes were inserted. What happens before the test? Medicines Take all of your usually prescribed medications before this testing is performed.  Do not stop your blood thinners unless advised by your prescribing physician. General instructions Your health care provider may ask you to warm the limb that will be checked with warm water, hot pack, or wrapping the limb in a blanket. Do not use lotions or creams on the same day that you will be having the procedure. What happens during the test? For EMG  Your health care provider will ask you to stay in a position so that the muscle being studied can be accessed. You will be sitting or lying down. You may be given a medicine to numb the area (local anesthetic) and the skin will be disinfected. A very thin needle that has an electrode will be inserted into your muscle, one muscle at a time. Typically, multiple muscles are evaluated during a single study. Another small electrode will be placed on your skin near the muscle. Your health care provider will ask you to continue to remain still. The electrodes will record the electrical activity of your muscles. You may see this on a monitor or hear it in the room. After your muscles have been studied at rest, your health care provider will ask you to contract or flex your muscles.  The electrodes will record the electrical activity of your muscles. Your health care provider will remove the electrodes and the electrode needle when the procedure is finished. The procedure may vary among health care providers and hospitals. For NCS  An electrode that records your nerve activity (recording electrode) will be placed on your skin by the muscle that is being studied. An electrode that is used as a reference (reference electrode) will be placed near the recording electrode. A paste or gel will be applied to your skin between the recording electrode and the reference electrode. Your nerve will be stimulated with a mild shock. The speed of the nerves and strength of response is recorded by the electrodes. Your health care provider will remove the electrodes  and the gel when the procedure is finished. The procedure may vary among health care providers and hospitals. What can I expect after the test? It is up to you to get your test results. Ask your health care provider, or the department that is doing the test, when your results will be ready. Your health care provider may: Give you medicines for any pain. Monitor the insertion sites to make sure that bleeding stops. You should be able to drive yourself to and from the test. Discomfort can persist for a few hours after the test, but should be better the next day. Contact a health care provider if: You have swelling, redness, or drainage at any of the insertion sites. Summary Electromyoneurogram is a test to check how well your muscles and nerves are working. If the results of the tests are abnormal, this may indicate disease or injury. This is a safe procedure. However, problems may occur, such as bleeding and infection. Your health care provider will do two tests to complete this procedure. One checks your muscles (EMG) and another checks your nerves (NCS). It is up to you to get your test results. Ask your health care provider, or the department that is doing the test, when your results will be ready. This information is not intended to replace advice given to you by your health care provider. Make sure you discuss any questions you have with your health care provider. Document Revised: 01/19/2021 Document Reviewed: 12/19/2020 Elsevier Patient Education  2024 [employer].

## 2023-07-26 ENCOUNTER — Telehealth: Payer: Self-pay | Admitting: *Deleted

## 2023-07-26 MED ORDER — AJOVY 225 MG/1.5ML ~~LOC~~ SOAJ: 225.0000 mg | SUBCUTANEOUS | Status: DC

## 2023-07-26 NOTE — Telephone Encounter (Signed)
 Pt called.  She was giving herself ajovy injection (some how the needle was flush to cap and did not push fluid out but when went to take lid off fluid came out.  Malfunction.  I told her to call [facility] about replacement.  I did give her a sample AJOVY LOT [id number], exp June 2025.  NDC 40981-191-47. Pt picked up today.

## 2023-07-27 DIAGNOSIS — G43911 Migraine, unspecified, intractable, with status migrainosus: Secondary | ICD-10-CM | POA: Diagnosis not present

## 2023-07-31 ENCOUNTER — Encounter: Payer: Self-pay | Admitting: Physical Therapy

## 2023-07-31 ENCOUNTER — Ambulatory Visit: Attending: Neurology | Admitting: Physical Therapy

## 2023-07-31 ENCOUNTER — Other Ambulatory Visit: Payer: Self-pay

## 2023-07-31 DIAGNOSIS — M5431 Sciatica, right side: Secondary | ICD-10-CM | POA: Insufficient documentation

## 2023-07-31 DIAGNOSIS — M6281 Muscle weakness (generalized): Secondary | ICD-10-CM | POA: Diagnosis not present

## 2023-07-31 DIAGNOSIS — R293 Abnormal posture: Secondary | ICD-10-CM | POA: Diagnosis not present

## 2023-07-31 DIAGNOSIS — R2689 Other abnormalities of gait and mobility: Secondary | ICD-10-CM | POA: Diagnosis not present

## 2023-07-31 DIAGNOSIS — M7918 Myalgia, other site: Secondary | ICD-10-CM | POA: Insufficient documentation

## 2023-07-31 NOTE — Patient Instructions (Signed)

## 2023-07-31 NOTE — Therapy (Signed)
 OUTPATIENT PHYSICAL THERAPY THORACOLUMBAR EVALUATION   Patient Name: Misty Ross MRN: [id number] DOB:01/09/1991, 33 y.o., female Today's Date: 07/31/2023  END OF SESSION:  PT End of Session - 07/31/23 0935     Visit Number 1    Authorization Type BCBS    PT Start Time 0935    PT Stop Time 1015    PT Time Calculation (min) 40 min             Past Medical History:  Diagnosis Date   ADHD (attention deficit hyperactivity disorder)    Allergy    Anxiety    Depression    Eczema    Fibromyalgia    Hypertension    Migraine    Seizures (HCC)    Past Surgical History:  Procedure Laterality Date   ESOPHAGOGASTRODUODENOSCOPY     TONSILLECTOMY AND ADENOIDECTOMY     Patient Active Problem List   Diagnosis Date Noted   Migraine without aura and without status migrainosus, not intractable 01/09/2022   Depression, major, recurrent (HCC) 07/17/2014   Suicide ideation 07/17/2014   Polysubstance dependence (HCC) 07/17/2014   Major depressive disorder, recurrent, severe without psychotic features (HCC)    Aspiration pneumonia (HCC) 05/24/2014   Acute encephalopathy 05/23/2014   Suicidal ideation 05/23/2014   Polysubstance abuse (HCC) 05/23/2014   Acute respiratory failure with hypoxemia (HCC) 05/23/2014   Suicide attempt (HCC) 05/23/2014   Drug overdose, intentional (HCC)    Tobacco use disorder 03/28/2007   ADJ DISORDER W/MIXED DISTURBANCE EMOTION&CONDUCT 03/28/2007   Attention deficit hyperactivity disorder (ADHD) 03/28/2007    PCP: Carilyn Goodpasture, NP  REFERRING PROVIDER:  Anson Fret, MD   REFERRING DIAG:  M54.31 (ICD-10-CM) - Sciatica of right side  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 Pt reports slow onset of pain. Pt was diagnosed with fibromyalgia 2 weeks ago. Pt states her low back hurts now. Notes difficulty sleeping. 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 Some days it will be horrible and some days it will be fine. When it starts it lasts 3-4 days. Some days it's bad  that she walks with a limp. Has not tried heat or ice. Has tried to do some yoga. Has been doing cardio (elliptical).   PERTINENT HISTORY:  ADHD, anxiety, depression, fibromyalgia, migraines, substance use  PAIN:  Are you having pain? Yes: NPRS scale: 2 or 3/10 currently; at worst 8 or 9/10 Pain location: R low back, R posterior hip Pain description: sharp shooting pain down the back of her leg Aggravating factors: Not sure Relieving factors: Not sure  PRECAUTIONS: None  RED FLAGS: None   WEIGHT BEARING RESTRICTIONS: No  FALLS:  Has patient fallen in last 6 months? 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  DIAGNOSTIC FINDINGS:   DG Lumbar Spine Complete Final result 08/09/2022 12:40 PM    Narrative  CLINICAL DATA:  Back pain  EXAM: LUMBAR SPINE - COMPLETE 4+ VIEW  COMPARISON:  None Available.  FINDINGS: There is no evidence of lumbar spine fracture. Alignment is normal. Intervertebral disc spaces are maintained. ...       DG Thoracic Spine W/Swimmers Final result 08/09/2022 12:40 PM    Narrative  CLINICAL DATA:  Thoracic back  pain  EXAM: THORACIC SPINE - 3 VIEWS  COMPARISON:  08/09/2022, 04/02/2013  FINDINGS: There is no evidence of thoracic spine fracture. Alignment is normal. No other significant bone abnormalities are identified. ...       DG Cervical Spine Complete Final result 08/09/2022 12:40 PM    Narrative  CLINICAL DATA:  Neck pain  EXAM: CERVICAL SPINE - COMPLETE 4+ VIEW  COMPARISON:  08/09/2022  FINDINGS: There is no evidence of cervical spine fracture or prevertebral soft tissue swelling. Alignment is normal. No other significant bone ...      PATIENT SURVEYS:  Modified Oswestry Low Back Pain Disability Questionnaire: 16 / 50 = 32.0 %  COGNITION: Overall cognitive status: Within functional limits for tasks assessed     SENSATION: WFL  MUSCLE LENGTH: Hamstrings: Right ~60 deg; Left ~80 deg Thomas test: WNL  POSTURE: increased lumbar lordosis  PALPATION: TTP R QL into upper glute max and glute med. No particular tenderness in piriformis on eval  LUMBAR ROM:   AROM eval  Flexion 100%  Extension 100%*  Right lateral flexion To knee  Left lateral flexion To knee *  Right rotation 100%  Left rotation 100%   (Blank rows = not tested, * = pain)  LOWER EXTREMITY ROM:   WNL  Active  Right eval Left eval  Hip flexion    Hip extension    Hip abduction    Hip adduction    Hip internal rotation    Hip external rotation    Knee flexion    Knee extension    Ankle dorsiflexion    Ankle plantarflexion    Ankle inversion    Ankle eversion     (Blank rows = not tested)  LOWER EXTREMITY MMT:    MMT Right eval Left eval  Hip flexion 5 5  Hip extension 4+ 5  Hip abduction 3+ 5  Hip adduction    Hip internal rotation 3+ 4  Hip external rotation 3+ 4  Knee flexion 4- 5  Knee extension 5 5  Ankle dorsiflexion    Ankle plantarflexion  Ankle inversion    Ankle eversion     (Blank rows = not tested)  LUMBAR SPECIAL TESTS:  Straight leg raise test: Positive,  Slump test: Negative, FABER test: Positive, and Thomas test: Positive  FUNCTIONAL TESTS:  Did not assess  GAIT: Distance walked: Into clinic Assistive device utilized: None Level of assistance: Complete Independence Comments: Normal reciprocal gait pattern  TREATMENT DATE:  07/31/23 See HEP below                                                                                                                                 PATIENT EDUCATION:  Education details: Exam findings, POC, initial HEP, dry needling Person educated: Patient Education method: Explanation, Demonstration, and Handouts Education comprehension: verbalized understanding, returned demonstration, and needs further education  HOME EXERCISE PROGRAM: Access Code: [email or web address] URL: [email or web address] Date: 07/31/2023 Prepared by: Vernon Prey April Kirstie Peri  Exercises - Supine Lower Trunk Rotation  - 1 x daily - 7 x weekly - 3 sets - 10 reps - Supine Piriformis Stretch with Foot on Ground  - 1 x daily - 7 x weekly - 2 sets - 30 sec hold - Standing Quadratus Lumborum Stretch with Doorway (Mirrored)  - 1 x daily - 7 x weekly - 2 sets - 30 sec hold - Clamshell with Resistance  - 1 x daily - 7 x weekly - 2 sets - 10 reps - Sidelying Reverse Clamshell with Resistance  - 1 x daily - 7 x weekly - 2 sets - 10 reps - Side Plank on Knees  - 1 x daily - 7 x weekly - 2 sets - 30 sec hold - Standing Hip Abduction with Resistance at Ankles and Counter Support  - 1 x daily - 7 x weekly - 2 sets - 10 reps - 3 sec hold  Patient Education - Trigger Point Dry Needling  ASSESSMENT:  CLINICAL IMPRESSION: Patient is a 33 y.o. F who was seen today for physical therapy evaluation and treatment for R sciatica. PMH significant for recent diagnosis of fibromyalgia. Assessment is significant for R > L hip weakness, R>L core weakness, and tenderness and tightness in R glute med/max and QL likely causing sciatic nerve irritation in  lower lumbar. No over tightness or tenderness noted in R piriformis. Pt will benefit from PT to address these issues to improve her symptoms for home and work tasks.   OBJECTIVE IMPAIRMENTS: Abnormal gait, decreased activity tolerance, decreased coordination, decreased endurance, decreased mobility, difficulty walking, decreased ROM, decreased strength, increased fascial restrictions, increased muscle spasms, impaired sensation, improper body mechanics, postural dysfunction, and pain.   ACTIVITY LIMITATIONS: carrying, lifting, bending, sitting, standing, sleeping, bed mobility, and locomotion level  PARTICIPATION LIMITATIONS: meal prep, cleaning, laundry, community activity, and occupation  PERSONAL FACTORS: Age, Fitness, Past/current experiences, and Time since onset of injury/illness/exacerbation are also affecting patient's functional outcome.   REHAB POTENTIAL: Good  CLINICAL DECISION MAKING: Evolving/moderate complexity  EVALUATION  COMPLEXITY: Moderate   GOALS: Goals reviewed with patient? Yes  SHORT TERM GOALS: Target date: 08/21/2023   Pt will be ind with initial HEP Baseline: Goal status: INITIAL  2.  Pt will report >/=50% improvement in her overall symptoms Baseline:  Goal status: INITIAL   LONG TERM GOALS: Target date: 09/11/2023   Pt will be ind with management and progression of HEP Baseline:  Goal status: INITIAL  2.  Pt will demo full and pain free lumbar ROM for bending tasks Baseline:  Goal status: INITIAL  3.  Pt will have improved modified Oswestry to </=20% to be categorized as minimal disability Baseline:  Goal status: INITIAL  4.  Pt will report >/=75% improvement in overall symptoms Baseline:  Goal status: INITIAL  5.  Pt will demo at least 4+/5 in R LE strength for improved lumbopelvic and hip stability for standing and walking tasks Baseline:  Goal status: INITIAL    PLAN:  PT FREQUENCY: 1x/week  PT DURATION: 6 weeks  PLANNED  INTERVENTIONS: [id number]- PT Re-evaluation, 97110-Therapeutic exercises, 97530- Therapeutic activity, 97112- Neuromuscular re-education, 97535- Self Care, [id number]- Manual therapy, [id number]- Gait training, [phone]- Electrical stimulation (unattended), 97035- Ultrasound, 82956- Ionotophoresis 4mg /ml Dexamethasone, Patient/Family education, Balance training, Stair training, Dry Needling, Joint mobilization, Spinal mobilization, Cryotherapy, and Moist heat.  PLAN FOR NEXT SESSION: Assess response to HEP. Manual/dry needling if indicated. Strengthen glutes, hip IR/ER, and core. Nerve glides.    Elaisha Zahniser April Ma L Morgann Woodburn, PT 07/31/2023, 12:57 PM

## 2023-08-07 ENCOUNTER — Ambulatory Visit: Admitting: Physical Therapy

## 2023-08-14 ENCOUNTER — Ambulatory Visit: Admitting: Physical Therapy

## 2023-08-14 DIAGNOSIS — M7918 Myalgia, other site: Secondary | ICD-10-CM | POA: Diagnosis not present

## 2023-08-14 DIAGNOSIS — M5431 Sciatica, right side: Secondary | ICD-10-CM

## 2023-08-14 DIAGNOSIS — R2689 Other abnormalities of gait and mobility: Secondary | ICD-10-CM

## 2023-08-14 DIAGNOSIS — M6281 Muscle weakness (generalized): Secondary | ICD-10-CM

## 2023-08-14 DIAGNOSIS — R293 Abnormal posture: Secondary | ICD-10-CM

## 2023-08-14 NOTE — Therapy (Signed)
 OUTPATIENT PHYSICAL THERAPY TREATMENT   Patient Name: Misty Ross MRN: [id number] DOB:03-May-1991, 33 y.o., female Today's Date: 08/14/2023  END OF SESSION:  PT End of Session - 08/14/23 1233     Visit Number 2    Authorization Type BCBS    PT Start Time 1233    PT Stop Time 1315    PT Time Calculation (min) 42 min              Past Medical History:  Diagnosis Date   ADHD (attention deficit hyperactivity disorder)    Allergy    Anxiety    Depression    Eczema    Fibromyalgia    Hypertension    Migraine    Seizures (HCC)    Past Surgical History:  Procedure Laterality Date   ESOPHAGOGASTRODUODENOSCOPY     TONSILLECTOMY AND ADENOIDECTOMY     Patient Active Problem List   Diagnosis Date Noted   Migraine without aura and without status migrainosus, not intractable 01/09/2022   Depression, major, recurrent (HCC) 07/17/2014   Suicide ideation 07/17/2014   Polysubstance dependence (HCC) 07/17/2014   Major depressive disorder, recurrent, severe without psychotic features (HCC)    Aspiration pneumonia (HCC) 05/24/2014   Acute encephalopathy 05/23/2014   Suicidal ideation 05/23/2014   Polysubstance abuse (HCC) 05/23/2014   Acute respiratory failure with hypoxemia (HCC) 05/23/2014   Suicide attempt (HCC) 05/23/2014   Drug overdose, intentional (HCC)    Tobacco use disorder 03/28/2007   ADJ DISORDER W/MIXED DISTURBANCE EMOTION&CONDUCT 03/28/2007   Attention deficit hyperactivity disorder (ADHD) 03/28/2007    PCP: Carilyn Goodpasture, NP  REFERRING PROVIDER:  Anson Fret, MD   REFERRING DIAG:  M54.31 (ICD-10-CM) - Sciatica of right side  M79.18 (ICD-10-CM) - Myofascial pain syndrome of lumbar spine    Rationale for Evaluation and Treatment: Rehabilitation  THERAPY DIAG:  Sciatica, right side  Muscle weakness (generalized)  Abnormal posture  Other abnormalities of gait and mobility  ONSET DATE: 6 months ago  SUBJECTIVE:                                                                                                                                                                                            SUBJECTIVE STATEMENT: Pt states she had some difficulty doing the exercises during the weekend with working. Pt states it didn't get terribly bad on Saturday (just some shooting pain in butt cheek). Feeling it some today. Has noticed that the days she has stretched it feels better.   From eval: Pt states she's had bad R sciatic pain x 6 months. Pt reports slow onset of pain. Pt was diagnosed  with fibromyalgia 2 weeks ago. Pt states her low back hurts now. Notes difficulty sleeping. 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 Some days it will be horrible and some days it will be fine. When it starts it lasts 3-4 days. Some days it's bad that she walks with a limp. Has not tried heat or ice. Has tried to do some yoga. Has been doing cardio (elliptical).   PERTINENT HISTORY:  ADHD, anxiety, depression, fibromyalgia, migraines, substance use  PAIN:  Are you having pain? Yes: NPRS scale: 6/10 currently; at worst 8 or 9/10 Pain location: R low back, R posterior hip Pain description: sharp shooting pain down the back of her leg Aggravating factors: Not sure Relieving factors: Not sure  PRECAUTIONS: None  RED FLAGS: None   WEIGHT BEARING RESTRICTIONS: No  FALLS:  Has patient fallen in last 6 months? 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  DIAGNOSTIC FINDINGS:   DG Lumbar Spine Complete Final result 08/09/2022 12:40 PM    Narrative  CLINICAL DATA:  Back pain  EXAM: LUMBAR SPINE - COMPLETE 4+ VIEW  COMPARISON:  None  Available.  FINDINGS: There is no evidence of lumbar spine fracture. Alignment is normal. Intervertebral disc spaces are maintained. ...       DG Thoracic Spine W/Swimmers Final result 08/09/2022 12:40 PM    Narrative  CLINICAL DATA:  Thoracic back pain  EXAM: THORACIC SPINE - 3 VIEWS  COMPARISON:  08/09/2022, 04/02/2013  FINDINGS: There is no evidence of thoracic spine fracture. Alignment is normal. No other significant bone abnormalities are identified. ...       DG Cervical Spine Complete Final result 08/09/2022 12:40 PM    Narrative  CLINICAL DATA:  Neck pain  EXAM: CERVICAL SPINE - COMPLETE 4+ VIEW  COMPARISON:  08/09/2022  FINDINGS: There is no evidence of cervical spine fracture or prevertebral soft tissue swelling. Alignment is normal. No other significant bone ...      PATIENT SURVEYS:  Modified Oswestry Low Back Pain Disability Questionnaire: 16 / 50 = 32.0 %  COGNITION: Overall cognitive status: Within functional limits for tasks assessed     SENSATION: WFL  MUSCLE LENGTH: Hamstrings: Right ~60 deg; Left ~80 deg Thomas test: WNL  POSTURE: increased lumbar lordosis  PALPATION: TTP R QL into upper glute max and glute med. No particular tenderness in piriformis on eval  LUMBAR ROM:   AROM eval  Flexion 100%  Extension 100%*  Right lateral flexion To knee  Left lateral flexion To knee *  Right rotation 100%  Left rotation 100%   (Blank rows = not tested, * = pain)  LOWER EXTREMITY ROM:   WNL  Active  Right eval Left eval  Hip flexion    Hip extension    Hip abduction    Hip adduction    Hip internal rotation    Hip external rotation    Knee flexion    Knee extension    Ankle dorsiflexion    Ankle plantarflexion    Ankle inversion    Ankle eversion     (Blank rows = not tested)  LOWER EXTREMITY MMT:    MMT Right eval Left eval  Hip flexion  5 5  Hip extension 4+ 5  Hip abduction 3+ 5  Hip adduction    Hip internal  rotation 3+ 4  Hip external rotation 3+ 4  Knee flexion 4- 5  Knee extension 5 5  Ankle dorsiflexion    Ankle plantarflexion    Ankle inversion    Ankle eversion     (Blank rows = not tested)  LUMBAR SPECIAL TESTS:  Straight leg raise test: Positive, Slump test: Negative, FABER test: Positive, and Thomas test: Positive  FUNCTIONAL TESTS:  Did not assess  GAIT: Distance walked: Into clinic Assistive device utilized: None Level of assistance: Complete Independence Comments: Normal reciprocal gait pattern  TREATMENT DATE:  08/14/23 Recumbent bike L3 x 5 min Manual therapy  Skilled assessment and palpation for TPDN  Trigger Point Dry Needling  Initial Treatment: Pt instructed on Dry Needling rational, procedures, and possible side effects. Pt instructed to expect mild to moderate muscle soreness later in the day and/or into the next day.  Pt instructed in methods to reduce muscle soreness. Pt instructed to continue prescribed HEP. Patient was educated on signs and symptoms of infection and other risk factors and advised to seek medical attention should they occur.  Patient verbalized understanding of these instructions and education.   Patient Verbal Consent Given: Yes Education Handout Provided: Previously Provided Muscles Treated: R glute med, R glute max Electrical Stimulation Performed: No Treatment Response/Outcome: Decreased muscle tension  Supine piriformis/glute stretch x30" Supine LTR 5x10" Sidelying clamshell red TB x10, green TB x10 Sidelying reverse clam green TB 2x10 Sidelying hip circles x10 Prone ext with knee bent green TB 2x10 Seated hamstring stretch 2x30"   07/31/23 See HEP below                                                                                                                                 PATIENT EDUCATION:  Education details: Exam findings, POC, initial HEP, dry needling Person educated: Patient Education method: Explanation,  Demonstration, and Handouts Education comprehension: verbalized understanding, returned demonstration, and needs further education  HOME EXERCISE PROGRAM: Access Code: [email or web address] URL: [email or web address] Date: 08/14/2023 Prepared by: Vernon Prey April Kirstie Peri  Exercises - Supine Lower Trunk Rotation  - 1 x daily - 7 x weekly - 3 sets - 10 reps - Supine Piriformis Stretch with Foot on Ground  - 1 x daily - 7 x weekly - 2 sets - 30 sec hold - Standing Quadratus Lumborum Stretch with Doorway (Mirrored)  - 1 x daily - 7 x weekly - 2 sets - 30 sec hold - Clamshell with Resistance  - 1 x daily - 7 x weekly - 2 sets - 10 reps - Sidelying Reverse Clamshell with Resistance  - 1 x daily - 7 x weekly - 2 sets - 10 reps - Sidelying Hip Circles  - 1 x daily - 7 x weekly - 2 sets - 10 reps - Standing Anti-Rotation Press  with Anchored Resistance  - 1 x daily - 7 x weekly - 2 sets - 10 reps  ASSESSMENT:  CLINICAL IMPRESSION: Performed trial of TPDN to pt's glute. Pt notes improved pain by end of session. Reviewed and modified/progressed HEP. Able to tolerate progression to green TB.   From eval: Patient is a 33 y.o. F who was seen today for physical therapy evaluation and treatment for R sciatica. PMH significant for recent diagnosis of fibromyalgia. Assessment is significant for R > L hip weakness, R>L core weakness, and tenderness and tightness in R glute med/max and QL likely causing sciatic nerve irritation in lower lumbar. No over tightness or tenderness noted in R piriformis. Pt will benefit from PT to address these issues to improve her symptoms for home and work tasks.   OBJECTIVE IMPAIRMENTS: Abnormal gait, decreased activity tolerance, decreased coordination, decreased endurance, decreased mobility, difficulty walking, decreased ROM, decreased strength, increased fascial restrictions, increased muscle spasms, impaired sensation, improper body mechanics, postural dysfunction, and  pain.     GOALS: Goals reviewed with patient? Yes  SHORT TERM GOALS: Target date: 08/21/2023   Pt will be ind with initial HEP Baseline: Goal status: INITIAL  2.  Pt will report >/=50% improvement in her overall symptoms Baseline:  Goal status: INITIAL   LONG TERM GOALS: Target date: 09/11/2023   Pt will be ind with management and progression of HEP Baseline:  Goal status: INITIAL  2.  Pt will demo full and pain free lumbar ROM for bending tasks Baseline:  Goal status: INITIAL  3.  Pt will have improved modified Oswestry to </=20% to be categorized as minimal disability Baseline:  Goal status: INITIAL  4.  Pt will report >/=75% improvement in overall symptoms Baseline:  Goal status: INITIAL  5.  Pt will demo at least 4+/5 in R LE strength for improved lumbopelvic and hip stability for standing and walking tasks Baseline:  Goal status: INITIAL    PLAN:  PT FREQUENCY: 1x/week  PT DURATION: 6 weeks  PLANNED INTERVENTIONS: 97164- PT Re-evaluation, 97110-Therapeutic exercises, 97530- Therapeutic activity, 97112- Neuromuscular re-education, 97535- Self Care, 16109- Manual therapy, [id number]- Gait training, [phone]- Electrical stimulation (unattended), 97035- Ultrasound, 09811- Ionotophoresis 4mg /ml Dexamethasone, Patient/Family education, Balance training, Stair training, Dry Needling, Joint mobilization, Spinal mobilization, Cryotherapy, and Moist heat.  PLAN FOR NEXT SESSION: Assess response to HEP. Manual/dry needling if indicated. Strengthen glutes, hip IR/ER, and core. Nerve glides.    Leshonda Galambos April Ma L Liara Holm, PT 08/14/2023, 12:33 PM

## 2023-08-21 ENCOUNTER — Telehealth: Payer: Self-pay | Admitting: Rehabilitative and Restorative Service Providers"

## 2023-08-21 ENCOUNTER — Encounter: Payer: Self-pay | Admitting: Rehabilitative and Restorative Service Providers"

## 2023-08-21 ENCOUNTER — Ambulatory Visit: Attending: Neurology | Admitting: Rehabilitative and Restorative Service Providers"

## 2023-08-21 DIAGNOSIS — R293 Abnormal posture: Secondary | ICD-10-CM | POA: Insufficient documentation

## 2023-08-21 DIAGNOSIS — M5431 Sciatica, right side: Secondary | ICD-10-CM | POA: Insufficient documentation

## 2023-08-21 DIAGNOSIS — R2689 Other abnormalities of gait and mobility: Secondary | ICD-10-CM | POA: Insufficient documentation

## 2023-08-21 DIAGNOSIS — M6281 Muscle weakness (generalized): Secondary | ICD-10-CM | POA: Insufficient documentation

## 2023-08-21 NOTE — Telephone Encounter (Signed)
 Called patient due to missed visit on 08/21/23 and unable to leave message.  Sent patient a My Chart message to notify of missed visit and remind of Attendance policy.

## 2023-08-28 ENCOUNTER — Ambulatory Visit: Admitting: Rehabilitative and Restorative Service Providers"

## 2023-08-28 ENCOUNTER — Encounter: Payer: Self-pay | Admitting: Rehabilitative and Restorative Service Providers"

## 2023-08-28 DIAGNOSIS — M6281 Muscle weakness (generalized): Secondary | ICD-10-CM

## 2023-08-28 DIAGNOSIS — R2689 Other abnormalities of gait and mobility: Secondary | ICD-10-CM | POA: Diagnosis not present

## 2023-08-28 DIAGNOSIS — M5431 Sciatica, right side: Secondary | ICD-10-CM | POA: Diagnosis not present

## 2023-08-28 DIAGNOSIS — R293 Abnormal posture: Secondary | ICD-10-CM

## 2023-08-28 NOTE — Therapy (Addendum)
 OUTPATIENT PHYSICAL THERAPY TREATMENT LATE ENTRY DISCHARGE SUMMARY   Patient Name: Misty Ross MRN: [id number] DOB:[birth year], 33 y.o., female Today's Date: 08/28/2023  END OF SESSION:  PT End of Session - 08/28/23 1101     Visit Number 3    Date for PT Re-Evaluation 09/11/23    Authorization Type BCBS    Authorization Time Period 07/31/2023 - 09/28/2023    Authorization - Visit Number 3    Authorization - Number of Visits 5    PT Start Time 1100    PT Stop Time 1140    PT Time Calculation (min) 40 min    Activity Tolerance Patient tolerated treatment well    Behavior During Therapy WFL for tasks assessed/performed              Past Medical History:  Diagnosis Date   ADHD (attention deficit hyperactivity disorder)    Allergy    Anxiety    Depression    Eczema    Fibromyalgia    Hypertension    Migraine    Seizures (HCC)    Past Surgical History:  Procedure Laterality Date   ESOPHAGOGASTRODUODENOSCOPY     TONSILLECTOMY AND ADENOIDECTOMY     Patient Active Problem List   Diagnosis Date Noted   Migraine without aura and without status migrainosus, not intractable 01/09/2022   Depression, major, recurrent (HCC) 07/17/2014   Suicide ideation 07/17/2014   Polysubstance dependence (HCC) 07/17/2014   Major depressive disorder, recurrent, severe without psychotic features (HCC)    Aspiration pneumonia (HCC) 05/24/2014   Acute encephalopathy 05/23/2014   Suicidal ideation 05/23/2014   Polysubstance abuse (HCC) 05/23/2014   Acute respiratory failure with hypoxemia (HCC) 05/23/2014   Suicide attempt (HCC) 05/23/2014   Drug overdose, intentional (HCC)    Tobacco use disorder 03/28/2007   ADJ DISORDER W/MIXED DISTURBANCE EMOTION&CONDUCT 03/28/2007   Attention deficit hyperactivity disorder (ADHD) 03/28/2007    PCP: Chyrel Craw, NP  REFERRING PROVIDER:  Glory Larsen, MD   REFERRING DIAG:  M54.31 (ICD-10-CM) - Sciatica of right side  M79.18 (ICD-10-CM) -  Myofascial pain syndrome of lumbar spine    Rationale for Evaluation and Treatment: Rehabilitation  THERAPY DIAG:  Sciatica, right side  Muscle weakness (generalized)  Abnormal posture  Other abnormalities of gait and mobility  ONSET DATE: 6 months ago  SUBJECTIVE:                                                                                                                                                                                           SUBJECTIVE STATEMENT: Patient reports that she has been having pain, stating that she is  still having difficulty sleeping.  PERTINENT HISTORY:  ADHD, anxiety, depression, fibromyalgia, migraines, substance use  PAIN:  Are you having pain? Yes: NPRS scale: 7/10 Pain location: mid to low back Pain description: sharp shooting pain down the back of her leg Aggravating factors: Not sure Relieving factors: Not sure  PRECAUTIONS: None  RED FLAGS: None   WEIGHT BEARING RESTRICTIONS: No  FALLS:  Has patient fallen in last 6 months? 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  DIAGNOSTIC FINDINGS:   DG Lumbar Spine Complete Final result 08/09/2022 12:40 PM    Narrative  CLINICAL DATA:  Back pain  EXAM: LUMBAR SPINE - COMPLETE 4+ VIEW  COMPARISON:  None Available.  FINDINGS: There is no evidence of lumbar spine fracture. Alignment is normal. Intervertebral disc spaces are maintained. ...       DG Thoracic Spine W/Swimmers Final result 08/09/2022 12:40 PM    Narrative  CLINICAL DATA:  Thoracic back pain  EXAM: THORACIC SPINE - 3 VIEWS  COMPARISON:  08/09/2022, 04/02/2013  FINDINGS: There is no evidence of  thoracic spine fracture. Alignment is normal. No other significant bone abnormalities are identified. ...       DG Cervical Spine Complete Final result 08/09/2022 12:40 PM    Narrative  CLINICAL DATA:  Neck pain  EXAM: CERVICAL SPINE - COMPLETE 4+ VIEW  COMPARISON:  08/09/2022  FINDINGS: There is no evidence of cervical spine fracture or prevertebral soft tissue swelling. Alignment is normal. No other significant bone ...      PATIENT SURVEYS:  Eval:  Modified Oswestry Low Back Pain Disability Questionnaire: 16 / 50 = 32.0 %  COGNITION: Overall cognitive status: Within functional limits for tasks assessed     SENSATION: WFL  MUSCLE LENGTH: Hamstrings: Right ~60 deg; Left ~80 deg Thomas test: WNL  POSTURE: increased lumbar lordosis  PALPATION: TTP R QL into upper glute max and glute med. No particular tenderness in piriformis on eval  LUMBAR ROM:   AROM eval  Flexion 100%  Extension 100%*  Right lateral flexion To knee  Left lateral flexion To knee *  Right rotation 100%  Left rotation 100%   (Blank rows = not tested, * = pain)  LOWER EXTREMITY ROM:   WNL  Active  Right eval Left eval  Hip flexion    Hip extension    Hip abduction    Hip adduction    Hip internal rotation    Hip external rotation    Knee flexion    Knee extension    Ankle dorsiflexion    Ankle plantarflexion    Ankle inversion    Ankle eversion     (Blank rows = not tested)  LOWER EXTREMITY MMT:    MMT Right eval Left eval  Hip flexion 5 5  Hip extension 4+ 5  Hip abduction 3+ 5  Hip adduction    Hip internal rotation 3+ 4  Hip external rotation 3+ 4  Knee flexion 4- 5  Knee extension 5 5  Ankle dorsiflexion    Ankle plantarflexion    Ankle inversion    Ankle eversion     (Blank rows = not tested)  LUMBAR SPECIAL TESTS:  Straight leg raise test:  Positive, Slump test: Negative, FABER test: Positive, and Thomas test: Positive  FUNCTIONAL TESTS:  Did not  assess  GAIT: Distance walked: Into clinic Assistive device utilized: None Level of assistance: Complete Independence Comments: Normal reciprocal gait pattern  TREATMENT DATE:  08/28/2023: Nustep level 5 x7 min with PT present to discuss status Seated 3 way green pball rollout 3x10 sec each Seated transversus abdominus contraction with pressing hands into ball on thighs 2x10 Seated hamstring stretch 2x20 sec bilat Seated piriformis stretch 2x20 sec bilat Supine 90/90 hamstring/sciatic nerve stretch x10 bilat Lower trunk rotation x10 Supine isometric dead bug 3x10 sec Supine marching with transversus abdominus engagement 2x10   08/14/23 Recumbent bike L3 x 5 min Manual therapy  Skilled assessment and palpation for TPDN  Trigger Point Dry Needling  Initial Treatment: Pt instructed on Dry Needling rational, procedures, and possible side effects. Pt instructed to expect mild to moderate muscle soreness later in the day and/or into the next day.  Pt instructed in methods to reduce muscle soreness. Pt instructed to continue prescribed HEP. Patient was educated on signs and symptoms of infection and other risk factors and advised to seek medical attention should they occur.  Patient verbalized understanding of these instructions and education.   Patient Verbal Consent Given: Yes Education Handout Provided: Previously Provided Muscles Treated: R glute med, R glute max Electrical Stimulation Performed: No Treatment Response/Outcome: Decreased muscle tension  Supine piriformis/glute stretch x30" Supine LTR 5x10" Sidelying clamshell red TB x10, green TB x10 Sidelying reverse clam green TB 2x10 Sidelying hip circles x10 Prone ext with knee bent green TB 2x10 Seated hamstring stretch 2x30"    PATIENT EDUCATION:  Education details: Exam findings, POC, initial HEP, dry needling Person educated: Patient Education method: Explanation, Demonstration, and Handouts Education  comprehension: verbalized understanding, returned demonstration, and needs further education  HOME EXERCISE PROGRAM: Access Code: [email or web address] URL: [email or web address] Date: 08/28/2023 Prepared by: Chaneta Comer Rosenda Geffrard  Exercises - Standing Quadratus Lumborum Stretch with Doorway (Mirrored)  - 1 x daily - 7 x weekly - 2 sets - 30 sec hold - Standing Anti-Rotation Press with Anchored Resistance  - 1 x daily - 7 x weekly - 2 sets - 10 reps - Supine Lower Trunk Rotation  - 1 x daily - 7 x weekly - 3 sets - 10 reps - Supine Piriformis Stretch with Foot on Ground  - 1 x daily - 7 x weekly - 2 sets - 30 sec hold - Clamshell with Resistance  - 1 x daily - 7 x weekly - 2 sets - 10 reps - Sidelying Reverse Clamshell with Resistance  - 1 x daily - 7 x weekly - 2 sets - 10 reps - Sidelying Hip Circles  - 1 x daily - 7 x weekly - 2 sets - 10 reps - Isometric Dead Bug  - 1 x daily - 7 x weekly - 3 reps - 15 sec hold - Hooklying Active Hamstring Stretch  - 1 x daily - 7 x weekly - 10 reps - Supine March  - 1 x daily - 7 x weekly - 2 sets - 10 reps  ASSESSMENT:  CLINICAL IMPRESSION: Abisai presents to skilled PT reporting that she continues to have increased pain.  Patient able to progress with flexibility and core stability during session today.  Added 90/90 active hamstring/sciatic nerve stretch today and patient reported that this seemed to help with some of her tightness.  Performed increased exercises today for core stability exercises.  Patient reporting  that she seems to be feeling better with the stretches and exercises during session.  Patient agreeable to another dry needling session and it helped her hip pain greatly last session.  Patient with great twitch response during dry needling and reports feeling decreased pain with soft tissue mobilization following needling.  Patient reported pain had decreased to 2/10 by completion of session.  OBJECTIVE IMPAIRMENTS: Abnormal gait, decreased  activity tolerance, decreased coordination, decreased endurance, decreased mobility, difficulty walking, decreased ROM, decreased strength, increased fascial restrictions, increased muscle spasms, impaired sensation, improper body mechanics, postural dysfunction, and pain.     GOALS: Goals reviewed with patient? Yes  SHORT TERM GOALS: Target date: 08/21/2023   Pt will be ind with initial HEP Baseline: Goal status: Met on 08/28/23  2.  Pt will report >/=50% improvement in her overall symptoms Baseline:  Goal status: Ongoing   LONG TERM GOALS: Target date: 09/11/2023   Pt will be ind with management and progression of HEP Baseline:  Goal status: INITIAL  2.  Pt will demo full and pain free lumbar ROM for bending tasks Baseline:  Goal status: INITIAL  3.  Pt will have improved modified Oswestry to </=20% to be categorized as minimal disability Baseline:  Goal status: INITIAL  4.  Pt will report >/=75% improvement in overall symptoms Baseline:  Goal status: INITIAL  5.  Pt will demo at least 4+/5 in R LE strength for improved lumbopelvic and hip stability for standing and walking tasks Baseline:  Goal status: INITIAL    PLAN:  PT FREQUENCY: 1x/week  PT DURATION: 6 weeks  PLANNED INTERVENTIONS: 97164- PT Re-evaluation, 97110-Therapeutic exercises, 97530- Therapeutic activity, 97112- Neuromuscular re-education, 97535- Self Care, 16109- Manual therapy, [id number]- Gait training, [phone]- Electrical stimulation (unattended), 97035- Ultrasound, 09811- Ionotophoresis 4mg /ml Dexamethasone , Patient/Family education, Balance training, Stair training, Dry Needling, Joint mobilization, Spinal mobilization, Cryotherapy, and Moist heat.  PLAN FOR NEXT SESSION: Assess response to HEP. Manual/dry needling if indicated. Strengthen glutes, hip IR/ER, and core. Nerve glides.    Robyne Christen, PT, DPT 08/28/23, 12:04 PM  [facility] [address] [place], [place] [address] Phone # [phone] Fax [phone]   PHYSICAL THERAPY DISCHARGE SUMMARY  As of 10/30/23, patient has not returned for further visits and discharged from PT at this time.  Please see above for most recent PT update.  Patient agrees to discharge. Patient goals were partially met. Patient is being discharged due to not returning since the last visit.  Chaneta Comer Rian Busche, PT, DPT 10/30/23, 10:21 AM

## 2023-09-04 ENCOUNTER — Ambulatory Visit: Admitting: Rehabilitative and Restorative Service Providers"

## 2023-09-04 ENCOUNTER — Telehealth: Payer: Self-pay | Admitting: Rehabilitative and Restorative Service Providers"

## 2023-09-04 NOTE — Telephone Encounter (Signed)
 Patient called and left a message secondary to 2nd missed PT appointment.  Patient reminded of attendance policy.  Asked to please call to cancel/reschedule if she could not make the next/last scheduled visit.

## 2023-09-05 ENCOUNTER — Telehealth: Payer: Self-pay | Admitting: Neurology

## 2023-09-05 NOTE — Telephone Encounter (Signed)
 Spoke with patient. Patient stated she received Dr. Harding Li message and that she seems to be doing better and the tingling is not as bad as it was and stated she would call back if symptoms worsen

## 2023-09-05 NOTE — Telephone Encounter (Signed)
 I called patient, I have to reschedule her emg. Left a message. Please call her. The last appointment we suggested she wear wrist splints and if needed to come for the emg/ncs.If she still would like the emg/ncs, I can offer her May 8th at 10 (I only need an hour) or I can find another spot unless she is feeling better and we wait to postpone a littl ebit morethank you

## 2023-09-06 ENCOUNTER — Encounter: Admitting: Neurology

## 2023-09-11 ENCOUNTER — Ambulatory Visit: Admitting: Rehabilitative and Restorative Service Providers"

## 2023-09-21 DIAGNOSIS — F411 Generalized anxiety disorder: Secondary | ICD-10-CM | POA: Diagnosis not present

## 2023-09-21 DIAGNOSIS — N3281 Overactive bladder: Secondary | ICD-10-CM | POA: Diagnosis not present

## 2023-09-21 DIAGNOSIS — M797 Fibromyalgia: Secondary | ICD-10-CM | POA: Diagnosis not present

## 2023-09-21 DIAGNOSIS — K219 Gastro-esophageal reflux disease without esophagitis: Secondary | ICD-10-CM | POA: Diagnosis not present

## 2023-09-24 DIAGNOSIS — F902 Attention-deficit hyperactivity disorder, combined type: Secondary | ICD-10-CM | POA: Diagnosis not present

## 2023-09-24 DIAGNOSIS — F331 Major depressive disorder, recurrent, moderate: Secondary | ICD-10-CM | POA: Diagnosis not present

## 2023-09-24 DIAGNOSIS — F411 Generalized anxiety disorder: Secondary | ICD-10-CM | POA: Diagnosis not present

## 2023-09-24 DIAGNOSIS — F519 Sleep disorder not due to a substance or known physiological condition, unspecified: Secondary | ICD-10-CM | POA: Diagnosis not present

## 2023-10-01 ENCOUNTER — Ambulatory Visit: Payer: BC Managed Care – PPO | Admitting: Family Medicine

## 2023-10-01 NOTE — Progress Notes (Unsigned)
 10/01/23 ALL: Misty Ross returns for Botox . This is third procedure. She reports migraines are well managed. She may have 1-2 migraine days per month. She continues Ajovy . Rizatriptan  usually helps. Dissolvable tablet not covered. Will send regular tablet. Recently diagnosed with fibromyalgia. Started on modafinil and duloxetine by PCP.   06/28/2023 ALL: Misty Ross returns for Botox . First procedure 12/2022. She reports significant improvement in headaches after first procedure. She was doing great. Missed procedure 02/2023 but reports migraines remained well managed until the past 2-3 weeks. She feels weather changes have triggered more headaches. She tolerated last procedure well. She continues Ajovy  and rizatriptan .   12/21/2022 ALL: Misty Ross presents for first Botox  procedure. She continues Ajovy  and rizatriptan . She has 15 headache days with 8-9 migraines each month on current therapy.     Consent Form Botulism Toxin Injection For Chronic Migraine    Reviewed orally with patient, additionally signature is on file:  Botulism toxin has been approved by the Federal drug administration for treatment of chronic migraine. Botulism toxin does not cure chronic migraine and it may not be effective in some patients.  The administration of botulism toxin is accomplished by injecting a small amount of toxin into the muscles of the neck and head. Dosage must be titrated for each individual. Any benefits resulting from botulism toxin tend to wear off after 3 months with a repeat injection required if benefit is to be maintained. Injections are usually done every 3-4 months with maximum effect peak achieved by about 2 or 3 weeks. Botulism toxin is expensive and you should be sure of what costs you will incur resulting from the injection.  The side effects of botulism toxin use for chronic migraine may include:   -Transient, and usually mild, facial weakness with facial injections  -Transient, and usually mild,  head or neck weakness with head/neck injections  -Reduction or loss of forehead facial animation due to forehead muscle weakness  -Eyelid drooping  -Dry eye  -Pain at the site of injection or bruising at the site of injection  -Double vision  -Potential unknown long term risks   Contraindications: You should not have Botox  if you are pregnant, nursing, allergic to albumin, have an infection, skin condition, or muscle weakness at the site of the injection, or have myasthenia gravis, Lambert-Eaton syndrome, or ALS.  It is also possible that as with any injection, there may be an allergic reaction or no effect from the medication. Reduced effectiveness after repeated injections is sometimes seen and rarely infection at the injection site may occur. All care will be taken to prevent these side effects. If therapy is given over a long time, atrophy and wasting in the muscle injected may occur. Occasionally the patient's become refractory to treatment because they develop antibodies to the toxin. In this event, therapy needs to be modified.  I have read the above information and consent to the administration of botulism toxin.    BOTOX  PROCEDURE NOTE FOR MIGRAINE HEADACHE  Contraindications and precautions discussed with patient(above). Aseptic procedure was observed and patient tolerated procedure. Procedure performed by Terrilyn Fick, FNP-C.   The condition has existed for more than 6 months, and pt does not have a diagnosis of ALS, Myasthenia Gravis or Lambert-Eaton Syndrome.  Risks and benefits of injections discussed and pt agrees to proceed with the procedure.  Written consent obtained  These injections are medically necessary. Pt  receives good benefits from these injections. These injections do not cause sedations or hallucinations which the  oral therapies may cause.   Description of procedure:  The patient was placed in a sitting position. The standard protocol was used for Botox  as  follows, with 5 units of Botox  injected at each site:  -Procerus muscle, midline injection  -Corrugator muscle, bilateral injection  -Frontalis muscle, bilateral injection, with 2 sites each side, medial injection was performed in the upper one third of the frontalis muscle, in the region vertical from the medial inferior edge of the superior orbital rim. The lateral injection was again in the upper one third of the forehead vertically above the lateral limbus of the cornea, 1.5 cm lateral to the medial injection site.  -Temporalis muscle injection, 4 sites, bilaterally. The first injection was 3 cm above the tragus of the ear, second injection site was 1.5 cm to 3 cm up from the first injection site in line with the tragus of the ear. The third injection site was 1.5-3 cm forward between the first 2 injection sites. The fourth injection site was 1.5 cm posterior to the second injection site. 5th site laterally in the temporalis  muscleat the level of the outer canthus.  -Occipitalis muscle injection, 3 sites, bilaterally. The first injection was done one half way between the occipital protuberance and the tip of the mastoid process behind the ear. The second injection site was done lateral and superior to the first, 1 fingerbreadth from the first injection. The third injection site was 1 fingerbreadth superiorly and medially from the first injection site.  -Cervical paraspinal muscle injection, 2 sites, bilaterally. The first injection site was 1 cm from the midline of the cervical spine, 3 cm inferior to the lower border of the occipital protuberance. The second injection site was 1.5 cm superiorly and laterally to the first injection site.  -Trapezius muscle injection was performed at 3 sites, bilaterally. The first injection site was in the upper trapezius muscle halfway between the inflection point of the neck, and the acromion. The second injection site was one half way between the acromion and  the first injection site. The third injection was done between the first injection site and the inflection point of the neck.   Will return for repeat injection in 3 months.   A total of 200 units of Botox  was prepared, 155 units of Botox  was injected as documented above, any Botox  not injected was wasted. The patient tolerated the procedure well, there were no complications of the above procedure.

## 2023-10-02 ENCOUNTER — Ambulatory Visit: Payer: BC Managed Care – PPO | Admitting: Family Medicine

## 2023-10-02 VITALS — Ht 59.0 in | Wt 149.5 lb

## 2023-10-02 DIAGNOSIS — G43E19 Chronic migraine with aura, intractable, without status migrainosus: Secondary | ICD-10-CM | POA: Diagnosis not present

## 2023-10-02 MED ORDER — ONABOTULINUMTOXINA 200 UNITS IJ SOLR
155.0000 [IU] | Freq: Once | INTRAMUSCULAR | Status: AC
  Administered 2023-10-02: 155 [IU] via INTRAMUSCULAR

## 2023-10-02 MED ORDER — RIZATRIPTAN BENZOATE 10 MG PO TABS
10.0000 mg | ORAL_TABLET | ORAL | 11 refills | Status: AC | PRN
Start: 2023-10-02 — End: ?

## 2023-10-02 NOTE — Progress Notes (Signed)
 Botox -200U x 1vial Lot: [id number] Expiration:02/2026 NDC: 0023-3921-02  Bacteriostatic 0.9% Sodium Chloride - 4mL total BJY:[id number] Expiration:03/22/2024 NDC: 2130-8657-84  Buy/Bill  Witnessed ON:GEXBMW Del Favia, CMA

## 2023-10-10 DIAGNOSIS — M549 Dorsalgia, unspecified: Secondary | ICD-10-CM | POA: Diagnosis not present

## 2023-10-10 DIAGNOSIS — M62838 Other muscle spasm: Secondary | ICD-10-CM | POA: Diagnosis not present

## 2023-10-16 ENCOUNTER — Encounter: Payer: Self-pay | Admitting: Family Medicine

## 2023-10-23 DIAGNOSIS — F411 Generalized anxiety disorder: Secondary | ICD-10-CM | POA: Diagnosis not present

## 2023-10-23 DIAGNOSIS — F331 Major depressive disorder, recurrent, moderate: Secondary | ICD-10-CM | POA: Diagnosis not present

## 2023-10-23 DIAGNOSIS — F519 Sleep disorder not due to a substance or known physiological condition, unspecified: Secondary | ICD-10-CM | POA: Diagnosis not present

## 2023-10-23 DIAGNOSIS — F902 Attention-deficit hyperactivity disorder, combined type: Secondary | ICD-10-CM | POA: Diagnosis not present

## 2023-12-05 DIAGNOSIS — R519 Headache, unspecified: Secondary | ICD-10-CM | POA: Diagnosis not present

## 2023-12-21 DIAGNOSIS — F411 Generalized anxiety disorder: Secondary | ICD-10-CM | POA: Diagnosis not present

## 2023-12-21 DIAGNOSIS — L601 Onycholysis: Secondary | ICD-10-CM | POA: Diagnosis not present

## 2023-12-21 DIAGNOSIS — M62830 Muscle spasm of back: Secondary | ICD-10-CM | POA: Diagnosis not present

## 2023-12-25 ENCOUNTER — Encounter: Payer: Self-pay | Admitting: Adult Health

## 2023-12-25 ENCOUNTER — Telehealth (INDEPENDENT_AMBULATORY_CARE_PROVIDER_SITE_OTHER): Admitting: Adult Health

## 2023-12-25 VITALS — Ht 59.0 in

## 2023-12-25 DIAGNOSIS — F411 Generalized anxiety disorder: Secondary | ICD-10-CM

## 2023-12-25 DIAGNOSIS — F902 Attention-deficit hyperactivity disorder, combined type: Secondary | ICD-10-CM | POA: Diagnosis not present

## 2023-12-25 DIAGNOSIS — F331 Major depressive disorder, recurrent, moderate: Secondary | ICD-10-CM

## 2023-12-25 MED ORDER — CLONAZEPAM 0.5 MG PO TABS: ORAL_TABLET | ORAL | 0 refills | Status: DC

## 2023-12-25 NOTE — Progress Notes (Signed)
 Virtual Visit via Video Note  I connected with pt @ on 12/25/23 at 11:00 AM EDT by a video enabled telemedicine application and verified that I am speaking with the correct person using two identifiers.   I discussed the limitations of evaluation and management by telemedicine and the availability of in person appointments. The patient expressed understanding and agreed to proceed.  I discussed the assessment and treatment plan with the patient. The patient was provided an opportunity to ask questions and all were answered. The patient agreed with the plan and demonstrated an understanding of the instructions.   The patient was advised to call back or seek an in-person evaluation if the symptoms worsen or if the condition fails to improve as anticipated.  I provided 60 minutes of non-face-to-face time during this encounter.  The patient was located at home.  The provider was located at [facility].   Angeline LOISE Sayers, NP    [facility] MD/PA/NP Initial Note  12/25/2023 11:39 AM Amyria Komar  MRN:  [id number]  Chief Complaint:   HPI:   Patient seen today for initial psychiatric evaluation.   Previously seen by [facility] - last visit with previous psychiatrist was a month ago.  Diagnosed with Fibromyalgia 6 months ago - increased pain - sleep issues.   Describes mood today as could be better. Pleasant. Denies tearfulness.  Mood symptoms - reports some depressive symptoms since stopping the Lexapro and starting the Cymbalta 5 days ago. Reports stable interest and motivation. Reports anxiety and irritability most days - varying degrees. Reports high levels of anxiety twice a week. Reports panic attacks - a couple of times a week. Reports worry, rumination and over thinking. Reports obsessive thoughts and acts - I can't sit down until things are off the floor - I have to do a certain thing before I can go onto the next things. Reports mood as stable with exception of  anxiety. Stating I feel like I struggle with anxiety the most. Reports starting the Cymbalta 20mg  daily less than a week ago. Also, taking Clonazepam  as needed -  a few times a week. Taking medications as prescribed.  Energy levels lower - getting really bad dips during the day - probably related to my inconsistent sleep schedule.  Active, does not have a regular exercise routine. Trying to go to the gym when she can. Enjoys some usual interests and activities. Single. Currently living with parents. Spending time with family and friends.  Appetite adequate - eating once a day in the evening. Reports weight gain - hard to stay active with the fibromyalgia. Sleeps better some nights than others - nothing consistent it's variable. Reports using Clonazepam  some nights to help with sleep. Focus and concentration stable - it's alright. Reports some brain fog associated Fibromyalgia. Completing tasks. Managing aspects of household. 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 Denies SI or HI.  Denies AH or VH. Denies self harm. Denies substance use x 2 months.  Previous medication trials:   Lexapro Wellbutrin  Zoloft Prozac  Clonazepam  Lamictal Modafinil Gabapentin Buspar Lamictal  Cymbalta - recently started for fibromyalgia Trazadone  Visit Diagnosis:    ICD-10-CM   1. Attention deficit hyperactivity disorder (ADHD), combined type  F90.2     2. Generalized anxiety disorder  F41.1     3. Major depressive disorder, recurrent episode, moderate (HCC)  F33.1       Past Psychiatric History: Reports an admission in 2007 -  [facility].  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   Past Medical History:  Past Medical History:  Diagnosis Date   ADHD (attention deficit hyperactivity disorder)    Allergy    Anxiety    Depression    Eczema     Fibromyalgia    Hypertension    Migraine    Seizures (HCC)     Past Surgical History:  Procedure Laterality Date   ESOPHAGOGASTRODUODENOSCOPY     TONSILLECTOMY AND ADENOIDECTOMY      Family Psychiatric History: Family history of mental illness - mother with anxiety. Reports father has ADHD.  Family History:  Family History  Problem Relation Age of Onset   Hyperlipidemia Father    Migraines Father    Multiple sclerosis Brother    Cancer Maternal Grandmother    Diabetes Maternal Grandmother    Cancer Maternal Grandfather    Heart disease Paternal Grandmother    Cancer Paternal Grandfather     Social History:  Social History   Socioeconomic History   Marital status: Single    Spouse name: Not on file   Number of children: Not on file   Years of education: Not on file   Highest education level: Not on file  Occupational History   Not on file  Tobacco Use   Smoking status: Former    Current packs/day: 0.50    Types: Cigarettes   Smokeless tobacco: Never  Vaping Use   Vaping status: Every Day   Substances: Nicotine   Substance and Sexual Activity   Alcohol use: Not Currently    Comment: one beer per day   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   Sexual activity: Not on file  Other Topics Concern   Not on file  Social History Narrative   Right handed   Caffeine: 1-2 cups per day    Lives with her family   Social Drivers of Corporate investment banker Strain: Not on file  Food Insecurity: Not on file  Transportation Needs: Not on file  Physical Activity: Not on file  Stress: Not on file  Social Connections: Not on file    Allergies:  Allergies  Allergen Reactions   Benadryl  [Diphenhydramine ]     Feels like skin is crawling    Metabolic Disorder Labs: No results found for: HGBA1C, MPG No results found for: PROLACTIN No results found for: CHOL, TRIG, HDL, CHOLHDL, VLDL, LDLCALC No results found for: TSH  Therapeutic Level  Labs: No results found for: LITHIUM No results found for: VALPROATE No results found for: CBMZ  Current Medications: Current Outpatient Medications  Medication Sig Dispense Refill   clonazePAM  (KLONOPIN ) 0.5 MG tablet Take 0.5 mg by mouth as needed.     diclofenac (VOLTAREN) 75 MG EC tablet Take 75 mg by mouth 2 (two) times daily as needed.     escitalopram (LEXAPRO) 20 MG tablet Take 20 mg by mouth daily.     Fremanezumab -vfrm (AJOVY ) 225 MG/1.5ML SOAJ Inject 225 mg into the skin every 30 (thirty) days. 3 mL 3   lamoTRIgine (LAMICTAL) 200 MG tablet Take 200 mg by mouth daily.     metoprolol tartrate (LOPRESSOR) 50 MG tablet Take 75 mg by mouth 2 (two) times daily.     modafinil (PROVIGIL) 100 MG tablet Take 100 mg by mouth daily.  norethindrone (MICRONOR) 0.35 MG tablet Take 1 tablet by mouth daily.     ondansetron  (ZOFRAN -ODT) 4 MG disintegrating tablet TAKE 1-2 TABLETS (4-8 MG TOTAL) BY MOUTH EVERY 8 (EIGHT) HOURS AS NEEDED. GREAT FOR NAUSEA OR MOTION SICKNESS. CAN ALSO TAKE IT WITH RIZATRIPTAN  OR NURTEC FOR MIGRAINES 30 tablet 3   pantoprazole  (PROTONIX ) 40 MG tablet Take 40 mg by mouth every morning.     rizatriptan  (MAXALT ) 10 MG tablet Take 1 tablet (10 mg total) by mouth as needed for migraine. May repeat in 2 hours if needed 10 tablet 11   solifenacin (VESICARE) 5 MG tablet Take 5 mg by mouth daily.     Vitamin D, Ergocalciferol, (DRISDOL) 1.25 MG (50000 UNIT) CAPS capsule Take 50,000 Units by mouth once a week.     No current facility-administered medications for this visit.    Medication Side Effects: none  Orders placed this visit:  No orders of the defined types were placed in this encounter.   Psychiatric Specialty Exam:  Review of Systems  Musculoskeletal:  Negative for gait problem.  Neurological:  Negative for tremors.  Psychiatric/Behavioral:         Please refer to HPI    There were no vitals taken for this visit.There is no height or weight on file  to calculate BMI.  General Appearance: Casual and Neat  Eye Contact:  Good  Speech:  Clear and Coherent and Normal Rate  Volume:  Normal  Mood:  Anxious, Depressed, and Irritable  Affect:  Appropriate and Congruent  Thought Process:  Coherent and Descriptions of Associations: Intact  Orientation:  Full (Time, Place, and Person)  Thought Content: Logical   Suicidal Thoughts:  No  Homicidal Thoughts:  No  Memory:  WNL  Judgement:  Good  Insight:  Good  Psychomotor Activity:  Normal  Concentration:  Concentration: Good and Attention Span: Good  Recall:  Good  Fund of Knowledge: Good  Language: Good  Assets:  Communication Skills Desire for Improvement Financial Resources/Insurance Housing Intimacy Leisure Time Physical Health Resilience Social Support Talents/Skills Transportation Vocational/Educational  ADL's:  Intact  Cognition: WNL  Prognosis:  Good   Screenings:  Flowsheet Row ED from 02/17/2023 in [facility] Department at [facility] ED from 01/19/2023 in [facility] Emergency Department at [facility] ED from 04/23/2021 in [facility] Emergency Department at [facility]  C-SSRS RISK CATEGORY No Risk No Risk No Risk    Receiving Psychotherapy: No   Treatment Plan/Recommendations: Plan:  PDMP reviewed  Started Cymbalta 20mg  daily x 5 days - will increase to 40mg  daily Clonazepam  0.5mg  daily as needed for sleep and anxiety attacks - 5 remaining - filled on 12/21/2023. Has tried multiple medications and feels the Clonazepam  works well.  60 minutes spent dedicated to the care of this patient on the date of this encounter to include pre-visit review of records, ordering of medication, post visit documentation, and face-to-face time with the patient discussing depression, anxiety and ADD. Discussed continuing current medication regimen.  RTC 4 weeks  Patient advised to contact office with any questions, adverse effects, or acute  worsening in signs and symptoms.  Aariah Godette N Sinia Antosh, NP

## 2024-01-02 ENCOUNTER — Ambulatory Visit: Admitting: Family Medicine

## 2024-01-02 VITALS — BP 124/75 | HR 72

## 2024-01-02 DIAGNOSIS — G629 Polyneuropathy, unspecified: Secondary | ICD-10-CM | POA: Insufficient documentation

## 2024-01-02 DIAGNOSIS — G43E19 Chronic migraine with aura, intractable, without status migrainosus: Secondary | ICD-10-CM

## 2024-01-02 DIAGNOSIS — K219 Gastro-esophageal reflux disease without esophagitis: Secondary | ICD-10-CM | POA: Insufficient documentation

## 2024-01-02 DIAGNOSIS — M359 Systemic involvement of connective tissue, unspecified: Secondary | ICD-10-CM | POA: Insufficient documentation

## 2024-01-02 DIAGNOSIS — G43711 Chronic migraine without aura, intractable, with status migrainosus: Secondary | ICD-10-CM

## 2024-01-02 DIAGNOSIS — M2550 Pain in unspecified joint: Secondary | ICD-10-CM | POA: Insufficient documentation

## 2024-01-02 DIAGNOSIS — M797 Fibromyalgia: Secondary | ICD-10-CM | POA: Insufficient documentation

## 2024-01-02 DIAGNOSIS — M1990 Unspecified osteoarthritis, unspecified site: Secondary | ICD-10-CM | POA: Insufficient documentation

## 2024-01-02 MED ORDER — ONABOTULINUMTOXINA 200 UNITS IJ SOLR
155.0000 [IU] | Freq: Once | INTRAMUSCULAR | Status: AC
  Administered 2024-01-02 (×2): 155 [IU] via INTRAMUSCULAR

## 2024-01-02 MED ORDER — AJOVY 225 MG/1.5ML ~~LOC~~ SOAJ: 225.0000 mg | SUBCUTANEOUS | 3 refills | Status: DC

## 2024-01-02 NOTE — Progress Notes (Signed)
 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

## 2024-01-02 NOTE — Progress Notes (Signed)
 01/02/24 ALL: Zalaya returns for Botox . She is doing great. She has had 1 migraines over the past 12 weeks. Rizatriptan  usually works well.   10/02/2023 ALL: Marissa returns for Botox . This is third procedure. She reports migraines are well managed. She may have 1-2 migraine days per month. She continues Ajovy . Rizatriptan  usually helps. Dissolvable tablet not covered. Will send regular tablet. Recently diagnosed with fibromyalgia. Started on modafinil and duloxetine by PCP.   06/28/2023 ALL: Malee returns for Botox . First procedure 12/2022. She reports significant improvement in headaches after first procedure. She was doing great. Missed procedure 02/2023 but reports migraines remained well managed until the past 2-3 weeks. She feels weather changes have triggered more headaches. She tolerated last procedure well. She continues Ajovy  and rizatriptan .   12/21/2022 ALL: Shemica presents for first Botox  procedure. She continues Ajovy  and rizatriptan . She has 15 headache days with 8-9 migraines each month on current therapy.     Consent Form Botulism Toxin Injection For Chronic Migraine    Reviewed orally with patient, additionally signature is on file:  Botulism toxin has been approved by the Federal drug administration for treatment of chronic migraine. Botulism toxin does not cure chronic migraine and it may not be effective in some patients.  The administration of botulism toxin is accomplished by injecting a small amount of toxin into the muscles of the neck and head. Dosage must be titrated for each individual. Any benefits resulting from botulism toxin tend to wear off after 3 months with a repeat injection required if benefit is to be maintained. Injections are usually done every 3-4 months with maximum effect peak achieved by about 2 or 3 weeks. Botulism toxin is expensive and you should be sure of what costs you will incur resulting from the injection.  The side effects of botulism toxin  use for chronic migraine may include:   -Transient, and usually mild, facial weakness with facial injections  -Transient, and usually mild, head or neck weakness with head/neck injections  -Reduction or loss of forehead facial animation due to forehead muscle weakness  -Eyelid drooping  -Dry eye  -Pain at the site of injection or bruising at the site of injection  -Double vision  -Potential unknown long term risks   Contraindications: You should not have Botox  if you are pregnant, nursing, allergic to albumin, have an infection, skin condition, or muscle weakness at the site of the injection, or have myasthenia gravis, Lambert-Eaton syndrome, or ALS.  It is also possible that as with any injection, there may be an allergic reaction or no effect from the medication. Reduced effectiveness after repeated injections is sometimes seen and rarely infection at the injection site may occur. All care will be taken to prevent these side effects. If therapy is given over a long time, atrophy and wasting in the muscle injected may occur. Occasionally the patient's become refractory to treatment because they develop antibodies to the toxin. In this event, therapy needs to be modified.  I have read the above information and consent to the administration of botulism toxin.    BOTOX  PROCEDURE NOTE FOR MIGRAINE HEADACHE  Contraindications and precautions discussed with patient(above). Aseptic procedure was observed and patient tolerated procedure. Procedure performed by Greig Forbes, FNP-C.   The condition has existed for more than 6 months, and pt does not have a diagnosis of ALS, Myasthenia Gravis or Lambert-Eaton Syndrome.  Risks and benefits of injections discussed and pt agrees to proceed with the procedure.  Written  consent obtained  These injections are medically necessary. Pt  receives good benefits from these injections. These injections do not cause sedations or hallucinations which the oral  therapies may cause.   Description of procedure:  The patient was placed in a sitting position. The standard protocol was used for Botox  as follows, with 5 units of Botox  injected at each site:  -Procerus muscle, midline injection  -Corrugator muscle, bilateral injection  -Frontalis muscle, bilateral injection, with 2 sites each side, medial injection was performed in the upper one third of the frontalis muscle, in the region vertical from the medial inferior edge of the superior orbital rim. The lateral injection was again in the upper one third of the forehead vertically above the lateral limbus of the cornea, 1.5 cm lateral to the medial injection site.  -Temporalis muscle injection, 4 sites, bilaterally. The first injection was 3 cm above the tragus of the ear, second injection site was 1.5 cm to 3 cm up from the first injection site in line with the tragus of the ear. The third injection site was 1.5-3 cm forward between the first 2 injection sites. The fourth injection site was 1.5 cm posterior to the second injection site. 5th site laterally in the temporalis  muscleat the level of the outer canthus.  -Occipitalis muscle injection, 3 sites, bilaterally. The first injection was done one half way between the occipital protuberance and the tip of the mastoid process behind the ear. The second injection site was done lateral and superior to the first, 1 fingerbreadth from the first injection. The third injection site was 1 fingerbreadth superiorly and medially from the first injection site.  -Cervical paraspinal muscle injection, 2 sites, bilaterally. The first injection site was 1 cm from the midline of the cervical spine, 3 cm inferior to the lower border of the occipital protuberance. The second injection site was 1.5 cm superiorly and laterally to the first injection site.  -Trapezius muscle injection was performed at 3 sites, bilaterally. The first injection site was in the upper trapezius  muscle halfway between the inflection point of the neck, and the acromion. The second injection site was one half way between the acromion and the first injection site. The third injection was done between the first injection site and the inflection point of the neck.   Will return for repeat injection in 3 months.   A total of 200 units of Botox  was prepared, 155 units of Botox  was injected as documented above, any Botox  not injected was wasted. The patient tolerated the procedure well, there were no complications of the above procedure.

## 2024-01-15 ENCOUNTER — Telehealth: Admitting: Adult Health

## 2024-01-15 ENCOUNTER — Encounter: Payer: Self-pay | Admitting: Adult Health

## 2024-01-15 DIAGNOSIS — F411 Generalized anxiety disorder: Secondary | ICD-10-CM | POA: Diagnosis not present

## 2024-01-15 DIAGNOSIS — F902 Attention-deficit hyperactivity disorder, combined type: Secondary | ICD-10-CM

## 2024-01-15 DIAGNOSIS — F331 Major depressive disorder, recurrent, moderate: Secondary | ICD-10-CM | POA: Diagnosis not present

## 2024-01-15 MED ORDER — DULOXETINE HCL 60 MG PO CPEP: 60.0000 mg | ORAL_CAPSULE | Freq: Every day | ORAL | 1 refills | Status: DC

## 2024-01-15 MED ORDER — CLONAZEPAM 0.5 MG PO TABS: ORAL_TABLET | ORAL | 1 refills | Status: DC

## 2024-01-15 NOTE — Progress Notes (Signed)
 Misty Ross [id number] [birth year] 33 y.o.  Virtual Visit via Video Note  I connected with pt @ on 01/15/24 at 11:30 AM EDT by a video enabled telemedicine application and verified that I am speaking with the correct person using two identifiers.   I discussed the limitations of evaluation and management by telemedicine and the availability of in person appointments. The patient expressed understanding and agreed to proceed.  I discussed the assessment and treatment plan with the patient. The patient was provided an opportunity to ask questions and all were answered. The patient agreed with the plan and demonstrated an understanding of the instructions.   The patient was advised to call back or seek an in-person evaluation if the symptoms worsen or if the condition fails to improve as anticipated.  I provided 20 minutes of non-face-to-face time during this encounter.  The patient was located at home.  The provider was located at [facility].   Misty LOISE Sayers, Misty Ross   Subjective:   Patient ID:  Misty Ross is a 33 y.o. (DOB 11/11/1990) female.  Chief Complaint: No chief complaint on file.   HPI Misty Ross presents for follow-up of MDD, GAD and ADD.  Diagnosed with Fibromyalgia 6 months ago - increased pain - sleep issues.   Describes mood today as about the same. Pleasant. Denies tearfulness.  Mood symptoms - reports some depressive symptoms - not as bad as it was, but not 100%. Reports improved interest and motivation. Reports anxiety. Reports 2 recent panic attacks. Reports decreased irritability.   Reports worry, rumination and over thinking. Reports decreased obsessive thoughts and acts. Reports mood as getting better. Stating I feel like I doing better, but still struggling with the anxiety. Reports tolerating the Cymbalta  and is willing to increase the dose from 40mg  to 60mg  daily. Also, taking Clonazepam  as needed - most nights. Taking medications as prescribed.   Energy levels improving. Active, does not have a regular exercise routine - walking at job. Enjoys some usual interests and activities. Single. Currently living with parents. Spending time with family and friends.  Appetite adequate. Reports weight gain - fibromyalgia. Sleeps better some nights than others. Averages 8 hours. Reports using Clonazepam  some nights to help with sleep. Focus and concentration stable - it's getting better. Reports decreased brain fog associated Fibromyalgia. Completing tasks. Managing aspects of household. 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 Denies SI or HI.  Denies AH or VH. Denies self harm. Denies substance use x 2 months.  Previous medication trials:   Lexapro Wellbutrin  Zoloft Prozac  Clonazepam  Lamictal Modafinil Gabapentin Buspar Lamictal  Cymbalta  - recently started for fibromyalgia Trazadone  Review of Systems:  Review of Systems  Musculoskeletal:  Negative for gait problem.  Neurological:  Negative for tremors.  Psychiatric/Behavioral:         Please refer to HPI    Medications: I have reviewed the patient's current medications.  Current Outpatient Medications  Medication Sig Dispense Refill   DULoxetine  (CYMBALTA ) 60 MG capsule Take 1 capsule (60 mg total) by mouth daily. 30 capsule 1   clonazePAM  (KLONOPIN ) 0.5 MG tablet Take one tablet daily as needed for panic attacks. 30 tablet 1   diclofenac (VOLTAREN) 75 MG EC tablet Take 75 mg by mouth 2 (two) times daily as needed.     Fremanezumab -vfrm (AJOVY ) 225 MG/1.5ML SOAJ Inject 225 mg into the skin every 30 (thirty) days. 3 mL 3   metoprolol tartrate (LOPRESSOR) 50  MG tablet Take 75 mg by mouth 2 (two) times daily.     norethindrone (MICRONOR) 0.35 MG tablet Take 1 tablet by mouth daily.     ondansetron  (ZOFRAN -ODT) 4 MG disintegrating tablet TAKE 1-2 TABLETS (4-8 MG TOTAL) BY MOUTH EVERY 8 (EIGHT) HOURS AS NEEDED. GREAT FOR NAUSEA OR MOTION SICKNESS. CAN  ALSO TAKE IT WITH RIZATRIPTAN  OR NURTEC FOR MIGRAINES 30 tablet 3   pantoprazole  (PROTONIX ) 40 MG tablet Take 40 mg by mouth every morning.     rizatriptan  (MAXALT ) 10 MG tablet Take 1 tablet (10 mg total) by mouth as needed for migraine. May repeat in 2 hours if needed 10 tablet 11   solifenacin (VESICARE) 5 MG tablet Take 5 mg by mouth daily.     Vitamin D, Ergocalciferol, (DRISDOL) 1.25 MG (50000 UNIT) CAPS capsule Take 50,000 Units by mouth once a week.     No current facility-administered medications for this visit.    Medication Side Effects: None  Allergies:  Allergies  Allergen Reactions   Benadryl  [Diphenhydramine ]     Feels like skin is crawling    Past Medical History:  Diagnosis Date   ADHD (attention deficit hyperactivity disorder)    Allergy    Anxiety    Depression    Eczema    Fibromyalgia    Hypertension    Migraine    Seizures (HCC)     Family History  Problem Relation Age of Onset   Hyperlipidemia Father    Migraines Father    Multiple sclerosis Brother    Cancer Maternal Grandmother    Diabetes Maternal Grandmother    Cancer Maternal Grandfather    Heart disease Paternal Grandmother    Cancer Paternal Grandfather     Social History   Socioeconomic History   Marital status: Single    Spouse name: Not on file   Number of children: Not on file   Years of education: Not on file   Highest education level: Not on file  Occupational History   Not on file  Tobacco Use   Smoking status: Former    Current packs/day: 0.50    Types: Cigarettes   Smokeless tobacco: Never  Vaping Use   Vaping status: Every Day   Substances: Nicotine   Substance and Sexual Activity   Alcohol use: Not Currently    Comment: one beer per day   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   Sexual activity: Not on file  Other Topics Concern   Not on file  Social History Narrative   Right handed   Caffeine: 1-2 cups per day    Lives with her family   Social  Drivers of Corporate investment banker Strain: Not on file  Food Insecurity: Not on file  Transportation Needs: Not on file  Physical Activity: Not on file  Stress: Not on file  Social Connections: Not on file  Intimate Partner Violence: Not At Risk (03/31/2023)   Received from [facility]   HITS    Over the last 12 months how often did your partner physically hurt you?: Never    Over the last 12 months how often did your partner insult you or talk down to you?: Never    Over the last 12 months how often did your partner threaten you with physical harm?: Never    Over the last 12 months how often did your partner scream or curse at you?: Never    Past Medical History, Surgical history,  Social history, and Family history were reviewed and updated as appropriate.   Please see review of systems for further details on the patient's review from today.   Objective:   Physical Exam:  There were no vitals taken for this visit.  Physical Exam Constitutional:      General: She is not in acute distress. Musculoskeletal:        General: No deformity.  Neurological:     Mental Status: She is alert and oriented to person, place, and time.     Coordination: Coordination normal.  Psychiatric:        Attention and Perception: Attention and perception normal. She does not perceive auditory or visual hallucinations.        Mood and Affect: Mood normal. Mood is not anxious or depressed. Affect is not labile, blunt, angry or inappropriate.        Speech: Speech normal.        Behavior: Behavior normal.        Thought Content: Thought content normal. Thought content is not paranoid or delusional. Thought content does not include homicidal or suicidal ideation. Thought content does not include homicidal or suicidal plan.        Cognition and Memory: Cognition and memory normal.        Judgment: Judgment normal.     Comments: Insight intact     Lab Review:     Component Value Date/Time   NA  138 02/17/2023 1333   K 4.2 02/17/2023 1333   CL 105 02/17/2023 1333   CO2 28 02/17/2023 1333   GLUCOSE 98 02/17/2023 1333   BUN 15 02/17/2023 1333   CREATININE 1.00 02/17/2023 1333   CALCIUM 8.7 (L) 02/17/2023 1333   PROT 5.3 (L) 02/17/2023 1333   ALBUMIN 3.5 02/17/2023 1333   AST 36 02/17/2023 1333   ALT 28 02/17/2023 1333   ALKPHOS 30 (L) 02/17/2023 1333   BILITOT 0.3 02/17/2023 1333   GFRNONAA >60 02/17/2023 1333   GFRAA >90 07/15/2014 2228       Component Value Date/Time   WBC 4.8 02/17/2023 1333   RBC 3.87 02/17/2023 1333   HGB 11.3 (L) 02/17/2023 1333   HCT 34.0 (L) 02/17/2023 1333   PLT 242 02/17/2023 1333   MCV 87.9 02/17/2023 1333   MCH 29.2 02/17/2023 1333   MCHC 33.2 02/17/2023 1333   RDW 12.2 02/17/2023 1333   LYMPHSABS 1.4 02/17/2023 1333   MONOABS 0.6 02/17/2023 1333   EOSABS 0.2 02/17/2023 1333   BASOSABS 0.0 02/17/2023 1333    No results found for: POCLITH, LITHIUM   No results found for: PHENYTOIN, PHENOBARB, VALPROATE, CBMZ   .res Assessment: Plan:    Treatment Plan/Recommendations:   Plan:  PDMP reviewed  Increase Cymbalta  40mg  to 60mg  daily.  Clonazepam  0.5mg  daily as needed for sleep and anxiety attacks.Has tried multiple medications and feels the Clonazepam  works well.  20 minutes spent dedicated to the care of this patient on the date of this encounter to include pre-visit review of records, ordering of medication, post visit documentation, and face-to-face time with the patient discussing depression, anxiety and ADD. Discussed continuing current medication regimen.  RTC 4 weeks  Patient advised to contact office with any questions, adverse effects, or acute worsening in signs and symptoms.  Diagnoses and all orders for this visit:  Major depressive disorder, recurrent episode, moderate (HCC) -     clonazePAM  (KLONOPIN ) 0.5 MG tablet; Take one tablet daily as needed for panic attacks. -  DULoxetine  (CYMBALTA ) 60 MG  capsule; Take 1 capsule (60 mg total) by mouth daily.  Generalized anxiety disorder -     clonazePAM  (KLONOPIN ) 0.5 MG tablet; Take one tablet daily as needed for panic attacks. -     DULoxetine  (CYMBALTA ) 60 MG capsule; Take 1 capsule (60 mg total) by mouth daily.  Attention deficit hyperactivity disorder (ADHD), combined type     Please see After Visit Summary for patient specific instructions.  Future Appointments  Date Time Provider Department Center  04/01/2024 11:30 AM Lomax, Amy, Misty Ross [facility] None    No orders of the defined types were placed in this encounter.     -------------------------------

## 2024-01-16 DIAGNOSIS — J069 Acute upper respiratory infection, unspecified: Secondary | ICD-10-CM | POA: Diagnosis not present

## 2024-02-12 ENCOUNTER — Encounter: Payer: Self-pay | Admitting: Adult Health

## 2024-02-12 ENCOUNTER — Telehealth (INDEPENDENT_AMBULATORY_CARE_PROVIDER_SITE_OTHER): Payer: Self-pay | Admitting: Adult Health

## 2024-02-12 DIAGNOSIS — Z0389 Encounter for observation for other suspected diseases and conditions ruled out: Secondary | ICD-10-CM

## 2024-02-12 NOTE — Progress Notes (Signed)
 Called and LVM for patient to R/S. Attempted to reach patient via video and telephone today. LVM for pt to call and r/s.

## 2024-02-13 ENCOUNTER — Other Ambulatory Visit: Payer: Self-pay | Admitting: Adult Health

## 2024-02-13 DIAGNOSIS — F331 Major depressive disorder, recurrent, moderate: Secondary | ICD-10-CM

## 2024-02-13 DIAGNOSIS — F411 Generalized anxiety disorder: Secondary | ICD-10-CM

## 2024-02-21 ENCOUNTER — Telehealth (INDEPENDENT_AMBULATORY_CARE_PROVIDER_SITE_OTHER): Admitting: Adult Health

## 2024-02-21 ENCOUNTER — Encounter: Payer: Self-pay | Admitting: Adult Health

## 2024-02-21 DIAGNOSIS — F411 Generalized anxiety disorder: Secondary | ICD-10-CM

## 2024-02-21 DIAGNOSIS — F902 Attention-deficit hyperactivity disorder, combined type: Secondary | ICD-10-CM

## 2024-02-21 DIAGNOSIS — F909 Attention-deficit hyperactivity disorder, unspecified type: Secondary | ICD-10-CM

## 2024-02-21 DIAGNOSIS — F331 Major depressive disorder, recurrent, moderate: Secondary | ICD-10-CM

## 2024-02-21 MED ORDER — DULOXETINE HCL 60 MG PO CPEP: 60.0000 mg | ORAL_CAPSULE | Freq: Every day | ORAL | 2 refills | Status: DC

## 2024-02-21 MED ORDER — DULOXETINE HCL 30 MG PO CPEP: 30.0000 mg | ORAL_CAPSULE | Freq: Every day | ORAL | 2 refills | Status: DC

## 2024-02-21 MED ORDER — CLONAZEPAM 0.5 MG PO TABS: ORAL_TABLET | ORAL | 2 refills | Status: DC

## 2024-02-21 NOTE — Progress Notes (Signed)
 Misty Ross [id number] 1991/01/27 33 y.o.  Virtual Visit via Video Note  I connected with pt @ on 02/21/24 at 10:00 AM EDT by a video enabled telemedicine application and verified that I am speaking with the correct person using two identifiers.   I discussed the limitations of evaluation and management by telemedicine and the availability of in person appointments. The patient expressed understanding and agreed to proceed.  I discussed the assessment and treatment plan with the patient. The patient was provided an opportunity to ask questions and all were answered. The patient agreed with the plan and demonstrated an understanding of the instructions.   The patient was advised to call back or seek an in-person evaluation if the symptoms worsen or if the condition fails to improve as anticipated.  I provided 15 minutes of non-face-to-face time during this encounter.  The patient was located at home.  The provider was located at [facility].   Angeline LOISE Sayers, NP   Subjective:   Patient ID:  Misty Ross is a 33 y.o. (DOB Apr 21, 1991) female.  Chief Complaint: No chief complaint on file.   HPI Misty Ross presents for follow-up of  MDD, GAD and ADD.  Diagnosed with Fibromyalgia 6 months ago - increased pain - sleep issues.   Describes mood today as about the same. Pleasant. Denies tearfulness.  Mood symptoms - denies depressive symptoms. Reports improved interest and motivation. Reports anxiety sometimes. Reports one recent panic attacks. Reports increased irritability - working a lot lately - maybe I'm tired. Reports some worry, rumination and over thinking. Denies obsessive thoughts and acts. Reports mood as stable. Stating I feel like I doing good. Reports the increase in the Cymbalta  has been helpful, but would like to increase the dose. Also, taking Clonazepam  as needed. Taking medications as prescribed.  Energy levels improving. Active, does not have a regular  exercise routine - walking at job. Enjoys some usual interests and activities. Single. Currently living with parents. Spending time with family and friends.  Appetite adequate. Reports weight loss - fibromyalgia. Sleeps better some nights than others. Averages 8 hours. Reports using Clonazepam  some nights to help with sleep. Focus and concentration stable - it's been good. Reports decreased brain fog associated with Fibromyalgia - not as bad. Completing tasks. Managing aspects of household. 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 Denies SI or HI.  Denies AH or VH. Denies self harm. Denies substance use x 2 months.  Previous medication trials:   Lexapro Wellbutrin  Zoloft Prozac  Clonazepam  Lamictal Modafinil Gabapentin Buspar Lamictal  Cymbalta  - recently started for fibromyalgia Trazadone   Review of Systems:  Review of Systems  Musculoskeletal:  Negative for gait problem.  Neurological:  Negative for tremors.  Psychiatric/Behavioral:         Please refer to HPI    Medications: I have reviewed the patient's current medications.  Current Outpatient Medications  Medication Sig Dispense Refill   clonazePAM  (KLONOPIN ) 0.5 MG tablet Take one tablet daily as needed for panic attacks. 30 tablet 1   diclofenac (VOLTAREN) 75 MG EC tablet Take 75 mg by mouth 2 (two) times daily as needed.     DULoxetine  (CYMBALTA ) 60 MG capsule Take 1 capsule (60 mg total) by mouth daily. 30 capsule 1   Fremanezumab -vfrm (AJOVY ) 225 MG/1.5ML SOAJ Inject 225 mg into the skin every 30 (thirty) days. 3 mL 3   metoprolol tartrate (LOPRESSOR) 50 MG tablet Take 75 mg by  mouth 2 (two) times daily.     norethindrone (MICRONOR) 0.35 MG tablet Take 1 tablet by mouth daily.     ondansetron  (ZOFRAN -ODT) 4 MG disintegrating tablet TAKE 1-2 TABLETS (4-8 MG TOTAL) BY MOUTH EVERY 8 (EIGHT) HOURS AS NEEDED. GREAT FOR NAUSEA OR MOTION SICKNESS. CAN ALSO TAKE IT WITH RIZATRIPTAN  OR NURTEC FOR  MIGRAINES 30 tablet 3   pantoprazole  (PROTONIX ) 40 MG tablet Take 40 mg by mouth every morning.     rizatriptan  (MAXALT ) 10 MG tablet Take 1 tablet (10 mg total) by mouth as needed for migraine. May repeat in 2 hours if needed 10 tablet 11   solifenacin (VESICARE) 5 MG tablet Take 5 mg by mouth daily.     Vitamin D, Ergocalciferol, (DRISDOL) 1.25 MG (50000 UNIT) CAPS capsule Take 50,000 Units by mouth once a week.     No current facility-administered medications for this visit.    Medication Side Effects: None  Allergies:  Allergies  Allergen Reactions   Benadryl  [Diphenhydramine ]     Feels like skin is crawling    Past Medical History:  Diagnosis Date   ADHD (attention deficit hyperactivity disorder)    Allergy    Anxiety    Depression    Eczema    Fibromyalgia    Hypertension    Migraine    Seizures (HCC)     Family History  Problem Relation Age of Onset   Hyperlipidemia Father    Migraines Father    Multiple sclerosis Brother    Cancer Maternal Grandmother    Diabetes Maternal Grandmother    Cancer Maternal Grandfather    Heart disease Paternal Grandmother    Cancer Paternal Grandfather     Social History   Socioeconomic History   Marital status: Single    Spouse name: Not on file   Number of children: Not on file   Years of education: Not on file   Highest education level: Not on file  Occupational History   Not on file  Tobacco Use   Smoking status: Former    Current packs/day: 0.50    Types: Cigarettes   Smokeless tobacco: Never  Vaping Use   Vaping status: Every Day   Substances: Nicotine   Substance and Sexual Activity   Alcohol use: Not Currently    Comment: one beer per day   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   Sexual activity: Not on file  Other Topics Concern   Not on file  Social History Narrative   Right handed   Caffeine: 1-2 cups per day    Lives with her family   Social Drivers of Corporate investment banker Strain:  Not on file  Food Insecurity: Not on file  Transportation Needs: Not on file  Physical Activity: Not on file  Stress: Not on file  Social Connections: Not on file  Intimate Partner Violence: Not At Risk (03/31/2023)   Received from [facility]   HITS    Over the last 12 months how often did your partner physically hurt you?: Never    Over the last 12 months how often did your partner insult you or talk down to you?: Never    Over the last 12 months how often did your partner threaten you with physical harm?: Never    Over the last 12 months how often did your partner scream or curse at you?: Never    Past Medical History, Surgical history, Social history, and Family history were  reviewed and updated as appropriate.   Please see review of systems for further details on the patient's review from today.   Objective:   Physical Exam:  There were no vitals taken for this visit.  Physical Exam Constitutional:      General: She is not in acute distress. Musculoskeletal:        General: No deformity.  Neurological:     Mental Status: She is alert and oriented to person, place, and time.     Coordination: Coordination normal.  Psychiatric:        Attention and Perception: Attention and perception normal. She does not perceive auditory or visual hallucinations.        Mood and Affect: Mood normal. Mood is not anxious or depressed. Affect is not labile, blunt, angry or inappropriate.        Speech: Speech normal.        Behavior: Behavior normal.        Thought Content: Thought content normal. Thought content is not paranoid or delusional. Thought content does not include homicidal or suicidal ideation. Thought content does not include homicidal or suicidal plan.        Cognition and Memory: Cognition and memory normal.        Judgment: Judgment normal.     Comments: Insight intact     Lab Review:     Component Value Date/Time   NA 138 02/17/2023 1333   K 4.2 02/17/2023 1333    CL 105 02/17/2023 1333   CO2 28 02/17/2023 1333   GLUCOSE 98 02/17/2023 1333   BUN 15 02/17/2023 1333   CREATININE 1.00 02/17/2023 1333   CALCIUM 8.7 (L) 02/17/2023 1333   PROT 5.3 (L) 02/17/2023 1333   ALBUMIN 3.5 02/17/2023 1333   AST 36 02/17/2023 1333   ALT 28 02/17/2023 1333   ALKPHOS 30 (L) 02/17/2023 1333   BILITOT 0.3 02/17/2023 1333   GFRNONAA >60 02/17/2023 1333   GFRAA >90 07/15/2014 2228       Component Value Date/Time   WBC 4.8 02/17/2023 1333   RBC 3.87 02/17/2023 1333   HGB 11.3 (L) 02/17/2023 1333   HCT 34.0 (L) 02/17/2023 1333   PLT 242 02/17/2023 1333   MCV 87.9 02/17/2023 1333   MCH 29.2 02/17/2023 1333   MCHC 33.2 02/17/2023 1333   RDW 12.2 02/17/2023 1333   LYMPHSABS 1.4 02/17/2023 1333   MONOABS 0.6 02/17/2023 1333   EOSABS 0.2 02/17/2023 1333   BASOSABS 0.0 02/17/2023 1333    No results found for: POCLITH, LITHIUM   No results found for: PHENYTOIN, PHENOBARB, VALPROATE, CBMZ   .res Assessment: Plan:    Treatment Plan/Recommendations:   Plan:  PDMP reviewed  Continue Cymbalta  60mg  daily.  Add Cymbalta  30mg  daily  Clonazepam  0.5mg  daily as needed for sleep and anxiety attacks. Has tried multiple medications and feels the Clonazepam  works well.  20 minutes spent dedicated to the care of this patient on the date of this encounter to include pre-visit review of records, ordering of medication, post visit documentation, and face-to-face time with the patient discussing depression, anxiety and ADD. Discussed continuing current medication regimen.  RTC 4 weeks  Patient advised to contact office with any questions, adverse effects, or acute worsening in signs and symptoms.  There are no diagnoses linked to this encounter.   Please see After Visit Summary for patient specific instructions.  Future Appointments  Date Time Provider Department Center  02/21/2024 10:00 AM Loralee Weitzman Nattalie, NP [facility]  None  04/01/2024 11:30 AM  Lomax, Amy, NP [facility] None    No orders of the defined types were placed in this encounter.     -------------------------------

## 2024-02-25 DIAGNOSIS — I10 Essential (primary) hypertension: Secondary | ICD-10-CM | POA: Diagnosis not present

## 2024-02-25 DIAGNOSIS — E559 Vitamin D deficiency, unspecified: Secondary | ICD-10-CM | POA: Diagnosis not present

## 2024-02-25 DIAGNOSIS — E8889 Other specified metabolic disorders: Secondary | ICD-10-CM | POA: Diagnosis not present

## 2024-02-25 DIAGNOSIS — G43119 Migraine with aura, intractable, without status migrainosus: Secondary | ICD-10-CM | POA: Diagnosis not present

## 2024-02-25 DIAGNOSIS — K219 Gastro-esophageal reflux disease without esophagitis: Secondary | ICD-10-CM | POA: Diagnosis not present

## 2024-02-25 DIAGNOSIS — Z1331 Encounter for screening for depression: Secondary | ICD-10-CM | POA: Diagnosis not present

## 2024-02-25 DIAGNOSIS — R7303 Prediabetes: Secondary | ICD-10-CM | POA: Diagnosis not present

## 2024-02-25 DIAGNOSIS — Z79899 Other long term (current) drug therapy: Secondary | ICD-10-CM | POA: Diagnosis not present

## 2024-02-25 DIAGNOSIS — M797 Fibromyalgia: Secondary | ICD-10-CM | POA: Diagnosis not present

## 2024-02-25 DIAGNOSIS — Z131 Encounter for screening for diabetes mellitus: Secondary | ICD-10-CM | POA: Diagnosis not present

## 2024-02-25 DIAGNOSIS — Z9189 Other specified personal risk factors, not elsewhere classified: Secondary | ICD-10-CM | POA: Diagnosis not present

## 2024-03-04 DIAGNOSIS — Z01419 Encounter for gynecological examination (general) (routine) without abnormal findings: Secondary | ICD-10-CM | POA: Diagnosis not present

## 2024-03-04 DIAGNOSIS — Z6831 Body mass index (BMI) 31.0-31.9, adult: Secondary | ICD-10-CM | POA: Diagnosis not present

## 2024-03-05 ENCOUNTER — Telehealth: Payer: Self-pay | Admitting: Family Medicine

## 2024-03-05 DIAGNOSIS — G43E19 Chronic migraine with aura, intractable, without status migrainosus: Secondary | ICD-10-CM

## 2024-03-05 NOTE — Telephone Encounter (Signed)
 Faxed signed PA form and notes to [facility] @ [phone].

## 2024-03-10 DIAGNOSIS — M797 Fibromyalgia: Secondary | ICD-10-CM | POA: Diagnosis not present

## 2024-03-10 DIAGNOSIS — R7303 Prediabetes: Secondary | ICD-10-CM | POA: Diagnosis not present

## 2024-03-10 DIAGNOSIS — G43119 Migraine with aura, intractable, without status migrainosus: Secondary | ICD-10-CM | POA: Diagnosis not present

## 2024-03-10 DIAGNOSIS — I10 Essential (primary) hypertension: Secondary | ICD-10-CM | POA: Diagnosis not present

## 2024-03-15 ENCOUNTER — Other Ambulatory Visit: Payer: Self-pay | Admitting: Adult Health

## 2024-03-16 ENCOUNTER — Other Ambulatory Visit: Payer: Self-pay

## 2024-03-17 ENCOUNTER — Encounter: Payer: Self-pay | Admitting: Adult Health

## 2024-03-17 ENCOUNTER — Telehealth (INDEPENDENT_AMBULATORY_CARE_PROVIDER_SITE_OTHER): Admitting: Adult Health

## 2024-03-17 DIAGNOSIS — F331 Major depressive disorder, recurrent, moderate: Secondary | ICD-10-CM

## 2024-03-17 DIAGNOSIS — F411 Generalized anxiety disorder: Secondary | ICD-10-CM

## 2024-03-17 DIAGNOSIS — F909 Attention-deficit hyperactivity disorder, unspecified type: Secondary | ICD-10-CM

## 2024-03-17 DIAGNOSIS — F902 Attention-deficit hyperactivity disorder, combined type: Secondary | ICD-10-CM

## 2024-03-17 NOTE — Progress Notes (Signed)
 Misty Ross [id number] [birth year] 33 y.o.  Virtual Visit via Video Note  I connected with pt @ on 03/17/24 at 10:00 AM EDT by a video enabled telemedicine application and verified that I am speaking with the correct person using two identifiers.   I discussed the limitations of evaluation and management by telemedicine and the availability of in person appointments. The patient expressed understanding and agreed to proceed.  I discussed the assessment and treatment plan with the patient. The patient was provided an opportunity to ask questions and all were answered. The patient agreed with the plan and demonstrated an understanding of the instructions.   The patient was advised to call back or seek an in-person evaluation if the symptoms worsen or if the condition fails to improve as anticipated.  I provided 20 minutes of non-face-to-face time during this encounter.  The patient was located at home.  The provider was located at [facility].   Misty LOISE Sayers, NP   Subjective:   Patient ID:  Misty Ross is a 33 y.o. (DOB 08-21-1990) female.  Chief Complaint: No chief complaint on file.   HPI Misty Ross presents for follow-up of MDD, GAD and ADD.  Diagnosed with Fibromyalgia 6 months ago - increased pain - sleep issues.   Describes mood today as better. Pleasant. Denies tearfulness.  Mood symptoms - denies depressive symptoms. Reports improved interest and motivation. Reports decreased anxiety. Denies recent panic attacks. Reports decreased irritability. Denies worry, rumination and over thinking. Denies obsessive thoughts and acts. Reports mood as stable. Stating I feel like I've been pretty stable. Reports the increase in the Cymbalta  has been helpful. Also, taking Clonazepam  at bedtime as needed. Taking medications as prescribed.  Energy levels improving. Active, does not have a regular exercise routine - walking at job - working double shifts. Enjoys some usual  interests and activities. Single. Currently living with parents. Spending time with family and friends.  Appetite adequate. Reports weight gain over the past few years - recently diagnosed with pre-diabetes. Also suffering with fibromyalgia. Sleeps better some nights than others. Averages 8 hours. Reports using Clonazepam  some nights to help with sleep. Focus and concentration stable - it's been good. Reports decreased brain fog associated with Fibromyalgia - not as bad. Completing tasks. Managing aspects of household. 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 Denies SI or HI.  Denies AH or VH. Denies self harm. Denies substance use x 2 months.  Previous medication trials:   Lexapro Wellbutrin  Zoloft Prozac  Clonazepam  Lamictal Modafinil Gabapentin Buspar Lamictal  Cymbalta  - recently started for fibromyalgia Trazadone   Review of Systems:  Review of Systems  Musculoskeletal:  Negative for gait problem.  Neurological:  Negative for tremors.  Psychiatric/Behavioral:         Please refer to HPI    Medications: I have reviewed the patient's current medications.  Current Outpatient Medications  Medication Sig Dispense Refill   clonazePAM  (KLONOPIN ) 0.5 MG tablet Take one tablet daily as needed for panic attacks. 30 tablet 2   diclofenac (VOLTAREN) 75 MG EC tablet Take 75 mg by mouth 2 (two) times daily as needed.     DULoxetine  (CYMBALTA ) 30 MG capsule Take 1 capsule (30 mg total) by mouth daily. 30 capsule 2   DULoxetine  (CYMBALTA ) 60 MG capsule Take 1 capsule (60 mg total) by mouth daily. 30 capsule 2   Fremanezumab -vfrm (AJOVY ) 225 MG/1.5ML SOAJ Inject 225 mg into the skin every  30 (thirty) days. 3 mL 3   metoprolol tartrate (LOPRESSOR) 50 MG tablet Take 75 mg by mouth 2 (two) times daily.     norethindrone (MICRONOR) 0.35 MG tablet Take 1 tablet by mouth daily.     ondansetron  (ZOFRAN -ODT) 4 MG disintegrating tablet TAKE 1-2 TABLETS (4-8  MG TOTAL) BY MOUTH EVERY 8 (EIGHT) HOURS AS NEEDED. GREAT FOR NAUSEA OR MOTION SICKNESS. CAN ALSO TAKE IT WITH RIZATRIPTAN  OR NURTEC FOR MIGRAINES 30 tablet 3   pantoprazole  (PROTONIX ) 40 MG tablet Take 40 mg by mouth every morning.     rizatriptan  (MAXALT ) 10 MG tablet Take 1 tablet (10 mg total) by mouth as needed for migraine. May repeat in 2 hours if needed 10 tablet 11   solifenacin (VESICARE) 5 MG tablet Take 5 mg by mouth daily.     Vitamin D, Ergocalciferol, (DRISDOL) 1.25 MG (50000 UNIT) CAPS capsule Take 50,000 Units by mouth once a week.     No current facility-administered medications for this visit.    Medication Side Effects: None  Allergies:  Allergies  Allergen Reactions   Benadryl  [Diphenhydramine ]     Feels like skin is crawling    Past Medical History:  Diagnosis Date   ADHD (attention deficit hyperactivity disorder)    Allergy    Anxiety    Depression    Eczema    Fibromyalgia    Hypertension    Migraine    Seizures (HCC)     Family History  Problem Relation Age of Onset   Hyperlipidemia Father    Migraines Father    Multiple sclerosis Brother    Cancer Maternal Grandmother    Diabetes Maternal Grandmother    Cancer Maternal Grandfather    Heart disease Paternal Grandmother    Cancer Paternal Grandfather     Social History   Socioeconomic History   Marital status: Single    Spouse name: Not on file   Number of children: Not on file   Years of education: Not on file   Highest education level: Not on file  Occupational History   Not on file  Tobacco Use   Smoking status: Former    Current packs/day: 0.50    Types: Cigarettes   Smokeless tobacco: Never  Vaping Use   Vaping status: Every Day   Substances: Nicotine   Substance and Sexual Activity   Alcohol use: Not Currently    Comment: one beer per day   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   Sexual activity: Not on file  Other Topics Concern   Not on file  Social History  Narrative   Right handed   Caffeine: 1-2 cups per day    Lives with her family   Social Drivers of Corporate Investment Banker Strain: Not on file  Food Insecurity: Not on file  Transportation Needs: Not on file  Physical Activity: Not on file  Stress: Not on file  Social Connections: Not on file  Intimate Partner Violence: Not At Risk (03/31/2023)   Received from [facility]   HITS    Over the last 12 months how often did your partner physically hurt you?: Never    Over the last 12 months how often did your partner insult you or talk down to you?: Never    Over the last 12 months how often did your partner threaten you with physical harm?: Never    Over the last 12 months how often did your partner scream or  curse at you?: Never    Past Medical History, Surgical history, Social history, and Family history were reviewed and updated as appropriate.   Please see review of systems for further details on the patient's review from today.   Objective:   Physical Exam:  There were no vitals taken for this visit.  Physical Exam Constitutional:      General: She is not in acute distress. Musculoskeletal:        General: No deformity.  Neurological:     Mental Status: She is alert and oriented to person, place, and time.     Coordination: Coordination normal.  Psychiatric:        Attention and Perception: Attention and perception normal. She does not perceive auditory or visual hallucinations.        Mood and Affect: Mood normal. Mood is not anxious or depressed. Affect is not labile, blunt, angry or inappropriate.        Speech: Speech normal.        Behavior: Behavior normal.        Thought Content: Thought content normal. Thought content is not paranoid or delusional. Thought content does not include homicidal or suicidal ideation. Thought content does not include homicidal or suicidal plan.        Cognition and Memory: Cognition and memory normal.        Judgment: Judgment  normal.     Comments: Insight intact     Lab Review:     Component Value Date/Time   NA 138 02/17/2023 1333   K 4.2 02/17/2023 1333   CL 105 02/17/2023 1333   CO2 28 02/17/2023 1333   GLUCOSE 98 02/17/2023 1333   BUN 15 02/17/2023 1333   CREATININE 1.00 02/17/2023 1333   CALCIUM 8.7 (L) 02/17/2023 1333   PROT 5.3 (L) 02/17/2023 1333   ALBUMIN 3.5 02/17/2023 1333   AST 36 02/17/2023 1333   ALT 28 02/17/2023 1333   ALKPHOS 30 (L) 02/17/2023 1333   BILITOT 0.3 02/17/2023 1333   GFRNONAA >60 02/17/2023 1333   GFRAA >90 07/15/2014 2228       Component Value Date/Time   WBC 4.8 02/17/2023 1333   RBC 3.87 02/17/2023 1333   HGB 11.3 (L) 02/17/2023 1333   HCT 34.0 (L) 02/17/2023 1333   PLT 242 02/17/2023 1333   MCV 87.9 02/17/2023 1333   MCH 29.2 02/17/2023 1333   MCHC 33.2 02/17/2023 1333   RDW 12.2 02/17/2023 1333   LYMPHSABS 1.4 02/17/2023 1333   MONOABS 0.6 02/17/2023 1333   EOSABS 0.2 02/17/2023 1333   BASOSABS 0.0 02/17/2023 1333    No results found for: POCLITH, LITHIUM   No results found for: PHENYTOIN, PHENOBARB, VALPROATE, CBMZ   .res Assessment: Plan:    Treatment Plan/Recommendations:   Plan:  PDMP reviewed  Cymbalta  60mg  daily. Cymbalta  30mg  daily  Clonazepam  0.5mg  daily as needed for sleep and anxiety attacks. Has tried multiple medications and feels the Clonazepam  works well.  20 minutes spent dedicated to the care of this patient on the date of this encounter to include pre-visit review of records, ordering of medication, post visit documentation, and face-to-face time with the patient discussing depression, anxiety and ADD. Discussed continuing current medication regimen.  RTC 3 months  Patient advised to contact office with any questions, adverse effects, or acute worsening in signs and symptoms.  There are no diagnoses linked to this encounter.   Please see After Visit Summary for patient specific instructions.  Future  Appointments  Date Time Provider Department Center  03/17/2024 10:00 AM Sachiko Methot, Misty Mattocks, NP [facility] None  04/01/2024 11:30 AM Lomax, Amy, NP [facility] None    No orders of the defined types were placed in this encounter.     -------------------------------

## 2024-03-20 ENCOUNTER — Encounter: Payer: Self-pay | Admitting: Family Medicine

## 2024-03-24 DIAGNOSIS — R7303 Prediabetes: Secondary | ICD-10-CM | POA: Diagnosis not present

## 2024-03-24 DIAGNOSIS — G43119 Migraine with aura, intractable, without status migrainosus: Secondary | ICD-10-CM | POA: Diagnosis not present

## 2024-03-24 DIAGNOSIS — M797 Fibromyalgia: Secondary | ICD-10-CM | POA: Diagnosis not present

## 2024-03-24 DIAGNOSIS — I10 Essential (primary) hypertension: Secondary | ICD-10-CM | POA: Diagnosis not present

## 2024-03-24 MED ORDER — ONABOTULINUMTOXINA 200 UNITS IJ SOLR: INTRAMUSCULAR | 2 refills | Status: AC

## 2024-03-24 NOTE — Telephone Encounter (Signed)
 Rx sent in

## 2024-03-24 NOTE — Addendum Note (Signed)
 Addended by: JOSHUA MAURILIO CROME on: 03/24/2024 08:51 AM   Modules accepted: Orders

## 2024-03-24 NOTE — Telephone Encounter (Signed)
 Auth was initially denied, but I asked for an appeal and they overturned the decision. Please send rx to [facility] in [place], thanks!  Auth#: [id number] (03/15/24-02/04/25)

## 2024-03-26 NOTE — Progress Notes (Signed)
 03/31/24 ALL: Chesnie returns for Botox . She is doing great. She has had 1-2 migraines over the past 12 weeks. Rizatriptan  usually works well.   01/02/2024 ALL: Misty Ross returns for Botox . She is doing great. She has had 1 migraines over the past 12 weeks. Rizatriptan  usually works well.   10/02/2023 ALL: Makaylah returns for Botox . This is third procedure. She reports migraines are well managed. She may have 1-2 migraine days per month. She continues Ajovy . Rizatriptan  usually helps. Dissolvable tablet not covered. Will send regular tablet. Recently diagnosed with fibromyalgia. Started on modafinil and duloxetine  by PCP.   06/28/2023 ALL: Kellyn returns for Botox . First procedure 12/2022. She reports significant improvement in headaches after first procedure. She was doing great. Missed procedure 02/2023 but reports migraines remained well managed until the past 2-3 weeks. She feels weather changes have triggered more headaches. She tolerated last procedure well. She continues Ajovy  and rizatriptan .   12/21/2022 ALL: Jessie presents for first Botox  procedure. She continues Ajovy  and rizatriptan . She has 15 headache days with 8-9 migraines each month on current therapy.     Consent Form Botulism Toxin Injection For Chronic Migraine    Reviewed orally with patient, additionally signature is on file:  Botulism toxin has been approved by the Federal drug administration for treatment of chronic migraine. Botulism toxin does not cure chronic migraine and it may not be effective in some patients.  The administration of botulism toxin is accomplished by injecting a small amount of toxin into the muscles of the neck and head. Dosage must be titrated for each individual. Any benefits resulting from botulism toxin tend to wear off after 3 months with a repeat injection required if benefit is to be maintained. Injections are usually done every 3-4 months with maximum effect peak achieved by about 2 or 3 weeks.  Botulism toxin is expensive and you should be sure of what costs you will incur resulting from the injection.  The side effects of botulism toxin use for chronic migraine may include:   -Transient, and usually mild, facial weakness with facial injections  -Transient, and usually mild, head or neck weakness with head/neck injections  -Reduction or loss of forehead facial animation due to forehead muscle weakness  -Eyelid drooping  -Dry eye  -Pain at the site of injection or bruising at the site of injection  -Double vision  -Potential unknown long term risks   Contraindications: You should not have Botox  if you are pregnant, nursing, allergic to albumin, have an infection, skin condition, or muscle weakness at the site of the injection, or have myasthenia gravis, Lambert-Eaton syndrome, or ALS.  It is also possible that as with any injection, there may be an allergic reaction or no effect from the medication. Reduced effectiveness after repeated injections is sometimes seen and rarely infection at the injection site may occur. All care will be taken to prevent these side effects. If therapy is given over a long time, atrophy and wasting in the muscle injected may occur. Occasionally the patient's become refractory to treatment because they develop antibodies to the toxin. In this event, therapy needs to be modified.  I have read the above information and consent to the administration of botulism toxin.    BOTOX  PROCEDURE NOTE FOR MIGRAINE HEADACHE  Contraindications and precautions discussed with patient(above). Aseptic procedure was observed and patient tolerated procedure. Procedure performed by Greig Forbes, FNP-C.   The condition has existed for more than 6 months, and pt does not have  a diagnosis of ALS, Myasthenia Gravis or Lambert-Eaton Syndrome.  Risks and benefits of injections discussed and pt agrees to proceed with the procedure.  Written consent obtained  These injections are  medically necessary. Pt  receives good benefits from these injections. These injections do not cause sedations or hallucinations which the oral therapies may cause.   Description of procedure:  The patient was placed in a sitting position. The standard protocol was used for Botox  as follows, with 5 units of Botox  injected at each site:  -Procerus muscle, midline injection  -Corrugator muscle, bilateral injection  -Frontalis muscle, bilateral injection, with 2 sites each side, medial injection was performed in the upper one third of the frontalis muscle, in the region vertical from the medial inferior edge of the superior orbital rim. The lateral injection was again in the upper one third of the forehead vertically above the lateral limbus of the cornea, 1.5 cm lateral to the medial injection site.  -Temporalis muscle injection, 4 sites, bilaterally. The first injection was 3 cm above the tragus of the ear, second injection site was 1.5 cm to 3 cm up from the first injection site in line with the tragus of the ear. The third injection site was 1.5-3 cm forward between the first 2 injection sites. The fourth injection site was 1.5 cm posterior to the second injection site. 5th site laterally in the temporalis  muscleat the level of the outer canthus.  -Occipitalis muscle injection, 3 sites, bilaterally. The first injection was done one half way between the occipital protuberance and the tip of the mastoid process behind the ear. The second injection site was done lateral and superior to the first, 1 fingerbreadth from the first injection. The third injection site was 1 fingerbreadth superiorly and medially from the first injection site.  -Cervical paraspinal muscle injection, 2 sites, bilaterally. The first injection site was 1 cm from the midline of the cervical spine, 3 cm inferior to the lower border of the occipital protuberance. The second injection site was 1.5 cm superiorly and laterally to the  first injection site.  -Trapezius muscle injection was performed at 3 sites, bilaterally. The first injection site was in the upper trapezius muscle halfway between the inflection point of the neck, and the acromion. The second injection site was one half way between the acromion and the first injection site. The third injection was done between the first injection site and the inflection point of the neck.   Will return for repeat injection in 3 months.   A total of 200 units of Botox  was prepared, 155 units of Botox  was injected as documented above, any Botox  not injected was wasted. The patient tolerated the procedure well, there were no complications of the above procedure.

## 2024-03-27 ENCOUNTER — Ambulatory Visit: Admitting: Family Medicine

## 2024-03-31 ENCOUNTER — Ambulatory Visit (INDEPENDENT_AMBULATORY_CARE_PROVIDER_SITE_OTHER): Admitting: Family Medicine

## 2024-03-31 VITALS — BP 138/88

## 2024-03-31 DIAGNOSIS — G43711 Chronic migraine without aura, intractable, with status migrainosus: Secondary | ICD-10-CM

## 2024-03-31 DIAGNOSIS — G43E19 Chronic migraine with aura, intractable, without status migrainosus: Secondary | ICD-10-CM

## 2024-03-31 DIAGNOSIS — G43009 Migraine without aura, not intractable, without status migrainosus: Secondary | ICD-10-CM

## 2024-03-31 MED ORDER — ONABOTULINUMTOXINA 200 UNITS IJ SOLR
155.0000 [IU] | Freq: Once | INTRAMUSCULAR | Status: AC
  Administered 2024-03-31: 155 [IU] via INTRAMUSCULAR

## 2024-03-31 MED ORDER — ONABOTULINUMTOXINA 200 UNITS IJ SOLR: 155.0000 [IU] | Freq: Once | INTRAMUSCULAR | Status: DC

## 2024-03-31 NOTE — Progress Notes (Signed)
 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

## 2024-04-01 ENCOUNTER — Ambulatory Visit: Admitting: Family Medicine

## 2024-04-01 ENCOUNTER — Telehealth: Payer: Self-pay | Admitting: Family Medicine

## 2024-04-01 DIAGNOSIS — K219 Gastro-esophageal reflux disease without esophagitis: Secondary | ICD-10-CM | POA: Diagnosis not present

## 2024-04-01 DIAGNOSIS — M797 Fibromyalgia: Secondary | ICD-10-CM | POA: Diagnosis not present

## 2024-04-01 DIAGNOSIS — N3281 Overactive bladder: Secondary | ICD-10-CM | POA: Diagnosis not present

## 2024-04-01 DIAGNOSIS — G43E19 Chronic migraine with aura, intractable, without status migrainosus: Secondary | ICD-10-CM | POA: Diagnosis not present

## 2024-04-01 NOTE — Telephone Encounter (Signed)
 [facility]) Schedule delivery of botulinum toxin Type A  (BOTOX ) 200 units injection on 04/02/24.

## 2024-04-07 DIAGNOSIS — K219 Gastro-esophageal reflux disease without esophagitis: Secondary | ICD-10-CM | POA: Diagnosis not present

## 2024-04-07 DIAGNOSIS — K5909 Other constipation: Secondary | ICD-10-CM | POA: Diagnosis not present

## 2024-04-19 ENCOUNTER — Other Ambulatory Visit: Payer: Self-pay | Admitting: Adult Health

## 2024-04-19 DIAGNOSIS — F331 Major depressive disorder, recurrent, moderate: Secondary | ICD-10-CM

## 2024-04-19 DIAGNOSIS — F411 Generalized anxiety disorder: Secondary | ICD-10-CM

## 2024-04-22 DIAGNOSIS — M797 Fibromyalgia: Secondary | ICD-10-CM | POA: Diagnosis not present

## 2024-04-22 DIAGNOSIS — R7303 Prediabetes: Secondary | ICD-10-CM | POA: Diagnosis not present

## 2024-04-22 DIAGNOSIS — I10 Essential (primary) hypertension: Secondary | ICD-10-CM | POA: Diagnosis not present

## 2024-04-22 DIAGNOSIS — G43119 Migraine with aura, intractable, without status migrainosus: Secondary | ICD-10-CM | POA: Diagnosis not present

## 2024-04-30 ENCOUNTER — Other Ambulatory Visit: Payer: Self-pay

## 2024-04-30 DIAGNOSIS — G43711 Chronic migraine without aura, intractable, with status migrainosus: Secondary | ICD-10-CM

## 2024-04-30 MED ORDER — AJOVY 225 MG/1.5ML ~~LOC~~ SOAJ: 225.0000 mg | SUBCUTANEOUS | 3 refills | Status: AC

## 2024-06-17 ENCOUNTER — Telehealth: Admitting: Adult Health

## 2024-06-17 ENCOUNTER — Encounter: Payer: Self-pay | Admitting: Adult Health

## 2024-06-17 DIAGNOSIS — F331 Major depressive disorder, recurrent, moderate: Secondary | ICD-10-CM

## 2024-06-17 DIAGNOSIS — F419 Anxiety disorder, unspecified: Secondary | ICD-10-CM | POA: Diagnosis not present

## 2024-06-17 DIAGNOSIS — F32A Depression, unspecified: Secondary | ICD-10-CM

## 2024-06-17 DIAGNOSIS — F411 Generalized anxiety disorder: Secondary | ICD-10-CM

## 2024-06-17 MED ORDER — CLONAZEPAM 0.5 MG PO TABS: ORAL_TABLET | ORAL | 2 refills | Status: AC

## 2024-06-17 MED ORDER — DULOXETINE HCL 60 MG PO CPEP: 60.0000 mg | ORAL_CAPSULE | Freq: Two times a day (BID) | ORAL | 0 refills | Status: AC

## 2024-06-17 NOTE — Progress Notes (Signed)
 Misty Ross [id number] 05/03/1991 33 y.o.  Virtual Visit via Video Note  I connected with pt @ on 06/17/24 at 10:00 AM EST by a video enabled telemedicine application and verified that I am speaking with the correct person using two identifiers.   I discussed the limitations of evaluation and management by telemedicine and the availability of in person appointments. The patient expressed understanding and agreed to proceed.  I discussed the assessment and treatment plan with the patient. The patient was provided an opportunity to ask questions and all were answered. The patient agreed with the plan and demonstrated an understanding of the instructions.   The patient was advised to call back or seek an in-person evaluation if the symptoms worsen or if the condition fails to improve as anticipated.  I provided 15 minutes of non-face-to-face time during this encounter.  The patient was located at home.  The provider was located at [facility].   Angeline LOISE Sayers, NP   Subjective:   Patient ID:  Misty Ross is a 34 y.o. (DOB Sep 25, 1990) female.  Chief Complaint: No chief complaint on file.   HPI Cybele Maule presents for follow-up of MDD, GAD and ADD.  Diagnosed with Fibromyalgia 6 months ago - increased pain - sleep issues.   Describes mood today as ok. Pleasant. Denies tearfulness. Mood symptoms - denies depressive symptoms. Reports improved interest and motivation. Reports decreased anxiety - here and there, nothing too bad. Denies recent panic attacks. Denies irritability. Denies worry, rumination and over thinking. Denies obsessive thoughts and acts. Reports mood as stable. Stating I feel like I've been doing ok. Taking medications as prescribed.   Energy levels improving. Active, does not have a regular exercise routine. Enjoys some usual interests and activities. Single. Currently living with parents. Spending time with family and friends.  Appetite adequate.  Reports she recently started on a GLP-1. Diagnosed with pre-diabetes.  Sleeps well most nights. Averages 8 hours. Reports using Clonazepam  some nights to help with sleep. Focus and concentration stable. Reports decreased brain fog associated with Fibromyalgia. Completing tasks. Managing aspects of household. 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 Denies SI or HI.  Denies AH or VH. Denies self harm. Denies substance use x 2 months.  Previous medication trials:   Lexapro Wellbutrin  Zoloft Prozac  Clonazepam  Lamictal Modafinil Gabapentin Buspar Lamictal  Cymbalta  - recently started for fibromyalgia Trazadone    Review of Systems:  Review of Systems  Musculoskeletal:  Negative for gait problem.  Neurological:  Negative for tremors.  Psychiatric/Behavioral:         Please refer to HPI    Medications: I have reviewed the patient's current medications.  Current Outpatient Medications  Medication Sig Dispense Refill   botulinum toxin Type A  (BOTOX ) 200 units injection Provider to inject 155 units into the muscles of the head and neck every 3 months. Discard remainder 1 each 2   clonazePAM  (KLONOPIN ) 0.5 MG tablet TAKE 1 TABLET DAILY AS NEEDED FOR ANXIETY 30 tablet 1   diclofenac (VOLTAREN) 75 MG EC tablet Take 75 mg by mouth 2 (two) times daily as needed.     DULoxetine  (CYMBALTA ) 30 MG capsule TAKE 1 CAPSULE BY MOUTH EVERY DAY 90 capsule 0   DULoxetine  (CYMBALTA ) 60 MG capsule Take 1 capsule (60 mg total) by mouth daily. 30 capsule 2   Fremanezumab -vfrm (AJOVY ) 225 MG/1.5ML SOAJ Inject 225 mg into the skin every 30 (thirty) days. 3 mL 3   metoprolol tartrate (LOPRESSOR) 50 MG  tablet Take 75 mg by mouth 2 (two) times daily.     norethindrone (MICRONOR) 0.35 MG tablet Take 1 tablet by mouth daily.     ondansetron  (ZOFRAN -ODT) 4 MG disintegrating tablet TAKE 1-2 TABLETS (4-8 MG TOTAL) BY MOUTH EVERY 8 (EIGHT) HOURS AS NEEDED. GREAT FOR NAUSEA OR MOTION SICKNESS. CAN ALSO TAKE IT  WITH RIZATRIPTAN  OR NURTEC FOR MIGRAINES 30 tablet 3   pantoprazole  (PROTONIX ) 40 MG tablet Take 40 mg by mouth every morning.     rizatriptan  (MAXALT ) 10 MG tablet Take 1 tablet (10 mg total) by mouth as needed for migraine. May repeat in 2 hours if needed 10 tablet 11   solifenacin (VESICARE) 5 MG tablet Take 5 mg by mouth daily.     Vitamin D, Ergocalciferol, (DRISDOL) 1.25 MG (50000 UNIT) CAPS capsule Take 50,000 Units by mouth once a week.     No current facility-administered medications for this visit.    Medication Side Effects: None  Allergies: Allergies[1]  Past Medical History:  Diagnosis Date   ADHD (attention deficit hyperactivity disorder)    Allergy    Anxiety    Depression    Eczema    Fibromyalgia    Hypertension    Migraine    Seizures (HCC)     Family History  Problem Relation Age of Onset   Hyperlipidemia Father    Migraines Father    Multiple sclerosis Brother    Cancer Maternal Grandmother    Diabetes Maternal Grandmother    Cancer Maternal Grandfather    Heart disease Paternal Grandmother    Cancer Paternal Grandfather     Social History   Socioeconomic History   Marital status: Single    Spouse name: Not on file   Number of children: Not on file   Years of education: Not on file   Highest education level: Not on file  Occupational History   Not on file  Tobacco Use   Smoking status: Former    Current packs/day: 0.50    Types: Cigarettes   Smokeless tobacco: Never  Vaping Use   Vaping status: Every Day   Substances: Nicotine   Substance and Sexual Activity   Alcohol use: Not Currently    Comment: one beer per day   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   Sexual activity: Not on file  Other Topics Concern   Not on file  Social History Narrative   Right handed   Caffeine: 1-2 cups per day    Lives with her family   Social Drivers of Health   Tobacco Use: Medium Risk (03/17/2024)   Patient History    Smoking Tobacco Use:  Former    Smokeless Tobacco Use: Never    Passive Exposure: Not on Actuary Strain: Not on file  Food Insecurity: Not on file  Transportation Needs: Not on file  Physical Activity: Not on file  Stress: Not on file  Social Connections: Not on file  Intimate Partner Violence: Not At Risk (03/31/2023)   Received from [facility]   HITS    Over the last 12 months how often did your partner physically hurt you?: Never    Over the last 12 months how often did your partner insult you or talk down to you?: Never    Over the last 12 months how often did your partner threaten you with physical harm?: Never    Over the last 12 months how often did your partner scream or  curse at you?: Never  Depression (PHQ2-9): Not on file  Alcohol Screen: Not on file  Housing: Not on file  Utilities: Not on file  Health Literacy: Not on file    Past Medical History, Surgical history, Social history, and Family history were reviewed and updated as appropriate.   Please see review of systems for further details on the patient's review from today.   Objective:   Physical Exam:  There were no vitals taken for this visit.  Physical Exam Constitutional:      General: She is not in acute distress. Musculoskeletal:        General: No deformity.  Neurological:     Mental Status: She is alert and oriented to person, place, and time.     Coordination: Coordination normal.  Psychiatric:        Attention and Perception: Attention and perception normal. She does not perceive auditory or visual hallucinations.        Mood and Affect: Mood normal. Mood is not anxious or depressed. Affect is not labile, blunt, angry or inappropriate.        Speech: Speech normal.        Behavior: Behavior normal.        Thought Content: Thought content normal. Thought content is not paranoid or delusional. Thought content does not include homicidal or suicidal ideation. Thought content does not include homicidal  or suicidal plan.        Cognition and Memory: Cognition and memory normal.        Judgment: Judgment normal.     Comments: Insight intact     Lab Review:     Component Value Date/Time   NA 138 02/17/2023 1333   K 4.2 02/17/2023 1333   CL 105 02/17/2023 1333   CO2 28 02/17/2023 1333   GLUCOSE 98 02/17/2023 1333   BUN 15 02/17/2023 1333   CREATININE 1.00 02/17/2023 1333   CALCIUM 8.7 (L) 02/17/2023 1333   PROT 5.3 (L) 02/17/2023 1333   ALBUMIN 3.5 02/17/2023 1333   AST 36 02/17/2023 1333   ALT 28 02/17/2023 1333   ALKPHOS 30 (L) 02/17/2023 1333   BILITOT 0.3 02/17/2023 1333   GFRNONAA >60 02/17/2023 1333   GFRAA >90 07/15/2014 2228       Component Value Date/Time   WBC 4.8 02/17/2023 1333   RBC 3.87 02/17/2023 1333   HGB 11.3 (L) 02/17/2023 1333   HCT 34.0 (L) 02/17/2023 1333   PLT 242 02/17/2023 1333   MCV 87.9 02/17/2023 1333   MCH 29.2 02/17/2023 1333   MCHC 33.2 02/17/2023 1333   RDW 12.2 02/17/2023 1333   LYMPHSABS 1.4 02/17/2023 1333   MONOABS 0.6 02/17/2023 1333   EOSABS 0.2 02/17/2023 1333   BASOSABS 0.0 02/17/2023 1333    No results found for: POCLITH, LITHIUM   No results found for: PHENYTOIN, PHENOBARB, VALPROATE, CBMZ   .res Assessment: Plan:    Treatment Plan/Recommendations:   Plan:  PDMP reviewed  Continue: Cymbalta  60mg  daily. Clonazepam  0.5mg  daily as needed for sleep and anxiety attacks. Has tried multiple medications and feels the Clonazepam  works well.  Change: Increase Cymbalta  60/30mg  to 60/60 daily  15 minutes spent dedicated to the care of this patient on the date of this encounter to include pre-visit review of records, ordering of medication, post visit documentation, and face-to-face time with the patient discussing depression, and anxiety. Discussed continuing current medication regimen.  RTC 3 months  Patient advised to contact office with any questions,  adverse effects, or acute worsening in signs and  symptoms. There are no diagnoses linked to this encounter.   Please see After Visit Summary for patient specific instructions.  Future Appointments  Date Time Provider Department Center  06/17/2024 10:00 AM Rucker Pridgeon Nattalie, NP [facility] None  06/30/2024  8:30 AM Lomax, Amy, NP [facility] None    No orders of the defined types were placed in this encounter.     -------------------------------      [1]  Allergies Allergen Reactions   Benadryl  [Diphenhydramine ]     Feels like skin is crawling

## 2024-06-26 ENCOUNTER — Encounter: Payer: Self-pay | Admitting: Family Medicine

## 2024-06-30 ENCOUNTER — Ambulatory Visit: Admitting: Family Medicine

## 2024-06-30 DIAGNOSIS — G43711 Chronic migraine without aura, intractable, with status migrainosus: Secondary | ICD-10-CM

## 2024-07-01 ENCOUNTER — Ambulatory Visit: Admitting: Family Medicine

## 2024-09-15 ENCOUNTER — Telehealth: Admitting: Adult Health
